# Patient Record
Sex: Male | Born: 1975
Health system: Southern US, Community
[De-identification: ages and names within clinical notes are randomized; demographics above are authoritative.]

## PROBLEM LIST (undated history)

## (undated) DIAGNOSIS — F909 Attention-deficit hyperactivity disorder, unspecified type: Secondary | ICD-10-CM

## (undated) DIAGNOSIS — N529 Male erectile dysfunction, unspecified: Secondary | ICD-10-CM

## (undated) DIAGNOSIS — F341 Dysthymic disorder: Secondary | ICD-10-CM

## (undated) DIAGNOSIS — Z860100 Personal history of colon polyps, unspecified: Secondary | ICD-10-CM

## (undated) DIAGNOSIS — G4730 Sleep apnea, unspecified: Secondary | ICD-10-CM

## (undated) DIAGNOSIS — T7840XA Allergy, unspecified, initial encounter: Secondary | ICD-10-CM

## (undated) HISTORY — DX: Attention-deficit hyperactivity disorder, unspecified type: F90.9

## (undated) HISTORY — DX: Allergy, unspecified, initial encounter: T78.40XA

## (undated) HISTORY — DX: Personal history of colon polyps, unspecified: Z86.0100

## (undated) HISTORY — PX: UMBILICAL HERNIA REPAIR: SHX196

## (undated) HISTORY — DX: Sleep apnea, unspecified: G47.30

## (undated) HISTORY — DX: Male erectile dysfunction, unspecified: N52.9

## (undated) HISTORY — DX: Personal history of colonic polyps: Z86.010

## (undated) HISTORY — PX: HERNIA REPAIR: SHX51

## (undated) HISTORY — DX: Dysthymic disorder: F34.1

---

## 2005-09-05 ENCOUNTER — Encounter: Admission: RE | Admit: 2005-09-05 | Discharge: 2005-09-05 | Payer: Self-pay | Admitting: Emergency Medicine

## 2006-10-08 ENCOUNTER — Emergency Department (HOSPITAL_COMMUNITY): Admission: EM | Admit: 2006-10-08 | Discharge: 2006-10-08 | Payer: Self-pay | Admitting: Emergency Medicine

## 2006-10-15 ENCOUNTER — Ambulatory Visit: Payer: Self-pay | Admitting: Internal Medicine

## 2006-11-29 ENCOUNTER — Encounter (INDEPENDENT_AMBULATORY_CARE_PROVIDER_SITE_OTHER): Payer: Self-pay | Admitting: Specialist

## 2006-11-29 ENCOUNTER — Ambulatory Visit: Payer: Self-pay | Admitting: Internal Medicine

## 2006-11-29 HISTORY — PX: COLONOSCOPY: SHX174

## 2007-01-08 ENCOUNTER — Ambulatory Visit: Payer: Self-pay | Admitting: Internal Medicine

## 2009-10-21 ENCOUNTER — Emergency Department (HOSPITAL_COMMUNITY): Admission: EM | Admit: 2009-10-21 | Discharge: 2009-10-21 | Payer: Self-pay | Admitting: Family Medicine

## 2009-12-27 ENCOUNTER — Ambulatory Visit: Payer: Self-pay | Admitting: Internal Medicine

## 2009-12-27 ENCOUNTER — Telehealth: Payer: Self-pay | Admitting: Internal Medicine

## 2009-12-27 DIAGNOSIS — K625 Hemorrhage of anus and rectum: Secondary | ICD-10-CM | POA: Insufficient documentation

## 2009-12-27 DIAGNOSIS — K648 Other hemorrhoids: Secondary | ICD-10-CM | POA: Insufficient documentation

## 2010-12-08 NOTE — Progress Notes (Signed)
Summary: triage   Phone Note Call from Patient Call back at Work Phone [phone]   Caller: Patient Call For: [name] Summary of Call: Patient has sharp pains in his stomach and blood in his stools for about a week , does not wants to wait unitil next available 3-28 Initial call taken by: Tawni Levy,  December 27, 2009 1:40 PM  Follow-up for Phone Call        PT WITH 1  1/2 WQEEK HX OF ABDOMINAL PAIN AND BLOOD IN STOOL.  pT WILL COME IN TODAY AT 3:00 TO SEE AMY. Follow-up by: Darcey Nora RN, CGRN,  December 27, 2009 1:44 PM

## 2010-12-08 NOTE — Procedures (Signed)
Summary: Colonoscopy   Colonoscopy  Procedure date:  11/29/2006  Findings:      Results: Hemorrhoids.     Location:  [facility].    Patient Name: Carl Beltran, Carl Beltran. MRN:  Procedure Procedures: Colonoscopy CPT: [id number].    with biopsy. CPT: [id number].  Personnel: Endoscopist: Iva Boop, MD, [facility].  Referred By: Reuben Likes, MD.  Exam Location: Exam performed in Outpatient Clinic. Outpatient  Patient Consent: Procedure, Alternatives, Risks and Benefits discussed, consent obtained, from patient. Consent was obtained by the RN.  Indications  Abnormal Exams, Studies: CT scan, abnormal.  History  Current Medications: Patient is not currently taking Coumadin.  Allergies: No known allergies.  Comments: Inflammatory changes of terminal ileum on CT. Long-standing diagnosis of IBS. ? IBD based upon CT Pre-Exam Physical: Performed Nov 29, 2006. Cardio-pulmonary exam, Rectal exam, HEENT exam , Abdominal exam, Mental status exam WNL.  Comments: Pt. history reviewed/updated, physical exam performed prior to initiation of sedation? YES Exam Exam: Extent of exam reached: Terminal Ileum, extent intended: Terminal Ileum.  The cecum was identified by appendiceal orifice and IC valve. Patient position: on left side. Time to Cecum: 00:02:40. Time for Withdrawl: 00:06:20. Colon retroflexion performed. Images taken. ASA Classification: I. Tolerance: excellent.  Monitoring: Pulse and BP monitoring, Oximetry used. Supplemental O2 given.  Colon Prep Used MiraLax for colon prep. Prep results: good.  Sedation Meds: Patient assessed and found to be appropriate for moderate (conscious) sedation. Fentanyl 75 mcg. given IV. Versed 8 mg. given IV.  Findings NORMAL EXAM: Terminal Ileum. Biopsy/Normal Exam taken.  - NORMAL EXAM: Cecum to Sigmoid Colon.  HEMORRHOIDS: Internal. Size: Grade I. ICD9: Hemorrhoids, Internal: 455.0.   Assessment  Diagnoses: 455.0:  Hemorrhoids, Internal.   Comments: 1) NORMAL COLON AND TERMINAL ILEUM 2) SMALL INTERNAL HEMORRHOIDS 3) GOOD PREP   Events  Unplanned Interventions: No intervention was required.  Plans Comments: TRY DICYCLOMINE 20 MG BEFORE MEALS TO PREVENT POST-PRANDIAL DIARRHEA Disposition: After procedure patient sent to recovery. After recovery patient sent home.  Scheduling/Referral: Clinic Visit, to Iva Boop, MD, [facility], 1 MONTH,  Path Letter, to The Patient,    cc:   Roston Grunewald  This report was created from the original endoscopy report, which was reviewed and signed by the above listed endoscopist.

## 2010-12-08 NOTE — Assessment & Plan Note (Signed)
Summary: ABDOMINAL PAIN AND RECTAL BLEEDING/Carl Beltran    History of Present Illness Visit Type: Follow-up Visit Primary GI MD: [name] MD [facility] Primary Provider: n/a Chief Complaint: Abdominal pain x 1 week, rectal bleed significant amount today History of Present Illness:   35  YO MALE KNOWN TO DR. Leone Beltran WITH HX OF IBS. HE UNDERWENT COLONOSCOPY IN 1/08 BECAUSE OF AN ABNORMAL CT SCAN SUGGESTING ILEITIS. COLONOSCOPY WAS NORMAL-BX OF TERMINAL ILEUM NORMAL,HE DID HAVE INTERNAL HEMORRHOIDS. HE COMES IN TODAY STATING HE SOME SOME SHARP ABDOMINAL PAINS LAST WEEK WHICH HAS NOW RESOLVED-THE PAIN WAS SIMILAR TO WHAT HE HAS HAD IN THE PAST. YESTERDAY HE NOTED SOME BRB AND DARK BLOOD ON THE TISSUE WITH A BM. HE HAS SEEN SOME BLOOD WITH EACH BM SINCE. HIS STOOL IS NORMAL,BUT MIXED WITH SOME DARK RED BLOOD. HE DENIES ANY ABDOMINAL PAIN,NO RECTAL PAIN OR DISCOMFORT.HE IS WORRIED BECAUSE  A FRIENDS WIFE HAS DIED FROM COLON CANCER,AND HE THOUGHT HE MIGHT NEED ANOTHER COLONOSCOPY.   GI Review of Systems    Reports abdominal pain.     Location of  Abdominal pain: lower abdomen.    Denies acid reflux, belching, bloating, chest pain, dysphagia with liquids, dysphagia with solids, heartburn, loss of appetite, nausea, vomiting, vomiting blood, weight loss, and  weight gain.      Reports black tarry stools and  rectal bleeding.     Denies anal fissure, change in bowel habit, constipation, diarrhea, diverticulosis, fecal incontinence, heme positive stool, hemorrhoids, irritable bowel syndrome, jaundice, light color stool, liver problems, and  rectal pain. Preventive Screening-Counseling & Management  Alcohol-Tobacco     Smoking Status: quit  Caffeine-Diet-Exercise     Does Patient Exercise: yes      Drug Use:  no.      Current Medications (verified): 1)  None  Allergies (verified): No Known Drug Allergies  Past History:  Past Medical History: IBS INTERNAL HEMORRHOIDS  Past Surgical  History: Hernia Surgery ABDOMINAL WALL AS CHILD  Family History: No FH of Colon Cancer:  Social History: Patient is a former smoker.  Alcohol Use - yes Illicit Drug Use - no Patient gets regular exercise. Smoking Status:  quit Drug Use:  no Does Patient Exercise:  yes  Review of Systems       The patient complains of allergy/sinus, back pain, and headaches-new.  The patient denies anemia, anxiety-new, arthritis/joint pain, blood in urine, breast changes/lumps, change in vision, confusion, cough, coughing up blood, depression-new, fainting, fatigue, fever, hearing problems, heart murmur, heart rhythm changes, itching, menstrual pain, muscle pains/cramps, night sweats, nosebleeds, pregnancy symptoms, shortness of breath, skin rash, sleeping problems, sore throat, swelling of feet/legs, swollen lymph glands, thirst - excessive , urination - excessive , urination changes/pain, urine leakage, vision changes, and voice change.         ROS OTHERWISE AS IN HPI  Vital Signs:  Patient profile:   35 year old male Height:      70 inches Weight:      216.50 pounds BMI:     31.18 Pulse rate:   80 / minute Pulse rhythm:   regular BP sitting:   90 / 60  (left arm) Cuff size:   regular  Vitals Entered By: June McMurray CMA Duncan Dull) (December 27, 2009 2:51 PM)  Physical Exam  General:  Well developed, well nourished, no acute distress. Head:  Normocephalic and atraumatic. Eyes:  PERRLA, no icterus. Lungs:  Clear throughout to auscultation. Heart:  Regular rate and rhythm; no murmurs,  rubs,  or bruits. Abdomen:  SOFT, NONTENDER, NO MASS OR HSM,BS+ Rectal:  PT DECLINED Extremities:  No clubbing, cyanosis, edema or deformities noted. Neurologic:  Alert and  oriented x4;  grossly normal neurologically. Psych:  Alert and cooperative. Normal mood and affect.   Impression & Recommendations:  Problem # 1:  RECTAL BLEEDING (ICD-569.3) Assessment New 35 YO MALE WITH ONE DAY HX OF RECTAL  BLEEDING-MOST CONSISTENT WITH HEMORRHOIDAL BLEEDING. HE HAD A NEGATIVE COLONOSCOPY  3 YEARS AGO.  DISCUSSED WITH PT,HE DID NOT WANT TO UNDERGO RECTAL EXAM TODAY;WILL START ANUSOL HC SUPP at bedtime X ONE WEEK THEN AS NEEDED FOLLOW UP WITH DR. Leone Beltran IN 2-3 WEEKS,IF BLEEDING PERSISTS WILL CONSIDER FURTHER WORKUP.  Problem # 2:  IRRITABLE BOWEL SYNDROME (ICD-564.1) Assessment: Comment Only  Patient Instructions: 1)  We sent a perscription for Anusol HC Suppositories to [facility] 2)  Hemorrhoids brochure given.  3)  We made you a follow up appointment  with Dr. Leone Beltran for 01-13-10 at 2:30 PM.  4)  The medication list was reviewed and reconciled.  All changed / newly prescribed medications were explained.  A complete medication list was provided to the patient / caregiver. Prescriptions: ANUSOL-HC 25 MG SUPP (HYDROCORTISONE ACETATE) Use 1 suppository at bedtime for 1 week then as needed  #10 x 1   Entered by:   Lowry Ram NCMA   Authorized by:   Sammuel Cooper PA-c   Signed by:   Lowry Ram NCMA on 12/27/2009   Method used:   Electronically to        [facility] (retail)       [address]       [place], [place]  [address]       Ph: [phone]       Fax: [phone]   RxID:   [id number]

## 2011-03-24 NOTE — Assessment & Plan Note (Signed)
HEALTHCARE                         GASTROENTEROLOGY OFFICE NOTE   Carl Beltran, Carl Beltran                      MRN:          [id number]  DATE:10/15/2006                            DOB:          11/08/1975    CONSULTATION:   REASON FOR CONSULTATION:  Abdominal pain, abnormal ileum on CT scan.   ASSESSMENT:  This is a 35 year old white man that had developed acute  abdominal pain on December 3.  This is after returning from a trip to  the [place].  It was a very sharp, shooting infraumbilical pain and  bilateral lower quadrant pain.  He did have some diarrhea.  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  He had mild hepatomegaly and a spleen the upper  limits of normal.  There was some sigmoid wall thickening and a small  amount of pelvic free fluid as well.  The sigmoid wall thickening was  thought perhaps due to underdistention.  He also has years of history of  intermittent abdominal pain diagnosed as irritable bowel syndrome at the  [facility], [place], when he was a youngster.  He did have a  double hernia repair as a child.  Initially they thought this was  related to adhesions but eventually was diagnosed as irritable bowel  syndrome.  There is no chronic diarrhea or chronic pain other than this  episodic intermittent pain that has happened over the years.  This is  one of the worst episodes, and it had been some time since he had this.   I think this could be Crohn disease.  It could have been an infectious  process or perhaps some process related to adhesions or perhaps a  partial obstructive-type picture, though we did not have dilated loops  of bowel reported.   PLAN:  1. He has been started on empiric prednisone, and we will taper that      over the next 6 days.  2. Schedule colonoscopy to  investigate.  He cannot really do this      until the first of the year, and we will set this up.  Hopefully,      he will do well in the interim, but he understands there is a risk      of recurrent problems.  3. Further plans pending that, a small bowel series versus capsule      endoscopy could be indicated as well.   HISTORY:  This is a pleasant 35 year old white man with problems as  described above.  He had been doing reasonably well until his trip to  the [place] and then when he returned, he was awakened from sleep with  acute severe abdominal pain that lasted for hours, so he presented to  the emergency room as described above.  He did not have a fever there,  his white count was 12.9, his hemoglobin was 15.  He did have somewhat  of a left shift.  Platelets were normal.  His urinalysis was  negative.  CT findings as above.  He does not have bleeding or chronic diarrhea.  He went to Dr. Lorenz Coaster, who described prednisone, and he is really almost  back to normal at this point.   MEDICATIONS:  1. Prednisone 4 pills a day, not sure of the exact dose.  2. Vicodin p.r.n.  3. Tylenol p.r.n.   DRUG ALLERGIES:  None known.   PAST MEDICAL HISTORY:  1. Double hernia repair as a child.  2. Irritable bowel syndrome as described above.   FAMILY HISTORY:  No inflammatory bowel disease or GI problems.   SOCIAL HISTORY:  He is married and lives with his wife.  He is a  Teacher, early years/pre.  He has one son.  Quit smoking 8 years ago,  social alcohol.   REVIEW OF SYSTEMS:  Otherwise negative.   PHYSICAL EXAMINATION:  GENERAL:  A young white man, obese, in no acute  distress.  VITAL SIGNS:  Height 5 feet 11 inches, 217 pounds.  Blood pressure  112/68, pulse 85 and regular.  HEENT:  Eyes anicteric.  ENT:  Normal mouth and pharynx.  NECK:  Supple without thyromegaly or mass.  CHEST:  Clear.  HEART:  S1, S2, no murmurs or gallops.  ABDOMEN:  Obese, soft, nontender, no  organomegaly or mass.  He does have  some mild pressure sensation when I depress the right lower quadrant,  and that is where most of his pain was the other night.  RECTAL:  Deferred.  LYMPHATIC:  No neck, supraclavicular or groin adenopathy.  EXTREMITIES:  No peripheral edema.  SKIN:  Warm and dry, no rash.  NEUROLOGIC/PSYCHIATRIC:  Alert and oriented x3.   I appreciate the opportunity to care for this patient.     Iva Boop, MD,FACG  Electronically Signed    CEG/MedQ  DD: 10/15/2006  DT: 10/16/2006  Job #: [id number]   cc:   Reuben Likes, M.D.

## 2011-03-24 NOTE — Assessment & Plan Note (Signed)
[facility]                         GASTROENTEROLOGY OFFICE NOTE   DENSEL, KRONICK                      MRN:          [id number]  DATE:01/08/2007                            DOB:          02-Jun-1976    CHIEF COMPLAINT:  Followup of diarrhea, abnormal terminal ileum.   His colonoscopy was normal.  Small bowel biopsies were normal.  He has  had no recurrent diarrhea.  He has not needed dicyclomine.  I am  assuming he had some sort of infectious problem.  He does have a history  of underlying irritable bowel syndrome as well.   Today's weight 221 pounds, pulse 88, blood pressure 108/68.   ASSESSMENT:  Previous abnormal ileum on CT scan, probably related to an  infection that has come and gone with underlying irritable bowel  syndrome, his problems are quiescent at this time.   PLAN:  Follow up as needed.     Iva Boop, MD,FACG  Electronically Signed    CEG/MedQ  DD: 01/08/2007  DT: 01/08/2007  Job #: [id number]   cc:   Reuben Likes, M.D.

## 2012-01-15 ENCOUNTER — Emergency Department (HOSPITAL_COMMUNITY): Payer: BC Managed Care – PPO

## 2012-01-15 ENCOUNTER — Emergency Department (HOSPITAL_COMMUNITY)
Admission: EM | Admit: 2012-01-15 | Discharge: 2012-01-15 | Disposition: A | Discharge status: Home Health | Payer: BC Managed Care – PPO | Attending: Emergency Medicine | Admitting: Emergency Medicine

## 2012-01-15 ENCOUNTER — Encounter (HOSPITAL_COMMUNITY): Payer: Self-pay | Admitting: Emergency Medicine

## 2012-01-15 DIAGNOSIS — E669 Obesity, unspecified: Secondary | ICD-10-CM | POA: Insufficient documentation

## 2012-01-15 DIAGNOSIS — R1110 Vomiting, unspecified: Secondary | ICD-10-CM | POA: Insufficient documentation

## 2012-01-15 DIAGNOSIS — R10811 Right upper quadrant abdominal tenderness: Secondary | ICD-10-CM | POA: Insufficient documentation

## 2012-01-15 DIAGNOSIS — R197 Diarrhea, unspecified: Secondary | ICD-10-CM | POA: Insufficient documentation

## 2012-01-15 DIAGNOSIS — R109 Unspecified abdominal pain: Secondary | ICD-10-CM | POA: Insufficient documentation

## 2012-01-15 LAB — LIPASE, BLOOD: Lipase: 24 U/L (ref 11–59)

## 2012-01-15 LAB — CBC
HCT: 45.8 % (ref 39.0–52.0)
Hemoglobin: 16.5 g/dL (ref 13.0–17.0)
MCHC: 36 g/dL (ref 30.0–36.0)
Platelets: 357 10*3/uL (ref 150–400)
RDW: 12.6 % (ref 11.5–15.5)

## 2012-01-15 LAB — URINALYSIS, ROUTINE W REFLEX MICROSCOPIC
Glucose, UA: NEGATIVE mg/dL
Hgb urine dipstick: NEGATIVE
Leukocytes, UA: NEGATIVE
Nitrite: NEGATIVE
Specific Gravity, Urine: 1.028 (ref 1.005–1.030)
Urobilinogen, UA: 1 mg/dL (ref 0.0–1.0)
pH: 7.5 (ref 5.0–8.0)

## 2012-01-15 LAB — COMPREHENSIVE METABOLIC PANEL
ALT: 38 U/L (ref 0–53)
Albumin: 4.4 g/dL (ref 3.5–5.2)
Alkaline Phosphatase: 86 U/L (ref 39–117)
BUN: 13 mg/dL (ref 6–23)
Chloride: 100 mEq/L (ref 96–112)
GFR calc Af Amer: >90 mL/min (ref >90)
Glucose, Bld: 168 mg/dL — ABNORMAL HIGH (ref 70–99)
Potassium: 4.6 mEq/L (ref 3.5–5.1)
Sodium: 139 mEq/L (ref 135–145)
Total Bilirubin: 0.6 mg/dL (ref 0.3–1.2)
Total Protein: 8.2 g/dL (ref 6.0–8.3)

## 2012-01-15 LAB — DIFFERENTIAL
Basophils Absolute: 0 10*3/uL (ref 0.0–0.1)
Basophils Relative: 0 % (ref 0–1)
Eosinophils Absolute: 0.1 10*3/uL (ref 0.0–0.7)
Lymphocytes Relative: 15 % (ref 12–46)
Lymphs Abs: 2.4 10*3/uL (ref 0.7–4.0)
Monocytes Relative: 5 % (ref 3–12)
Neutro Abs: 12.4 10*3/uL — ABNORMAL HIGH (ref 1.7–7.7)
Neutrophils Relative %: 79 % — ABNORMAL HIGH (ref 43–77)

## 2012-01-15 MED ORDER — SODIUM CHLORIDE 0.9 % IV BOLUS (SEPSIS)
1000.0000 mL | Freq: Once | INTRAVENOUS | Status: AC
  Administered 2012-01-15: 1000 mL via INTRAVENOUS

## 2012-01-15 NOTE — ED Notes (Signed)
Pt remains in u/s at this time

## 2012-01-15 NOTE — Discharge Instructions (Signed)
Abdominal Pain Many things can cause belly (abdominal) pain. Most times, the belly pain is not dangerous. The amount of belly pain does not tell how serious the problem may be. Many cases of belly pain can be watched and treated at home. HOME CARE   Do not take medicines that help you go poop (laxatives) unless told to by your doctor.   Only take medicine as told by your doctor.   Eat or drink as told by your doctor. Your doctor will tell you if you should be on a special diet.  GET HELP RIGHT AWAY IF:   The pain does not go away.   You have a fever.   You keep throwing up (vomiting).   The pain changes and is only in the right or left part of the belly.   You have bloody or tarry looking poop.  MAKE SURE YOU:   Understand these instructions.   Will watch your condition.   Will get help right away if you are not doing well or get worse.  Document Released: 04/10/2008 Document Revised: 10/12/2011 Document Reviewed: 11/08/2009 [facility] Patient Information 2012 [place], [place].  Stay on clear liquids for the next 24 hours. Return if your pain worsens within the next 8-12 hours. Appendicitis is a remote possibility, though doubtful. Your blood sugar is high today at 168. It should be rechecked within the next week after fasting overnight. You may be borderline diabetic. Return if her condition worsens for any reason

## 2012-01-15 NOTE — ED Notes (Signed)
PT. REPORTS RIGHT ABDOMINAL PAIN WITH VOMITTING AND DIARRHEA ONSET 7 PM LAST NIGHT.

## 2012-01-15 NOTE — ED Notes (Signed)
Patient currently resting quietly in bed; no respiratory or acute distress noted.  Patient updated on plan of care; informed patient that we are currently waiting to be transported to ultrasound.  Patient has no other questions or concerns at this time; patient denies need for pain medication at this time.  Will continue to monitor.

## 2012-01-15 NOTE — ED Provider Notes (Signed)
History     CSN: [id number]  Arrival date & time 01/15/12  0451   First MD Initiated Contact with Patient 01/15/12 0536      Chief Complaint  Patient presents with  . Abdominal Pain    (Consider location/radiation/quality/duration/timing/severity/associated sxs/prior treatment) HPI  History reviewed. No pertinent past medical history. Complains of right-sided abdominal pain onset 7 PM yesterday pain is intermittent lasting 2 minutes at a time. Patient has had one episode of vomiting and one episode of diarrhea since onset of the pain. He is presently asymptomatic. Pain is sharp nonradiating. Pain similar pain he had approximately 20 years ago when he was diagnosed with gastroenteritis. No treatment prior to coming here no other complaint. Nothing makes symptoms better or worse. No other associated symptoms. Pain is severe when it occurs. Pain-free now Past Surgical History  Procedure Date  . Hernia repair     No family history on file.  History  Substance Use Topics  . Smoking status: Never Smoker   . Smokeless tobacco: Not on file  . Alcohol Use: Yes      Review of Systems  Constitutional: Negative.   HENT: Negative.   Respiratory: Negative.   Cardiovascular: Negative.   Gastrointestinal: Positive for vomiting, abdominal pain and diarrhea.  Musculoskeletal: Negative.   Skin: Negative.   Neurological: Negative.   Hematological: Negative.   Psychiatric/Behavioral: Negative.   All other systems reviewed and are negative.    Allergies  Review of patient's allergies indicates no known allergies.  Home Medications  No current outpatient prescriptions on file.  BP 120/71  Pulse 113  Temp(Src) 98.4 F (36.9 C) (Axillary)  Resp 20  SpO2 100%  Physical Exam  Nursing note and vitals reviewed. Constitutional: He appears well-developed and well-nourished.  HENT:  Head: Normocephalic and atraumatic.  Eyes: Conjunctivae are normal. Pupils are equal, round, and  reactive to light.  Neck: Neck supple. No tracheal deviation present. No thyromegaly present.  Cardiovascular: Normal rate and regular rhythm.   No murmur heard. Pulmonary/Chest: Effort normal and breath sounds normal.  Abdominal: Soft. Bowel sounds are normal. He exhibits no distension and no mass. There is tenderness. There is no rebound and no guarding.       Mildly obese, right upper quadrant tenderness negative Murphy sign  Genitourinary: Penis normal.  Musculoskeletal: Normal range of motion. He exhibits no edema and no tenderness.  Neurological: He is alert. Coordination normal.  Skin: Skin is warm and dry. No rash noted.  Psychiatric: He has a normal mood and affect.    ED Course  Procedures (including critical care time)  Labs Reviewed - No data to display No results found.   No diagnosis found. 8:50 AM patient asymptomatic pain-free.   MDM  Abdominal pain felt to be nonspecific in etiology Plan clear liquid diet for 24 hours Blood sugar recheck Diagnosis #1 nonspecific abdominal pain #2 nausea and vomiting and diarrhea #3 hyperglycemia        Doug Sou, MD 01/15/12 [phone]

## 2012-01-15 NOTE — ED Notes (Signed)
EDP in room with patient.  Patient reports pain is more in the RUQ with palpation, not RLQ.

## 2012-01-15 NOTE — ED Notes (Signed)
Lab at bedside

## 2012-01-15 NOTE — ED Notes (Signed)
Patient presents with RLQ pain starting approximately 1900 last night.  Patient also complaining of nausea, vomiting and diarrhea since then.  Patient has vomiting x 2 and diarrhea x 1.  Patient denies any nausea during assessment but states if he sits up or moves around, the nausea returns.  Patient rating pain at a 1 right now but states it comes and goes and has gotten to a 10 at certain points.  Patient also has rebound tenderness in the RLQ.

## 2012-05-15 ENCOUNTER — Other Ambulatory Visit: Payer: Self-pay | Admitting: Dermatology

## 2012-06-28 ENCOUNTER — Other Ambulatory Visit: Payer: Self-pay | Admitting: Dermatology

## 2013-07-28 ENCOUNTER — Encounter: Payer: Self-pay | Admitting: Internal Medicine

## 2013-07-28 ENCOUNTER — Ambulatory Visit (INDEPENDENT_AMBULATORY_CARE_PROVIDER_SITE_OTHER): Payer: BC Managed Care – PPO | Admitting: Internal Medicine

## 2013-07-28 VITALS — BP 113/74 | HR 72 | Temp 97.9°F | Ht 69.9 in | Wt 217.4 lb

## 2013-07-28 DIAGNOSIS — N529 Male erectile dysfunction, unspecified: Secondary | ICD-10-CM

## 2013-07-28 DIAGNOSIS — Z13220 Encounter for screening for lipoid disorders: Secondary | ICD-10-CM

## 2013-07-28 LAB — BASIC METABOLIC PANEL
BUN: 11 mg/dL (ref 6–23)
CO2: 30 mEq/L (ref 19–32)
Calcium: 9.5 mg/dL (ref 8.4–10.5)
Chloride: 104 mEq/L (ref 96–112)
Creatinine, Ser: 0.9 mg/dL (ref 0.4–1.5)
GFR: 95.88 mL/min (ref >60.00)
Glucose, Bld: 98 mg/dL (ref 70–99)
Potassium: 4.3 mEq/L (ref 3.5–5.1)
Sodium: 138 mEq/L (ref 135–145)

## 2013-07-28 LAB — LIPID PANEL
Cholesterol: 118 mg/dL (ref 0–200)
LDL Cholesterol: 57 mg/dL (ref 0–99)
Total CHOL/HDL Ratio: 3
Triglycerides: 131 mg/dL (ref 0.0–149.0)
VLDL: 26.2 mg/dL (ref 0.0–40.0)

## 2013-07-28 LAB — TSH: TSH: 0.97 u[IU]/mL (ref 0.35–5.50)

## 2013-07-28 LAB — HEMOGLOBIN A1C: Hgb A1c MFr Bld: 5.4 % (ref 4.6–6.5)

## 2013-07-28 MED ORDER — VARDENAFIL HCL 20 MG PO TABS: 10.0000 mg | ORAL_TABLET | Freq: Every day | ORAL | Status: DC | PRN

## 2013-07-28 NOTE — Progress Notes (Signed)
  Subjective:    Patient ID: Carl Beltran, male    DOB: [birth year], 37 y.o.   MRN: [id number]  HPI New patient, here to get established, also reports several months history of ED. Having decreased quality of erections, they don't last as long. 4 months ago he decided to exercise more and has lost 25 pounds thinking that would help but it didn't. A few years ago, he tried Viagra with no side effects and it did help.  Past Medical History  Diagnosis Date  . Erectile dysfunction    Past Surgical History  Procedure Laterality Date  . Hernia repair      umbilical, at age 89 y/o   History   Social History  . Marital Status: Married    Spouse Name: N/A    Number of Children: 2  . Years of Education: N/A   Occupational History  . IT    Social History Main Topics  . Smoking status: Never Smoker   . Smokeless tobacco: Never Used  . Alcohol Use: Yes     Comment: socially   . Drug Use: No  . Sexual Activity: Not on file   Other Topics Concern  . Not on file   Social History Narrative  . No narrative on file   Family History  Problem Relation Age of Onset  . Colon cancer Neg Hx   . Prostate cancer Neg Hx   . Diabetes Neg Hx   . CAD Other     GF    Review of Systems Libido is normal. Not taking any new medication. Stress is at baseline, he has a very good relationship with his wife. No chest pain, shortness of breath, palpitations or claudication.     Objective:   Physical Exam BP 113/74  Pulse 72  Temp(Src) 97.9 F (36.6 C)  Ht 5' 9.9" (1.775 m)  Wt 217 lb 6.4 oz (98.612 kg)  BMI 31.3 kg/m2  SpO2 99%  General -- alert, well-developed, NAD.  Neck --no thyromegaly , normal carotid pulse  Lungs -- normal respiratory effort, no intercostal retractions, no accessory muscle use, and normal breath sounds.  Heart-- normal rate, regular rhythm, no murmur.  Abdomen-- Not distended, good bowel sounds,soft, non-tender. GU-- Scrotal contents normal, penis normal, no  inguinal hernias. Extremities-- no pretibial edema bilaterally   Psych-- Cognition and judgment appear intact. Cooperative with normal attention span and concentration. No anxious appearing , no depressed appearing.        Assessment & Plan:

## 2013-07-28 NOTE — Assessment & Plan Note (Signed)
ED of unclear etiology, no evidence of cardiovascular disease by history and physical. Will screen for high cholesterol, hypogonadism, diabetes. In the past he tried Viagra, good results. Will try Levitra at this time, samples provided #4 tablets and a prescription. If at the end of the trial he prefers Viagra he will let me know. Recommend to come back in 6 months for a formal physical

## 2013-07-29 LAB — TESTOSTERONE, FREE, TOTAL, SHBG
Sex Hormone Binding: 22 nmol/L (ref 13–71)
Testosterone, Free: 80.4 pg/mL (ref 47.0–244.0)
Testosterone-% Free: 2.5 % (ref 1.6–2.9)
Testosterone: 327 ng/dL (ref 300–890)

## 2013-07-31 ENCOUNTER — Encounter: Payer: Self-pay | Admitting: General Practice

## 2013-08-22 ENCOUNTER — Ambulatory Visit (INDEPENDENT_AMBULATORY_CARE_PROVIDER_SITE_OTHER): Payer: BC Managed Care – PPO | Admitting: Internal Medicine

## 2013-08-22 ENCOUNTER — Encounter: Payer: Self-pay | Admitting: Internal Medicine

## 2013-08-22 VITALS — BP 115/76 | HR 62 | Temp 97.8°F | Wt 212.0 lb

## 2013-08-22 DIAGNOSIS — J069 Acute upper respiratory infection, unspecified: Secondary | ICD-10-CM

## 2013-08-22 MED ORDER — AZITHROMYCIN 250 MG PO TABS: ORAL_TABLET | ORAL | Status: DC

## 2013-08-22 NOTE — Progress Notes (Signed)
  Subjective:    Patient ID: Carl Beltran, male    DOB: 14-Apr-1976, 37 y.o.   MRN: [id number]  HPI  acute visit 2 days h/o R>L  Ear congestion, feeling clogged  Past Medical History  Diagnosis Date  . Erectile dysfunction    Past Surgical History  Procedure Laterality Date  . Hernia repair      umbilical, at age 74 y/o   History   Social History  . Marital Status: Married    Spouse Name: N/A    Number of Children: 2  . Years of Education: N/A   Occupational History  . IT    Social History Main Topics  . Smoking status: Never Smoker   . Smokeless tobacco: Never Used  . Alcohol Use: Yes     Comment: socially   . Drug Use: No  . Sexual Activity: Not on file   Other Topics Concern  . Not on file   Social History Narrative  . No narrative on file     Review of Systems No fever or chills Mild cough with a very small amount of clear sputum. No sick contacts or travel outside [place] Not much nasal discharge    Objective:   Physical Exam  BP 115/76  Pulse 62  Temp(Src) 97.8 F (36.6 C)  Wt 212 lb (96.163 kg)  BMI 30.52 kg/m2  SpO2 98% General -- alert, well-developed, NAD.   HEENT-- Not pale. TMs bulge but not red B, throat symmetric, no redness or discharge. Face symmetric, sinuses not tender to palpation. Nose congested  congested.  Lungs -- normal respiratory effort, no intercostal retractions, no accessory muscle use, and normal breath sounds.  Heart-- normal rate, regular rhythm, no murmur.   Extremities-- no pretibial edema bilaterally  Neurologic--  alert & oriented X3. Speech normal, gait normal, strength normal in all extremities.   Psych-- Cognition and judgment appear intact. Cooperative with normal attention span and concentration. No anxious appearing , no depressed appearing.      Assessment & Plan:  URI, Requests antibiotics. See instructions.

## 2013-08-22 NOTE — Patient Instructions (Signed)
Rest, fluids , tylenol For cough, take Mucinex DM twice a day as needed  For congestion use Qnsal 2 spray on each side of the nose daily until you feel better Take the antibiotic as prescribed  (zpack) if no better in few days Call if no better by next week Call anytime if the symptoms are severe

## 2013-08-23 ENCOUNTER — Encounter: Payer: Self-pay | Admitting: Internal Medicine

## 2013-12-25 ENCOUNTER — Other Ambulatory Visit: Payer: Self-pay | Admitting: Internal Medicine

## 2014-01-27 ENCOUNTER — Telehealth: Payer: Self-pay | Admitting: *Deleted

## 2014-01-27 NOTE — Telephone Encounter (Signed)
Prior authorization paperwork for Levitra 20mg  tablets faxed to [facility]. Awaiting response. JG//CMA

## 2014-01-28 ENCOUNTER — Telehealth: Payer: Self-pay | Admitting: *Deleted

## 2014-01-28 NOTE — Telephone Encounter (Signed)
Medication approved from 01/27/2014 through 11/05/2038. Reference number: [id number]. Approval letter sent to batch to be scanned. JG//CMA

## 2014-01-28 NOTE — Telephone Encounter (Signed)
Error

## 2014-03-13 ENCOUNTER — Encounter: Payer: Self-pay | Admitting: Internal Medicine

## 2014-03-13 ENCOUNTER — Ambulatory Visit (INDEPENDENT_AMBULATORY_CARE_PROVIDER_SITE_OTHER): Payer: BC Managed Care – PPO | Admitting: Internal Medicine

## 2014-03-13 VITALS — BP 98/61 | HR 59 | Temp 98.2°F | Wt 205.6 lb

## 2014-03-13 DIAGNOSIS — N529 Male erectile dysfunction, unspecified: Secondary | ICD-10-CM

## 2014-03-13 MED ORDER — TADALAFIL 2.5 MG PO TABS: 1.0000 | ORAL_TABLET | Freq: Every day | ORAL | Status: DC

## 2014-03-13 MED ORDER — VARDENAFIL HCL 20 MG PO TABS: ORAL_TABLET | ORAL | Status: DC

## 2014-03-13 NOTE — Patient Instructions (Signed)
Take cialis daily or Levitra as needed. Do not mix them.   Next visit is for a physical exam by 07-2014, fasting Please make an appointment

## 2014-03-13 NOTE — Progress Notes (Signed)
Pre visit review using our clinic review tool, if applicable. No additional management support is needed unless otherwise documented below in the visit note. 

## 2014-03-13 NOTE — Progress Notes (Signed)
   Subjective:    Patient ID: Carl Beltran, male    DOB: [birth year], 38 y.o.   MRN: [id number]  DOS:  03/13/2014 Type of  visit: Followup from previous visit He is currently taking Levitra with great results , no apparent side effects. Like to try Cialis daily.   ROS Feeling well  Past Medical History  Diagnosis Date  . Erectile dysfunction     Past Surgical History  Procedure Laterality Date  . Hernia repair      umbilical, at age 89 y/o    History   Social History  . Marital Status: Married    Spouse Name: N/A    Number of Children: 2  . Years of Education: N/A   Occupational History  . IT    Social History Main Topics  . Smoking status: Never Smoker   . Smokeless tobacco: Never Used  . Alcohol Use: Yes     Comment: socially   . Drug Use: No  . Sexual Activity: Not on file   Other Topics Concern  . Not on file   Social History Narrative  . No narrative on file        Medication List       This list is accurate as of: 03/13/14 11:59 PM.  Always use your most recent med list.               loratadine 10 MG tablet  Commonly known as:  CLARITIN  Take 10 mg by mouth daily as needed for allergies.     Tadalafil 2.5 MG Tabs  Commonly known as:  CIALIS  Take 1 tablet (2.5 mg total) by mouth daily.     vardenafil 20 MG tablet  Commonly known as:  LEVITRA  TAKE 1/2 TO 1 TABLET BY MOUTH DAILY AS NEEDED FOR ERECTILE DYSFUNCTION           Objective:   Physical Exam BP 98/61  Pulse 59  Temp(Src) 98.2 F (36.8 C) (Oral)  Wt 205 lb 9.6 oz (93.26 kg)  SpO2 99% General -- alert, well-developed, NAD.   Psych-- Cognition and judgment appear intact. Cooperative with normal attention span and concentration. No anxious or depressed appearing.        Assessment & Plan:

## 2014-03-13 NOTE — Assessment & Plan Note (Signed)
Doing very well w/ levitra prn but would like to try Cialis daily. I don't see any contraindication.  We agreed to give him 2 prescription, one for qd  Cialis ,  if it doesn't like it he could go back on Levitra as needed. He knows not to mix both of them.

## 2014-07-20 ENCOUNTER — Encounter: Payer: Self-pay | Admitting: Internal Medicine

## 2014-07-20 ENCOUNTER — Ambulatory Visit (INDEPENDENT_AMBULATORY_CARE_PROVIDER_SITE_OTHER): Payer: BC Managed Care – PPO | Admitting: Internal Medicine

## 2014-07-20 VITALS — BP 110/74 | HR 72 | Temp 97.8°F | Wt 205.0 lb

## 2014-07-20 DIAGNOSIS — N528 Other male erectile dysfunction: Secondary | ICD-10-CM

## 2014-07-20 DIAGNOSIS — N529 Male erectile dysfunction, unspecified: Secondary | ICD-10-CM

## 2014-07-20 DIAGNOSIS — J40 Bronchitis, not specified as acute or chronic: Secondary | ICD-10-CM

## 2014-07-20 MED ORDER — AZITHROMYCIN 250 MG PO TABS: ORAL_TABLET | ORAL | Status: DC

## 2014-07-20 MED ORDER — TADALAFIL 2.5 MG PO TABS: 1.0000 | ORAL_TABLET | Freq: Every day | ORAL | Status: DC

## 2014-07-20 NOTE — Assessment & Plan Note (Addendum)
Symptoms responded well to Cialis or Levitra, having a hard time filling the rx for  cialis which he prefers. Per chart review, that medication was approved by his insurance. A new prescription is sent, patient is encouraged to try to refill it again

## 2014-07-20 NOTE — Progress Notes (Signed)
Pre visit review using our clinic review tool, if applicable. No additional management support is needed unless otherwise documented below in the visit note. 

## 2014-07-20 NOTE — Progress Notes (Signed)
   Subjective:    Patient ID: Carl Beltran, male    DOB: 12/27/1975, 38 y.o.   MRN: [id number]  DOS:  07/20/2014 Type of visit - description : acute  Interval history: Sx started 2-3 days ago, much worse x 1 day His 2 children are sick, one w/ walking pnm. Pt has persistent cough, Minimal sputum production, color?Marland Kitchen Taking OTCs for cough. Has a headache only when she coughs.   ROS Denies fever, some chills. No sinus pain or congestion. No nausea, vomiting or muscle aches.   Past Medical History  Diagnosis Date  . Erectile dysfunction     Past Surgical History  Procedure Laterality Date  . Hernia repair      umbilical, at age 19 y/o    History   Social History  . Marital Status: Married    Spouse Name: N/A    Number of Children: 2  . Years of Education: N/A   Occupational History  . IT    Social History Main Topics  . Smoking status: Never Smoker   . Smokeless tobacco: Never Used  . Alcohol Use: Yes     Comment: socially   . Drug Use: No  . Sexual Activity: Not on file   Other Topics Concern  . Not on file   Social History Narrative  . No narrative on file        Medication List       This list is accurate as of: 07/20/14 11:59 PM.  Always use your most recent med list.               azithromycin 250 MG tablet  Commonly known as:  ZITHROMAX Z-PAK  2 tabs the first day, then 1 tab a day x 4 days     loratadine 10 MG tablet  Commonly known as:  CLARITIN  Take 10 mg by mouth daily as needed for allergies.     Tadalafil 2.5 MG Tabs  Commonly known as:  CIALIS  Take 1 tablet (2.5 mg total) by mouth daily.     vardenafil 20 MG tablet  Commonly known as:  LEVITRA  TAKE 1/2 TO 1 TABLET BY MOUTH DAILY AS NEEDED FOR ERECTILE DYSFUNCTION           Objective:   Physical Exam BP 110/74  Pulse 72  Temp(Src) 97.8 F (36.6 C) (Oral)  Wt 205 lb (92.987 kg)  SpO2 97% General -- alert, well-developed, NAD.  HEENT-- Not pale. TMs normal,  throat symmetric, no redness or discharge. Face symmetric, sinuses not tender to palpation. Nose slt congested. Lungs -- normal respiratory effort, no intercostal retractions, no accessory muscle use, and few ronchi w/ cough, no wheezing   Heart-- normal rate, regular rhythm, no murmur.   Extremities-- no pretibial edema bilaterally  Neurologic--  alert & oriented X3. Speech normal, gait appropriate for age, strength symmetric and appropriate for age.  Psych-- Cognition and judgment appear intact. Cooperative with normal attention span and concentration. No anxious or depressed appearing.      Assessment & Plan:   Symptoms consistent with bronchitis, see instructions

## 2014-07-20 NOTE — Patient Instructions (Signed)
Rest, fluids , tylenol For cough, take Mucinex DM twice a day as needed  For congestion use OTC Nasocort: 2 nasal sprays on each side of the nose daily until you feel better   Take the antibiotic as prescribed  (zithromax ) Call if not gradually better over the next 3-4 days Call anytime if the symptoms are severe

## 2014-07-22 ENCOUNTER — Telehealth: Payer: Self-pay | Admitting: *Deleted

## 2014-07-22 NOTE — Telephone Encounter (Signed)
Received Prior authorization for Cialis 2.5mg  via fax from [facility].  Form filled out on [email or web address].  Awaiting approval.//AB/CMA

## 2014-07-24 NOTE — Telephone Encounter (Signed)
Received letter that the prior authorization for Cialis was approved via fax from [facility].  Informed [facility] of the approval, and they will notify the pt of the approval.  Approval letter sent for scanning.//AB/CMA

## 2014-07-27 ENCOUNTER — Telehealth: Payer: Self-pay | Admitting: Internal Medicine

## 2014-07-27 DIAGNOSIS — N529 Male erectile dysfunction, unspecified: Secondary | ICD-10-CM

## 2014-07-27 MED ORDER — SILDENAFIL CITRATE 100 MG PO TABS: 50.0000 mg | ORAL_TABLET | Freq: Every day | ORAL | Status: DC | PRN

## 2014-07-27 NOTE — Telephone Encounter (Signed)
Please advise. For further antibiotics and cough medication does Pt need to be seen again?

## 2014-07-27 NOTE — Telephone Encounter (Signed)
Viagra sent to [facility].

## 2014-07-27 NOTE — Telephone Encounter (Signed)
Spoke with Pt and gave instructions, informed Pt that Viagra was sent to [facility].

## 2014-07-27 NOTE — Telephone Encounter (Signed)
At this point, I don't recommend more antibiotics. If he is not improving within the next week please come back to the office. Continue with Mucinex DM And Nasacort when necessary Okay to send a prescription for Viagra 100 mg one tablet daily when necessary #6 and 5 refills

## 2014-07-27 NOTE — Telephone Encounter (Signed)
Pt also states that dr. Larose Kells had informed him to let him know if the cilias was not working, so that he could change it . Pt would like to change the the cilias to regular viagra.

## 2014-07-27 NOTE — Telephone Encounter (Signed)
Caller name: [name], [name] Relation to pt: self  Call back number: [phone] Pharmacy: [facility] [address] [place] [address] [phone]   Reason for call:   Pt requesting refill azithromycin (ZITHROMAX Z-PAK) 250 MG tablet and RX for Viagra and RX for cough syrup.

## 2014-08-26 ENCOUNTER — Encounter: Payer: Self-pay | Admitting: Medical

## 2014-08-26 ENCOUNTER — Ambulatory Visit (HOSPITAL_BASED_OUTPATIENT_CLINIC_OR_DEPARTMENT_OTHER)
Admission: RE | Admit: 2014-08-26 | Discharge: 2014-08-26 | Disposition: A | Discharge status: Home / Self Care | Payer: BC Managed Care – PPO | Source: Ambulatory Visit | Attending: Medical | Admitting: Medical

## 2014-08-26 ENCOUNTER — Ambulatory Visit (INDEPENDENT_AMBULATORY_CARE_PROVIDER_SITE_OTHER): Payer: BC Managed Care – PPO | Admitting: Medical

## 2014-08-26 VITALS — BP 115/73 | HR 72 | Temp 98.4°F | Ht 65.1 in | Wt 209.2 lb

## 2014-08-26 DIAGNOSIS — R05 Cough: Secondary | ICD-10-CM | POA: Insufficient documentation

## 2014-08-26 DIAGNOSIS — R059 Cough, unspecified: Secondary | ICD-10-CM

## 2014-08-26 DIAGNOSIS — R062 Wheezing: Secondary | ICD-10-CM

## 2014-08-26 MED ORDER — HYDROCODONE-HOMATROPINE 5-1.5 MG/5ML PO SYRP: 5.0000 mL | ORAL_SOLUTION | Freq: Three times a day (TID) | ORAL | Status: DC | PRN

## 2014-08-26 MED ORDER — BECLOMETHASONE DIPROPIONATE 40 MCG/ACT IN AERS: 2.0000 | INHALATION_SPRAY | Freq: Two times a day (BID) | RESPIRATORY_TRACT | Status: DC

## 2014-08-26 MED ORDER — PREDNISONE 20 MG PO TABS: 20.0000 mg | ORAL_TABLET | Freq: Three times a day (TID) | ORAL | Status: DC

## 2014-08-26 MED ORDER — ALBUTEROL SULFATE HFA 108 (90 BASE) MCG/ACT IN AERS: 2.0000 | INHALATION_SPRAY | Freq: Four times a day (QID) | RESPIRATORY_TRACT | Status: DC | PRN

## 2014-08-26 NOTE — Patient Instructions (Signed)
For your wheezing, I am going to prescribe 3 days brief oral prednisone, qvar inhaler and albuterol inhaler. Also will rx hydrocodone based cough syrup.  I do want you to get cxr today to rule out infectious process. Currently cough and wheeze may be only from reactive airways.  Follow up in 7-10 days or as needed.

## 2014-08-26 NOTE — Progress Notes (Signed)
Pre visit review using our clinic review tool, if applicable. No additional management support is needed unless otherwise documented below in the visit note. 

## 2014-08-26 NOTE — Assessment & Plan Note (Addendum)
Wheezing and cough that occurred after bronchitis. Prednisone, qvar, albuterol and hydromet rx. Cxr today to see if infection present. Currently no antibiotic given.

## 2014-08-26 NOTE — Progress Notes (Signed)
   Subjective:    Patient ID: Carl Beltran, male    DOB: 05-20-1976, 38 y.o.   MRN: [id number]  HPI  See below     Review of Systems    see below  Objective:   Physical Exam    See below    Assessment & Plan:  See below

## 2014-08-26 NOTE — Progress Notes (Signed)
Subjective:    Patient ID: Carl Beltran, male    DOB: 1976-03-11, 38 y.o.   MRN: [id number]  HPI  2 months pt got zpack for bronchitis. He feels like he felt better overall. However pt states he got some wheezing and coughing and that has never went away. Wheezing mostly at night. Non smoker. No history of asthma. No fever or chills recently.  Past Medical History  Diagnosis Date  . Erectile dysfunction     History   Social History  . Marital Status: Married    Spouse Name: N/A    Number of Children: 2  . Years of Education: N/A   Occupational History  . IT    Social History Main Topics  . Smoking status: Never Smoker   . Smokeless tobacco: Never Used  . Alcohol Use: Yes     Comment: socially   . Drug Use: No  . Sexual Activity: Not on file   Other Topics Concern  . Not on file   Social History Narrative  . No narrative on file    Past Surgical History  Procedure Laterality Date  . Hernia repair      umbilical, at age 66 y/o    Family History  Problem Relation Age of Onset  . Colon cancer Neg Hx   . Prostate cancer Neg Hx   . Diabetes Neg Hx   . CAD Other     GF    No Known Allergies  Current Outpatient Prescriptions on File Prior to Visit  Medication Sig Dispense Refill  . loratadine (CLARITIN) 10 MG tablet Take 10 mg by mouth daily as needed for allergies.      . sildenafil (VIAGRA) 100 MG tablet Take 0.5-1 tablets (50-100 mg total) by mouth daily as needed for erectile dysfunction.  6 tablet  5  . vardenafil (LEVITRA) 20 MG tablet TAKE 1/2 TO 1 TABLET BY MOUTH DAILY AS NEEDED FOR ERECTILE DYSFUNCTION  10 tablet  3  . azithromycin (ZITHROMAX Z-PAK) 250 MG tablet 2 tabs the first day, then 1 tab a day x 4 days  6 tablet  0   No current facility-administered medications on file prior to visit.    BP 115/73  Pulse 72  Temp(Src) 98.4 F (36.9 C) (Oral)  Ht 5' 5.1" (1.654 m)  Wt 209 lb 3.2 oz (94.892 kg)  BMI 34.69 kg/m2  SpO2  96%      Review of Systems  Constitutional: Negative for fever, chills and fatigue.  HENT: Negative for congestion, ear pain, nosebleeds, postnasal drip, rhinorrhea, sinus pressure, sore throat and trouble swallowing.   Respiratory: Positive for cough, shortness of breath and wheezing.   Cardiovascular: Negative for chest pain and palpitations.  Gastrointestinal: Negative.   Musculoskeletal: Negative for back pain.  Neurological: Negative for dizziness, tremors, syncope, facial asymmetry, weakness, light-headedness and headaches.  Hematological: Negative for adenopathy. Does not bruise/bleed easily.  Psychiatric/Behavioral: Negative.        Objective:   Physical Exam  General  Mental Status - Alert. General Appearance - Well groomed. Not in acute distress.  Skin Rashes- No Rashes.  HEENT Head- Normal. Ear Auditory Canal - Left- Normal. Right - Normal.Tympanic Membrane- Left- Normal. Right- Normal. Eye Sclera/Conjunctiva- Left- Normal. Right- Normal. Nose & Sinuses Nasal Mucosa- Left- Not Boggy or Congested. Right- Not Boggy or Congested. Mouth & Throat Lips: Upper Lip- Normal: no dryness, cracking, pallor, cyanosis, or vesicular eruption. Lower Lip-Normal: no dryness, cracking, pallor, cyanosis or vesicular  eruption. Buccal Mucosa- Bilateral- No Aphthous ulcers. Oropharynx- No Discharge or Erythema. Tonsils: Characteristics- Bilateral- No Erythema or Congestion. Size/Enlargement- Bilateral- No enlargement. Discharge- bilateral-None.  Neck Neck- Supple. No Masses.   Chest and Lung Exam Auscultation: Breath Sounds:-clear even and unlabored but mild shallow  Cardiovascular Auscultation:Rythm- Regular, rate and rhythm. Murmurs & Other Heart Sounds:Ausculatation of the heart reveal- No Murmurs.  Lymphatic Head & Neck General Head & Neck Lymphatics: Bilateral: Description- No Localized lymphadenopathy.         Assessment & Plan:

## 2014-08-28 ENCOUNTER — Telehealth: Payer: Self-pay | Admitting: Internal Medicine

## 2014-08-28 NOTE — Telephone Encounter (Signed)
Caller name: Koua Relation to pt: Call back [facility]  Reason for call: pt saw Percell Miller on 10/21.   Pt states he lost his Rx predniSONE (DELTASONE) 20 MG tablet after taking for 1 day.  He wants another called in.

## 2014-08-31 MED ORDER — PREDNISONE 20 MG PO TABS: 20.0000 mg | ORAL_TABLET | Freq: Three times a day (TID) | ORAL | Status: DC

## 2014-08-31 NOTE — Telephone Encounter (Signed)
Pt lost rx. Will provide with same short course prednisone 20 mg #9 tabs 1 tab  tid x 3 days.

## 2014-08-31 NOTE — Telephone Encounter (Signed)
Patient notified medications were re-ordered.

## 2014-10-09 ENCOUNTER — Encounter: Payer: BC Managed Care – PPO | Admitting: Internal Medicine

## 2014-10-28 ENCOUNTER — Ambulatory Visit (INDEPENDENT_AMBULATORY_CARE_PROVIDER_SITE_OTHER): Payer: BLUE CROSS/BLUE SHIELD | Admitting: Internal Medicine

## 2014-10-28 ENCOUNTER — Encounter: Payer: Self-pay | Admitting: Internal Medicine

## 2014-10-28 VITALS — BP 113/72 | HR 77 | Temp 97.9°F | Ht 65.0 in | Wt 206.4 lb

## 2014-10-28 DIAGNOSIS — Z23 Encounter for immunization: Secondary | ICD-10-CM | POA: Diagnosis not present

## 2014-10-28 DIAGNOSIS — Z0000 Encounter for general adult medical examination without abnormal findings: Secondary | ICD-10-CM | POA: Diagnosis not present

## 2014-10-28 LAB — CBC WITH DIFFERENTIAL/PLATELET
Basophils Absolute: 0.1 10*3/uL (ref 0.0–0.1)
Basophils Relative: 0.9 % (ref 0.0–3.0)
EOS PCT: 2.2 % (ref 0.0–5.0)
Eosinophils Absolute: 0.1 10*3/uL (ref 0.0–0.7)
HCT: 45.1 % (ref 39.0–52.0)
Hemoglobin: 15.1 g/dL (ref 13.0–17.0)
Lymphocytes Relative: 38.8 % (ref 12.0–46.0)
Lymphs Abs: 2.6 10*3/uL (ref 0.7–4.0)
MCHC: 33.5 g/dL (ref 30.0–36.0)
MCV: 87.3 fl (ref 78.0–100.0)
MONOS PCT: 7.9 % (ref 3.0–12.0)
Monocytes Absolute: 0.5 10*3/uL (ref 0.1–1.0)
NEUTROS PCT: 50.2 % (ref 43.0–77.0)
Neutro Abs: 3.3 10*3/uL (ref 1.4–7.7)
PLATELETS: 326 10*3/uL (ref 150.0–400.0)
RBC: 5.17 Mil/uL (ref 4.22–5.81)
RDW: 12.8 % (ref 11.5–15.5)
WBC: 6.7 10*3/uL (ref 4.0–10.5)

## 2014-10-28 LAB — HEMOGLOBIN A1C: Hgb A1c MFr Bld: 5.4 % (ref 4.6–6.5)

## 2014-10-28 LAB — TSH: TSH: 1.47 u[IU]/mL (ref 0.35–4.50)

## 2014-10-28 NOTE — Progress Notes (Signed)
   Subjective:    Patient ID: Carl Beltran, male    DOB: Dec 19, 1975, 38 y.o.   MRN: [id number]  DOS:  10/28/2014 Type of visit - description : cpx Interval history: Recovering from sinusitis, on antibiotics. Occasionally has pain at the right foot - the arch-.  ROS Denies chest pain or difficulty breathing No nausea, vomiting, diarrhea or blood in the stools. No anxiety or depression No dysuria, gross hematuria difficulty urinating.   Past Medical History  Diagnosis Date  . Erectile dysfunction     Past Surgical History  Procedure Laterality Date  . Hernia repair      umbilical, at age 52 y/o    History   Social History  . Marital Status: Married    Spouse Name: N/A    Number of Children: 2  . Years of Education: N/A   Occupational History  . IT    Social History Main Topics  . Smoking status: Never Smoker   . Smokeless tobacco: Never Used  . Alcohol Use: Yes     Comment: socially   . Drug Use: No  . Sexual Activity: Not on file   Other Topics Concern  . Not on file   Social History Narrative   Household-- pt , wife , 2 children 9-6 y/0     Family History  Problem Relation Age of Onset  . Colon cancer Neg Hx   . Prostate cancer Neg Hx   . Diabetes Neg Hx   . CAD Other     GF       Medication List       This list is accurate as of: 10/28/14 11:59 PM.  Always use your most recent med list.               sildenafil 100 MG tablet  Commonly known as:  VIAGRA  Take 0.5-1 tablets (50-100 mg total) by mouth daily as needed for erectile dysfunction.     vardenafil 20 MG tablet  Commonly known as:  LEVITRA  TAKE 1/2 TO 1 TABLET BY MOUTH DAILY AS NEEDED FOR ERECTILE DYSFUNCTION           Objective:   Physical Exam BP 113/72 mmHg  Pulse 77  Temp(Src) 97.9 F (36.6 C) (Oral)  Ht 5\' 5"  (1.651 m)  Wt 206 lb 6 oz (93.611 kg)  BMI 34.34 kg/m2  SpO2 100% General -- alert, well-developed, NAD.  Neck --no thyromegaly  Lungs -- normal  respiratory effort, no intercostal retractions, no accessory muscle use, and normal breath sounds.  Heart-- normal rate, regular rhythm, no murmur.  Abdomen-- Not distended, good bowel sounds,soft, non-tender. Extremities-- no pretibial edema bilaterally ; feet normal to inspection and palpation Neurologic--  alert & oriented X3. Speech normal, gait appropriate for age, strength symmetric and appropriate for age.  Psych-- Cognition and judgment appear intact. Cooperative with normal attention span and concentration. No anxious or depressed appearing.       Assessment & Plan:

## 2014-10-28 NOTE — Progress Notes (Signed)
Pre visit review using our clinic review tool, if applicable. No additional management support is needed unless otherwise documented below in the visit note. 

## 2014-10-28 NOTE — Assessment & Plan Note (Addendum)
Td today Declined flu shot  Had a Colonoscopy in 2008 for rectal bleeding, now asymptomatic Diet and exercise discussed Labs Return to the office one year  Also--Complain of arch pain, right foot. Recommend stretching, call if not better

## 2014-10-28 NOTE — Patient Instructions (Signed)
Get your blood work before you leave    Please come back to the office in 1 year  for a physical exam. Come back fasting

## 2014-10-29 LAB — COMPREHENSIVE METABOLIC PANEL
ALT: 24 U/L (ref 0–53)
AST: 25 U/L (ref 0–37)
Albumin: 4.6 g/dL (ref 3.5–5.2)
Alkaline Phosphatase: 69 U/L (ref 39–117)
BUN: 14 mg/dL (ref 6–23)
CO2: 25 mEq/L (ref 19–32)
Calcium: 9.4 mg/dL (ref 8.4–10.5)
Chloride: 104 mEq/L (ref 96–112)
Creatinine, Ser: 0.9 mg/dL (ref 0.4–1.5)
GFR: 104.14 mL/min (ref >60.00)
GLUCOSE: 88 mg/dL (ref 70–99)
Potassium: 4.5 mEq/L (ref 3.5–5.1)
SODIUM: 140 meq/L (ref 135–145)
TOTAL PROTEIN: 7.9 g/dL (ref 6.0–8.3)
Total Bilirubin: 0.7 mg/dL (ref 0.2–1.2)

## 2014-10-29 LAB — LIPID PANEL
Cholesterol: 164 mg/dL (ref 0–200)
HDL: 28.3 mg/dL — AB (ref >39.00)
NonHDL: 135.7
Total CHOL/HDL Ratio: 6
Triglycerides: 208 mg/dL — ABNORMAL HIGH (ref 0.0–149.0)
VLDL: 41.6 mg/dL — AB (ref 0.0–40.0)

## 2014-10-29 LAB — LDL CHOLESTEROL, DIRECT: LDL DIRECT: 77.5 mg/dL

## 2015-04-29 ENCOUNTER — Other Ambulatory Visit: Payer: Self-pay | Admitting: Internal Medicine

## 2015-04-29 NOTE — Telephone Encounter (Signed)
Ok 10 and 3 RF

## 2015-04-29 NOTE — Telephone Encounter (Signed)
Pt is requesting refill on Levitra.  Last OV: 10/28/2014  Last Fill: 03/13/2014 #10 3RF  Okay to refill?

## 2015-06-16 ENCOUNTER — Encounter: Payer: Self-pay | Admitting: Internal Medicine

## 2015-06-24 ENCOUNTER — Telehealth: Payer: Self-pay | Admitting: Internal Medicine

## 2015-06-24 NOTE — Telephone Encounter (Signed)
Received call from [facility] on behalf of [place] was faxed 06/23/15 to but sent to wrong #.  Gave corrected fax #.

## 2015-08-23 ENCOUNTER — Encounter: Payer: Self-pay | Admitting: Internal Medicine

## 2015-08-23 ENCOUNTER — Ambulatory Visit (INDEPENDENT_AMBULATORY_CARE_PROVIDER_SITE_OTHER): Payer: BLUE CROSS/BLUE SHIELD | Admitting: Internal Medicine

## 2015-08-23 VITALS — BP 102/56 | HR 83 | Temp 98.0°F | Ht 65.0 in | Wt 213.5 lb

## 2015-08-23 DIAGNOSIS — L739 Follicular disorder, unspecified: Secondary | ICD-10-CM | POA: Diagnosis not present

## 2015-08-23 DIAGNOSIS — R21 Rash and other nonspecific skin eruption: Secondary | ICD-10-CM

## 2015-08-23 MED ORDER — CEPHALEXIN 500 MG PO CAPS: 500.0000 mg | ORAL_CAPSULE | Freq: Four times a day (QID) | ORAL | Status: DC

## 2015-08-23 NOTE — Progress Notes (Signed)
   Subjective:    Patient ID: TARVARES LANT, male    DOB: [birth year], 39 y.o.   MRN: [id number]  DOS:  08/23/2015 Type of visit - description : Acute Interval history: Developed a rash, symmetric, 3 days ago. No itching or hurting. Has put on OTC steroid cream without much help   Review of Systems  No fever chills Has not been in a  a hot tub lately. No systemic symptoms. Feels well.  Past Medical History  Diagnosis Date  . Erectile dysfunction     Past Surgical History  Procedure Laterality Date  . Hernia repair      umbilical, at age 58 y/o    Social History   Social History  . Marital Status: Married    Spouse Name: N/A  . Number of Children: 2  . Years of Education: N/A   Occupational History  . IT    Social History Main Topics  . Smoking status: Never Smoker   . Smokeless tobacco: Never Used  . Alcohol Use: Yes     Comment: socially   . Drug Use: No  . Sexual Activity: Not on file   Other Topics Concern  . Not on file   Social History Narrative   Household-- pt , wife , 2 children 9-6 y/0        Medication List       This list is accurate as of: 08/23/15  5:35 PM.  Always use your most recent med list.               cephALEXin 500 MG capsule  Commonly known as:  KEFLEX  Take 1 capsule (500 mg total) by mouth 4 (four) times daily.     sildenafil 100 MG tablet  Commonly known as:  VIAGRA  Take 0.5-1 tablets (50-100 mg total) by mouth daily as needed for erectile dysfunction.     vardenafil 20 MG tablet  Commonly known as:  LEVITRA  Take 0.5-1 tablets (10-20 mg total) by mouth daily as needed for erectile dysfunction.           Objective:   Physical Exam  Constitutional: He is oriented to person, place, and time. He appears well-developed and well-nourished. No distress.  Neurological: He is alert and oriented to person, place, and time.  Skin: Skin is warm and dry. He is not diaphoretic.     Psychiatric: He has a normal mood  and affect. His behavior is normal. Judgment and thought content normal.   BP 102/56 mmHg  Pulse 83  Temp(Src) 98 F (36.7 C) (Oral)  Ht 5\' 5"  (1.651 m)  Wt 213 lb 8 oz (96.843 kg)  BMI 35.53 kg/m2  SpO2 94%     Assessment & Plan:   Rash: Suspect folliculitis, prescribed Keflex, if not better he will let me know. May need doxycycline and/or  topical antibiotics

## 2015-08-23 NOTE — Progress Notes (Signed)
Pre visit review using our clinic review tool, if applicable. No additional management support is needed unless otherwise documented below in the visit note. 

## 2015-08-23 NOTE — Patient Instructions (Signed)
Take antibiotics as prescribed, call   if not gradually improving.   Schedule you physical at your convenience    Folliculitis Folliculitis is redness, soreness, and swelling (inflammation) of the hair follicles. This condition can occur anywhere on the body. People with weakened immune systems, diabetes, or obesity have a greater risk of getting folliculitis. CAUSES  Bacterial infection. This is the most common cause.  Fungal infection.  Viral infection.  Contact with certain chemicals, especially oils and tars. Long-term folliculitis can result from bacteria that live in the nostrils. The bacteria may trigger multiple outbreaks of folliculitis over time. SYMPTOMS Folliculitis most commonly occurs on the scalp, thighs, legs, back, buttocks, and areas where hair is shaved frequently. An early sign of folliculitis is a small, white or yellow, pus-filled, itchy lesion (pustule). These lesions appear on a red, inflamed follicle. They are usually less than 0.2 inches (5 mm) wide. When there is an infection of the follicle that goes deeper, it becomes a boil or furuncle. A group of closely packed boils creates a larger lesion (carbuncle). Carbuncles tend to occur in hairy, sweaty areas of the body. DIAGNOSIS  Your caregiver can usually tell what is wrong by doing a physical exam. A sample may be taken from one of the lesions and tested in a lab. This can help determine what is causing your folliculitis. TREATMENT  Treatment may include:  Applying warm compresses to the affected areas.  Taking antibiotic medicines orally or applying them to the skin.  Draining the lesions if they contain a large amount of pus or fluid.  Laser hair removal for cases of long-lasting folliculitis. This helps to prevent regrowth of the hair. HOME CARE INSTRUCTIONS  Apply warm compresses to the affected areas as directed by your caregiver.  If antibiotics are prescribed, take them as directed. Finish them  even if you start to feel better.  You may take over-the-counter medicines to relieve itching.  Do not shave irritated skin.  Follow up with your caregiver as directed. SEEK IMMEDIATE MEDICAL CARE IF:   You have increasing redness, swelling, or pain in the affected area.  You have a fever. MAKE SURE YOU:  Understand these instructions.  Will watch your condition.  Will get help right away if you are not doing well or get worse.   This information is not intended to replace advice given to you by your health care provider. Make sure you discuss any questions you have with your health care provider.   Document Released: 01/01/2002 Document Revised: 11/13/2014 Document Reviewed: 01/23/2012 Elsevier Interactive Patient Education [employer].

## 2015-09-06 ENCOUNTER — Telehealth: Payer: Self-pay | Admitting: Internal Medicine

## 2015-09-06 MED ORDER — DOXYCYCLINE HYCLATE 100 MG PO TBEC: 100.0000 mg | DELAYED_RELEASE_TABLET | Freq: Two times a day (BID) | ORAL | Status: DC

## 2015-09-06 MED ORDER — CLINDAMYCIN PHOSPHATE 1 % EX GEL: Freq: Two times a day (BID) | CUTANEOUS | Status: DC

## 2015-09-06 NOTE — Telephone Encounter (Signed)
advise patient, will switch antibiotics: Doxycycline Also, I sent an antibiotic lotion.  See prescriptions.

## 2015-09-06 NOTE — Telephone Encounter (Signed)
Spoke with Pt, informed him to take Doxycycline 1 tablet by mouth twice daily, and use antibiotic cream twice daily. Informed Pt to call if not improving. Pt verbalized understanding.

## 2015-09-06 NOTE — Telephone Encounter (Signed)
Please advise 

## 2015-09-06 NOTE — Telephone Encounter (Signed)
Relation to TR:RNHA Call back [place]: [id number] - [place], [place] [phone] (Phone) [phone] (Fax)        Reason for call:  Patient states rash never went away requesting a refill antibiotics cephALEXin (KEFLEX) 500 MG capsule

## 2015-10-26 ENCOUNTER — Ambulatory Visit: Payer: BLUE CROSS/BLUE SHIELD | Admitting: Internal Medicine

## 2015-10-26 ENCOUNTER — Telehealth: Payer: Self-pay | Admitting: Internal Medicine

## 2015-10-26 NOTE — Telephone Encounter (Signed)
Pt no show today 9:30am for acute appt scheduled 12/19. Pt left VM today 12/20 8:44am stating he had a work conflict, called pt to reschedule and he states he is going into a meeting and will call back to reschedule, charge or no charge?

## 2015-10-26 NOTE — Telephone Encounter (Signed)
First no show. No charge.

## 2015-10-27 ENCOUNTER — Ambulatory Visit (INDEPENDENT_AMBULATORY_CARE_PROVIDER_SITE_OTHER): Payer: BLUE CROSS/BLUE SHIELD | Admitting: Internal Medicine

## 2015-10-27 ENCOUNTER — Encounter: Payer: Self-pay | Admitting: Internal Medicine

## 2015-10-27 VITALS — BP 108/66 | HR 63 | Temp 97.5°F | Ht 65.0 in | Wt 221.1 lb

## 2015-10-27 DIAGNOSIS — S6992XA Unspecified injury of left wrist, hand and finger(s), initial encounter: Secondary | ICD-10-CM

## 2015-10-27 DIAGNOSIS — Z09 Encounter for follow-up examination after completed treatment for conditions other than malignant neoplasm: Secondary | ICD-10-CM | POA: Insufficient documentation

## 2015-10-27 NOTE — Patient Instructions (Signed)
Use a brace  (carpal tunnel splinter), use it x 10 days  IBUPROFEN (Advil or Motrin) 200 mg 2 tablets every 6 hours as needed for pain.  Always take it with food because may cause gastritis and ulcers.  If you notice nausea, stomach pain, change in the color of stools --->  Stop the medicine and let us know   ICE for 2-3 days  Call if no better

## 2015-10-27 NOTE — Progress Notes (Signed)
   Subjective:    Patient ID: Carl Beltran, male    DOB: November 21, 1975, 39 y.o.   MRN: [id number]  DOS:  10/27/2015 Type of visit - description :  acuteInterval history:  Acute onset of pain, L wrist, 3 days ago when weight lifting, located at the cubital side, radiates slt proximal and distally. No impact injury, no swelling  Review of Systems No other injuries, no neck or back pain  Past Medical History  Diagnosis Date  . Erectile dysfunction     Past Surgical History  Procedure Laterality Date  . Hernia repair      umbilical, at age 17 y/o    Social History   Social History  . Marital Status: Married    Spouse Name: N/A  . Number of Children: 2  . Years of Education: N/A   Occupational History  . IT    Social History Main Topics  . Smoking status: Never Smoker   . Smokeless tobacco: Never Used  . Alcohol Use: Yes     Comment: socially   . Drug Use: No  . Sexual Activity: Not on file   Other Topics Concern  . Not on file   Social History Narrative   Household-- pt , wife , 2 children 9-6 y/0        Medication List       This list is accurate as of: 10/27/15  5:07 PM.  Always use your most recent med list.               clindamycin 1 % gel  Commonly known as:  CLINDAGEL  Apply topically 2 (two) times daily.     doxycycline 100 MG EC tablet  Commonly known as:  DORYX  Take 1 tablet (100 mg total) by mouth 2 (two) times daily.     sildenafil 100 MG tablet  Commonly known as:  VIAGRA  Take 0.5-1 tablets (50-100 mg total) by mouth daily as needed for erectile dysfunction.     vardenafil 20 MG tablet  Commonly known as:  LEVITRA  Take 0.5-1 tablets (10-20 mg total) by mouth daily as needed for erectile dysfunction.           Objective:   Physical Exam  Musculoskeletal:       Arms:  BP 108/66 mmHg  Pulse 63  Temp(Src) 97.5 F (36.4 C) (Oral)  Ht 5\' 5"  (1.651 m)  Wt 221 lb 2 oz (100.302 kg)  BMI 36.80 kg/m2  SpO2 97% General:     Well developed, well nourished . NAD.  MSK: Wrsit: normal to inspection, palpation, normal vascular and motor exam. C/o some pain w/ passive wrist motion  Skin: Not pale. Not jaundice Neurologic:  alert & oriented X3.  Speech normal, gait appropriate for age and unassisted Psych--  Cognition and judgment appear intact.  Cooperative with normal attention span and concentration.  Behavior appropriate. No anxious or depressed appearing.      Assessment & Plan:   Assessment H/o ED  PLAN: Left wrist injury, ligament sprain?. Recommend conservative treatment, C instructions. Call if not better.

## 2015-10-27 NOTE — Assessment & Plan Note (Signed)
Left wrist injury, ligament sprain?. Recommend conservative treatment, C instructions. Call if not better.

## 2015-10-27 NOTE — Progress Notes (Signed)
Pre visit review using our clinic review tool, if applicable. No additional management support is needed unless otherwise documented below in the visit note. 

## 2015-11-07 DIAGNOSIS — F909 Attention-deficit hyperactivity disorder, unspecified type: Secondary | ICD-10-CM

## 2015-11-07 DIAGNOSIS — F341 Dysthymic disorder: Secondary | ICD-10-CM

## 2015-11-07 HISTORY — DX: Attention-deficit hyperactivity disorder, unspecified type: F90.9

## 2015-11-07 HISTORY — DX: Dysthymic disorder: F34.1

## 2015-11-08 ENCOUNTER — Other Ambulatory Visit: Payer: Self-pay | Admitting: Internal Medicine

## 2015-12-02 ENCOUNTER — Telehealth: Payer: Self-pay | Admitting: Internal Medicine

## 2015-12-02 DIAGNOSIS — M25532 Pain in left wrist: Secondary | ICD-10-CM

## 2015-12-02 NOTE — Telephone Encounter (Signed)
Relation to [id number] Call back Gorman   Reason for call:  Patient was last seen 10/27/2015 and states right wrist pain didn't improve and would like to be referred out. Please advise

## 2015-12-02 NOTE — Telephone Encounter (Signed)
Please advise 

## 2015-12-02 NOTE — Telephone Encounter (Signed)
Referral placed.

## 2015-12-02 NOTE — Telephone Encounter (Signed)
Please refer the patient to orthopedic- hand

## 2015-12-10 ENCOUNTER — Encounter: Payer: BLUE CROSS/BLUE SHIELD | Admitting: Internal Medicine

## 2015-12-14 ENCOUNTER — Other Ambulatory Visit: Payer: Self-pay | Admitting: Orthopedic Surgery

## 2015-12-14 DIAGNOSIS — S63592A Other specified sprain of left wrist, initial encounter: Secondary | ICD-10-CM

## 2015-12-21 ENCOUNTER — Ambulatory Visit
Admission: RE | Admit: 2015-12-21 | Discharge: 2015-12-21 | Disposition: A | Discharge status: Home / Self Care | Payer: BLUE CROSS/BLUE SHIELD | Source: Ambulatory Visit | Attending: Orthopedic Surgery | Admitting: Orthopedic Surgery

## 2015-12-21 DIAGNOSIS — S63592A Other specified sprain of left wrist, initial encounter: Secondary | ICD-10-CM

## 2015-12-21 MED ORDER — IOHEXOL 180 MG/ML  SOLN
1.5000 mL | Freq: Once | INTRAMUSCULAR | Status: AC | PRN
  Administered 2015-12-21: 1.5 mL via INTRA_ARTICULAR

## 2016-02-07 ENCOUNTER — Encounter: Payer: Self-pay | Admitting: Internal Medicine

## 2016-02-07 ENCOUNTER — Ambulatory Visit (INDEPENDENT_AMBULATORY_CARE_PROVIDER_SITE_OTHER): Payer: BLUE CROSS/BLUE SHIELD | Admitting: Internal Medicine

## 2016-02-07 VITALS — BP 102/68 | HR 67 | Temp 97.6°F | Ht 65.0 in | Wt 226.5 lb

## 2016-02-07 DIAGNOSIS — F909 Attention-deficit hyperactivity disorder, unspecified type: Secondary | ICD-10-CM | POA: Diagnosis not present

## 2016-02-07 DIAGNOSIS — F988 Other specified behavioral and emotional disorders with onset usually occurring in childhood and adolescence: Secondary | ICD-10-CM

## 2016-02-07 DIAGNOSIS — Z09 Encounter for follow-up examination after completed treatment for conditions other than malignant neoplasm: Secondary | ICD-10-CM

## 2016-02-07 NOTE — Patient Instructions (Signed)
Please make an appointment to one of our counselors for confirmation of ADD  Call me once the evaluation is done   You are due for a physical at your convenience

## 2016-02-07 NOTE — Progress Notes (Signed)
   Subjective:    Patient ID: Carl Beltran, male    DOB: 01-Dec-1975, 40 y.o.   MRN: [id number]  DOS:  02/07/2016 Type of visit - description : Acute visit Interval history:  Patient was diagnosed with ADD when he was in college, at the time was not able to afford medication and consequently was never treated. His son was recently diagnosed with ADD as well. He has noted marked improvement on him w/ meds and pt is now looking for treatment. His symptoms are easily distractibility and impulsiveness.  Review of Systems  denies anxiety per se but he has a very stressful job and it is very difficult for him to relax and that make him somewhat depressed. No major headaches or other problems.  Past Medical History  Diagnosis Date  . Erectile dysfunction     Past Surgical History  Procedure Laterality Date  . Hernia repair      umbilical, at age 57 y/o    Social History   Social History  . Marital Status: Married    Spouse Name: N/A  . Number of Children: 2  . Years of Education: N/A   Occupational History  . IT    Social History Main Topics  . Smoking status: Never Smoker   . Smokeless tobacco: Never Used  . Alcohol Use: Yes     Comment: socially   . Drug Use: No  . Sexual Activity: Not on file   Other Topics Concern  . Not on file   Social History Narrative   Household-- pt , wife , 2 children 10-7 y/0        Medication List       This list is accurate as of: 02/07/16 11:59 PM.  Always use your most recent med list.               sildenafil 100 MG tablet  Commonly known as:  VIAGRA  Take 0.5-1 tablets (50-100 mg total) by mouth daily as needed for erectile dysfunction.           Objective:   Physical Exam BP 102/68 mmHg  Pulse 67  Temp(Src) 97.6 F (36.4 C) (Oral)  Ht 5\' 5"  (1.651 m)  Wt 226 lb 8 oz (102.74 kg)  BMI 37.69 kg/m2  SpO2 95% General:   Well developed, well nourished . NAD.  HEENT:  Normocephalic . Face symmetric,  atraumatic Skin: Not pale. Not jaundice Neurologic:  alert & oriented X3.  Speech normal, gait appropriate for age and unassisted Psych--  Cognition and judgment appear intact.  Cooperative with normal attention span and concentration.  Behavior appropriate. No anxious or depressed appearing.      Assessment & Plan:   Assessment H/o ED  Plan: ADD Patient presents for ADD treatment, he has a long history of such, never treated before. On further questioning he also has some depression but denies anxiety. PHQ 9 = 10 which is moderate depression. We talk about possibly treat depression first and then ADD but he thinks that if the ADD is treated first he will feel much better emotionally. Plan: Refer to counseling for confirmation of the diagnosis, treatment ADD if appropriate.  Has a cpx schedule for 05-2016

## 2016-02-07 NOTE — Progress Notes (Signed)
Pre visit review using our clinic review tool, if applicable. No additional management support is needed unless otherwise documented below in the visit note. 

## 2016-02-08 NOTE — Assessment & Plan Note (Signed)
ADD Patient presents for ADD treatment, he has a long history of such, never treated before. On further questioning he also has some depression but denies anxiety. PHQ 9 = 10 which is moderate depression. We talk about possibly treat depression first and then ADD but he thinks that if the ADD is treated first he will feel much better emotionally. Plan: Refer to counseling for confirmation of the diagnosis, treatment ADD if appropriate.  Has a cpx schedule for 05-2016

## 2016-03-06 ENCOUNTER — Ambulatory Visit (INDEPENDENT_AMBULATORY_CARE_PROVIDER_SITE_OTHER): Payer: BLUE CROSS/BLUE SHIELD | Admitting: Psychology

## 2016-03-06 DIAGNOSIS — F341 Dysthymic disorder: Secondary | ICD-10-CM

## 2016-03-06 DIAGNOSIS — F340 Cyclothymic disorder: Secondary | ICD-10-CM | POA: Diagnosis not present

## 2016-03-06 DIAGNOSIS — F908 Attention-deficit hyperactivity disorder, other type: Secondary | ICD-10-CM | POA: Diagnosis not present

## 2016-03-31 ENCOUNTER — Encounter: Payer: Self-pay | Admitting: Internal Medicine

## 2016-03-31 ENCOUNTER — Ambulatory Visit (INDEPENDENT_AMBULATORY_CARE_PROVIDER_SITE_OTHER): Payer: BLUE CROSS/BLUE SHIELD | Admitting: Psychology

## 2016-03-31 DIAGNOSIS — F341 Dysthymic disorder: Secondary | ICD-10-CM

## 2016-03-31 DIAGNOSIS — F900 Attention-deficit hyperactivity disorder, predominantly inattentive type: Secondary | ICD-10-CM | POA: Diagnosis not present

## 2016-04-21 ENCOUNTER — Telehealth: Payer: Self-pay | Admitting: Internal Medicine

## 2016-04-21 NOTE — Telephone Encounter (Signed)
Called patient to inform him of below. Patient scheduled to come in to see Dr. Larose Kells on Monday.

## 2016-04-21 NOTE — Telephone Encounter (Signed)
°  Relationship to patient: Self Can be reached: [phone] Pharmacy:  [id number] - [place], [address] [place] [phone] (Phone) [phone] (Fax)         Reason for call: Request Rx for ADD medication. States he has seen the counselor and is ready to start the medication

## 2016-04-21 NOTE — Telephone Encounter (Signed)
Pt was informed via [place] on 03/31/2016 that we had received notes from psychiatry and would need to schedule an appt w/ Dr. Larose Kells to discuss Dx and to start medication.

## 2016-04-24 ENCOUNTER — Encounter: Payer: Self-pay | Admitting: Internal Medicine

## 2016-04-24 ENCOUNTER — Ambulatory Visit (INDEPENDENT_AMBULATORY_CARE_PROVIDER_SITE_OTHER): Payer: BLUE CROSS/BLUE SHIELD | Admitting: Internal Medicine

## 2016-04-24 VITALS — BP 108/68 | HR 92 | Temp 97.9°F | Ht 65.0 in | Wt 229.0 lb

## 2016-04-24 DIAGNOSIS — F900 Attention-deficit hyperactivity disorder, predominantly inattentive type: Secondary | ICD-10-CM | POA: Diagnosis not present

## 2016-04-24 DIAGNOSIS — F909 Attention-deficit hyperactivity disorder, unspecified type: Secondary | ICD-10-CM

## 2016-04-24 HISTORY — DX: Attention-deficit hyperactivity disorder, unspecified type: F90.9

## 2016-04-24 MED ORDER — AMPHETAMINE-DEXTROAMPHETAMINE 10 MG PO TABS: 10.0000 mg | ORAL_TABLET | Freq: Two times a day (BID) | ORAL | Status: DC

## 2016-04-24 MED ORDER — ALPRAZOLAM 0.5 MG PO TABS: 0.5000 mg | ORAL_TABLET | Freq: Every day | ORAL | Status: DC | PRN

## 2016-04-24 MED ORDER — VARDENAFIL HCL 20 MG PO TABS: 10.0000 mg | ORAL_TABLET | Freq: Every day | ORAL | Status: DC | PRN

## 2016-04-24 NOTE — Progress Notes (Signed)
Pre visit review using our clinic review tool, if applicable. No additional management support is needed unless otherwise documented below in the visit note. 

## 2016-04-24 NOTE — Patient Instructions (Signed)
Take adderal:   1 tab in the morning Or  1 tab in the morning and one at 1 pm Or 2 tab in the morning  Only as needed, most people don't use it during the weekend   -------------------- Xanax 1/2, 1, 1.5 tablets for flights. Do a trial dose before you fly  -------------------- Next visit in 2 months

## 2016-04-24 NOTE — Progress Notes (Signed)
Subjective:    Patient ID: Carl Beltran, male    DOB: 1976/01/19, 39 y.o.   MRN: [id number]  DOS:  04/24/2016 Type of visit - description : Routine visit Interval history: Since the last visit was evaluated for ADHD by psychology: "He met criteria for ADHD, Inattentive Presentation, as well as Persistent Depressive Disorder. He indicated that he will follow up with you regarding medication options. He also plans to meet with me later this summer for psychotherapy"   Here for the management of ADHD. Also request a refill on Levitra  Review of Systems   Past Medical History  Diagnosis Date  . Erectile dysfunction   . Adult ADHD 2017    [place], predominantly inattentive presentation  . Depressive personality disorder 2017    [place]    Past Surgical History  Procedure Laterality Date  . Hernia repair      umbilical, at age 80 y/o    Social History   Social History  . Marital Status: Married    Spouse Name: N/A  . Number of Children: 2  . Years of Education: N/A   Occupational History  . IT    Social History Main Topics  . Smoking status: Never Smoker   . Smokeless tobacco: Never Used  . Alcohol Use: Yes     Comment: socially   . Drug Use: No  . Sexual Activity: Not on file   Other Topics Concern  . Not on file   Social History Narrative   Household-- pt , wife , 2 children 10-7 y/0        Medication List       This list is accurate as of: 04/24/16  3:48 PM.  Always use your most recent med list.               ALPRAZolam 0.5 MG tablet  Commonly known as:  XANAX  Take 1-2 tablets (0.5-1 mg total) by mouth daily as needed (for flights).     amphetamine-dextroamphetamine 10 MG tablet  Commonly known as:  ADDERALL  Take 1 tablet (10 mg total) by mouth 2 (two) times daily with a meal.     vardenafil 20 MG tablet  Commonly known as:  LEVITRA  Take 0.5-1 tablets (10-20 mg total) by mouth daily as needed for erectile  dysfunction.           Objective:   Physical Exam BP 108/68 mmHg  Pulse 92  Temp(Src) 97.9 F (36.6 C) (Oral)  Ht '5\' 5"'$  (1.651 m)  Wt 229 lb (103.874 kg)  BMI 38.11 kg/m2  SpO2 99% General:   Well developed, well nourished . NAD.  HEENT:  Normocephalic . Face symmetric, atraumatic Skin: Not pale. Not jaundice Neurologic:  alert & oriented X3.  Speech normal, gait appropriate for age and unassisted Psych--  Cognition and judgment appear intact.  Cooperative with normal attention span and concentration.  Behavior appropriate. No anxious or depressed appearing.      Assessment & Plan:   Assessment ADHD (confirmed by psychology 03-2016) H/o ED  Plan: ADHD: DX confirmed by psychology, options discussed, we elected adderall , will start with a low dose of 10 mg: One tablet daily and titrate up as needed up to 3 tablets a day. We'll reassess on follow-up. s/e discussed ED: Currently on Viagra, prefers Levitra, prescription sent Also, going to [place], needs something to help him sleep in the airplane. Options discussed, I prefer Xanax due to short  half-lfe. Recommend a low dose, see instructions, recommend also to do trial dose before he actually flies to be sure no s/e RTC in few weeks for CPX

## 2016-04-24 NOTE — Assessment & Plan Note (Signed)
ADHD: DX confirmed by psychology, options discussed, we elected adderall , will start with a low dose of 10 mg: One tablet daily and titrate up as needed up to 3 tablets a day. We'll reassess on follow-up. s/e discussed ED: Currently on Viagra, prefers Levitra, prescription sent Also, going to [place], needs something to help him sleep in the airplane. Options discussed, I prefer Xanax due to short half-lfe. Recommend a low dose, see instructions, recommend also to do trial dose before he actually flies to be sure no s/e RTC in few weeks for CPX

## 2016-05-12 ENCOUNTER — Encounter: Payer: Self-pay | Admitting: Internal Medicine

## 2016-05-12 MED ORDER — TADALAFIL 20 MG PO TABS: 10.0000 mg | ORAL_TABLET | Freq: Every day | ORAL | Status: DC | PRN

## 2016-05-12 NOTE — Telephone Encounter (Signed)
PCP NOTES >>>>>>> - Colon Branch, MD at 04/24/2016 3:50 PM     Status: Written Related Problem: PCP NOTES >>>>>>>   Expand All Collapse All   ADHD: DX confirmed by psychology, options discussed, we elected adderall , will start with a low dose of 10 mg: One tablet daily and titrate up as needed up to 3 tablets a day. We'll reassess on follow-up. s/e discussed ED: Currently on Viagra, prefers Levitra, prescription sent Also, going to [place], needs something to help him sleep in the airplane. Options discussed, I prefer Xanax due to short half-lfe. Recommend a low dose, see instructions, recommend also to do trial dose before he actually flies to be sure no s/e RTC in few weeks for CPX          Pt started on Adderall 10 mg as directed above at [place] on 04/24/2016. Pt requesting Adderall dosage be increased (See MyChart message). Informed Pt via MyChart that PCP is out of the office until next week but that I would send to covering provider.

## 2016-05-12 NOTE — Telephone Encounter (Signed)
He will have to wait for Dr. Larose Kells to return regarding changing his Adderall dosage. In regards to the Cialis -- I am ok making the change. Ok to switch to Cialis 20 mg. Take 10-20 mg PO for erectile dysfunction. No more than 1 dose in 36 hours. Quantity 12 with 1 refill.

## 2016-05-12 NOTE — Telephone Encounter (Signed)
Please advise in PCP absence.  

## 2016-05-12 NOTE — Telephone Encounter (Signed)
Per Einar Pheasant: In regards to the Cialis -- I am ok making the change. Ok to switch to Cialis 20 mg. Take 10-20 mg PO for erectile dysfunction. No more than 1 dose in 36 hours. Quantity 12 with 1 refill. Levitra d/c, Cialis sent to [place]. Pt informed on medication instructions via MyChart. Will inform him to call if questions.

## 2016-05-12 NOTE — Telephone Encounter (Signed)
Will inform Pt that I will hold onto MyChart message regarding Adderall dosing until Dr. Larose Kells returns. In regards to Cialis, see other MyChart message dated 05/12/2016.

## 2016-05-12 NOTE — Telephone Encounter (Signed)
See note from other Mychart message sent today.

## 2016-05-16 ENCOUNTER — Encounter: Payer: Self-pay | Admitting: Internal Medicine

## 2016-05-16 ENCOUNTER — Other Ambulatory Visit: Payer: Self-pay | Admitting: Internal Medicine

## 2016-05-16 DIAGNOSIS — Z79899 Other long term (current) drug therapy: Secondary | ICD-10-CM | POA: Diagnosis not present

## 2016-05-16 MED ORDER — AMPHETAMINE-DEXTROAMPHETAMINE 15 MG PO TABS: ORAL_TABLET | ORAL | Status: DC

## 2016-05-16 NOTE — Telephone Encounter (Signed)
Duplicate MyChart message received. Original message sent to Dr. Larose Kells for review and advice.

## 2016-05-16 NOTE — Telephone Encounter (Signed)
print RX, see new dose   Received: Today    Colon Branch, MD  Damita Dunnings, CMA            Adderall 15 mg 2 in the morning, one in the afternoon  #90, no refills     Adderall 10 mg d/c, Adderall 15 mg 2 tabs in AM and 1 in PM, #90 and 0RF printed, awaiting MD signature. Pt will need controlled substance contract and UDS at time of Rx pick up.

## 2016-05-16 NOTE — Telephone Encounter (Signed)
Rx placed at front desk for pick up at Pt's convenience. Pt informed via MyChart.

## 2016-05-17 ENCOUNTER — Encounter: Payer: Self-pay | Admitting: Internal Medicine

## 2016-05-18 ENCOUNTER — Telehealth: Payer: Self-pay

## 2016-05-18 ENCOUNTER — Encounter: Payer: Self-pay | Admitting: Internal Medicine

## 2016-05-18 NOTE — Telephone Encounter (Signed)
PA initiated via Covermymeds. Awaiting determination.

## 2016-05-19 NOTE — Telephone Encounter (Signed)
Caller name:Nicole Kindred Relation to pt: [employer] PA  Call back number:[phone]   Reason for call:  [employer] faxed over a few questions regarding PA to [phone] Attention Lamount Cohen.

## 2016-05-22 ENCOUNTER — Encounter: Payer: Self-pay | Admitting: Internal Medicine

## 2016-05-22 MED ORDER — AMPHETAMINE-DEXTROAMPHETAMINE 15 MG PO TABS: ORAL_TABLET | ORAL | Status: DC

## 2016-05-22 NOTE — Telephone Encounter (Signed)
Rx placed at front desk for pick up at Pt's convenience.  

## 2016-05-22 NOTE — Telephone Encounter (Signed)
PA denied. Medication is approved when brand Adderall XR has been tried and failed  Try send script as brand name only.

## 2016-05-22 NOTE — Telephone Encounter (Signed)
Returned call to [place] at number listed below.  The determination that we received from [facility] [place] via fax is for adderall ER (generic) and pt takes plain adderall (generic). Needing clarification

## 2016-05-22 NOTE — Telephone Encounter (Signed)
ok 1 week supply, see below   Received: Today    Colon Branch, MD  Damita Dunnings, CMA   Rx printed, awaiting MD signature.

## 2016-05-22 NOTE — Telephone Encounter (Signed)
Fax not received

## 2016-05-23 ENCOUNTER — Encounter: Payer: Self-pay | Admitting: Internal Medicine

## 2016-05-23 ENCOUNTER — Ambulatory Visit (INDEPENDENT_AMBULATORY_CARE_PROVIDER_SITE_OTHER): Payer: BLUE CROSS/BLUE SHIELD | Admitting: Internal Medicine

## 2016-05-23 VITALS — BP 124/76 | HR 109 | Temp 98.2°F | Ht 65.0 in | Wt 218.1 lb

## 2016-05-23 DIAGNOSIS — H9203 Otalgia, bilateral: Secondary | ICD-10-CM

## 2016-05-23 DIAGNOSIS — Z0000 Encounter for general adult medical examination without abnormal findings: Secondary | ICD-10-CM

## 2016-05-23 DIAGNOSIS — H538 Other visual disturbances: Secondary | ICD-10-CM

## 2016-05-23 DIAGNOSIS — F988 Other specified behavioral and emotional disorders with onset usually occurring in childhood and adolescence: Secondary | ICD-10-CM

## 2016-05-23 DIAGNOSIS — N529 Male erectile dysfunction, unspecified: Secondary | ICD-10-CM

## 2016-05-23 DIAGNOSIS — F909 Attention-deficit hyperactivity disorder, unspecified type: Secondary | ICD-10-CM

## 2016-05-23 LAB — CBC WITH DIFFERENTIAL/PLATELET
Basophils Absolute: 0 10*3/uL (ref 0.0–0.1)
Basophils Relative: 0.6 % (ref 0.0–3.0)
EOS PCT: 2.4 % (ref 0.0–5.0)
Eosinophils Absolute: 0.2 10*3/uL (ref 0.0–0.7)
HEMATOCRIT: 44.7 % (ref 39.0–52.0)
Hemoglobin: 15.2 g/dL (ref 13.0–17.0)
LYMPHS ABS: 2 10*3/uL (ref 0.7–4.0)
LYMPHS PCT: 27 % (ref 12.0–46.0)
MCHC: 34.1 g/dL (ref 30.0–36.0)
MCV: 85.6 fl (ref 78.0–100.0)
MONOS PCT: 5.8 % (ref 3.0–12.0)
Monocytes Absolute: 0.4 10*3/uL (ref 0.1–1.0)
NEUTROS ABS: 4.8 10*3/uL (ref 1.4–7.7)
NEUTROS PCT: 64.2 % (ref 43.0–77.0)
PLATELETS: 318 10*3/uL (ref 150.0–400.0)
RBC: 5.23 Mil/uL (ref 4.22–5.81)
RDW: 12.9 % (ref 11.5–15.5)
WBC: 7.5 10*3/uL (ref 4.0–10.5)

## 2016-05-23 LAB — COMPREHENSIVE METABOLIC PANEL
ALBUMIN: 4.6 g/dL (ref 3.5–5.2)
ALK PHOS: 75 U/L (ref 39–117)
ALT: 25 U/L (ref 0–53)
AST: 21 U/L (ref 0–37)
BILIRUBIN TOTAL: 0.5 mg/dL (ref 0.2–1.2)
BUN: 9 mg/dL (ref 6–23)
CALCIUM: 9.6 mg/dL (ref 8.4–10.5)
CO2: 30 mEq/L (ref 19–32)
CREATININE: 0.95 mg/dL (ref 0.40–1.50)
Chloride: 105 mEq/L (ref 96–112)
GFR: 93.32 mL/min (ref >60.00)
Glucose, Bld: 104 mg/dL — ABNORMAL HIGH (ref 70–99)
Potassium: 3.7 mEq/L (ref 3.5–5.1)
Sodium: 143 mEq/L (ref 135–145)
TOTAL PROTEIN: 7.7 g/dL (ref 6.0–8.3)

## 2016-05-23 LAB — LIPID PANEL
CHOLESTEROL: 220 mg/dL — AB (ref 0–200)
HDL: 36 mg/dL — ABNORMAL LOW (ref >39.00)
NonHDL: 184.04
TRIGLYCERIDES: 286 mg/dL — AB (ref 0.0–149.0)
Total CHOL/HDL Ratio: 6
VLDL: 57.2 mg/dL — ABNORMAL HIGH (ref 0.0–40.0)

## 2016-05-23 LAB — LDL CHOLESTEROL, DIRECT: Direct LDL: 61 mg/dL

## 2016-05-23 LAB — TSH: TSH: 1.57 u[IU]/mL (ref 0.35–4.50)

## 2016-05-23 MED ORDER — AMPHETAMINE-DEXTROAMPHETAMINE 10 MG PO TABS: ORAL_TABLET | ORAL | Status: DC

## 2016-05-23 MED ORDER — VARDENAFIL HCL 20 MG PO TABS: 20.0000 mg | ORAL_TABLET | Freq: Every day | ORAL | Status: DC | PRN

## 2016-05-23 NOTE — Assessment & Plan Note (Signed)
ADHD: We had a long conversation about dosing, he tried Adderall 15 mg 2 tablets in the morning today and feel that is too much. We eventually agree on try Adderall 10 mg 2 tablets in the morning and one the afternoon as needed. New prescription issue. Ear discomfort: Probably serous otitis, declined to take Flonase, recommend to call if not completely well in few weeks for a ENT referral Reports blurred vision at the end of the day, usually at night when he tries to read. Wonders if it is side effects from Adderall, I doubt it. Recommend to see the eye doctor first before we do anything else. Referral to be done. ED: Cialis takes too long to work, likes to change to [employer]. Wonders about alternatives, we could refer him to urology for a trial with muse. RTC 3 months

## 2016-05-23 NOTE — Telephone Encounter (Signed)
Dosage changed back to Adderall 10 mg 2 tabs in AM and 1 in PM. PA no longer needed.

## 2016-05-23 NOTE — Assessment & Plan Note (Addendum)
Td 2015  Had a Colonoscopy in 2008 for rectal bleeding, now w/ no sx  Diet and exercise discussed Likes to check a testosterone and PSA, advise against such testing, after long conversation I agree to check a testosterone level. Labs: See orders.

## 2016-05-23 NOTE — Progress Notes (Signed)
Pre visit review using our clinic review tool, if applicable. No additional management support is needed unless otherwise documented below in the visit note. 

## 2016-05-23 NOTE — Patient Instructions (Signed)
GO TO THE LAB : Get the blood work     GO TO THE FRONT DESK Schedule your next appointment for a  CHECK UP IN 3 MONTHS

## 2016-05-23 NOTE — Progress Notes (Signed)
Subjective:    Patient ID: Carl Beltran, male    DOB: [birth year], 40 y.o.   MRN: [id number]  DOS:  05/23/2016 Type of visit - description : Complete physical exam Interval history:  In addition to CPX we discussed the following: ADHD: Today tried adderall  30 mg in the morning  for first time, does not like it. Difficulty sleeping in the airplane: was rx Xanax, did not like it. Reports ear discomfort B for several weeks, already getting better but likes that checked. Otherwise HEENT ROS negative Erectile dysfunction: Likes to change Cialis   Review of Systems  Constitutional: No fever. No chills. No unexplained wt changes. No unusual sweats  HEENT: No dental problems, no ear discharge, no facial swelling, no voice changes. No eye discharge, no eye  redness , no  intolerance to light   Respiratory: No wheezing , no  difficulty breathing. No cough , no mucus production  Cardiovascular: No CP, no leg swelling , no  Palpitations  GI: no nausea, no vomiting, no diarrhea , no  abdominal pain.  No blood in the stools. No dysphagia, no odynophagia    Endocrine: No polyphagia, no polyuria , no polydipsia  GU: No dysuria, gross hematuria, difficulty urinating. No urinary urgency, no frequency.  Musculoskeletal: No joint swellings or unusual aches or pains  Skin: No change in the color of the skin, palor , no  Rash  Allergic, immunologic: No environmental allergies , no  food allergies  Neurological: No dizziness no  syncope. No headaches. No diplopia, no slurred, no slurred speech, no motor deficits, no facial  Numbness  Hematological: No enlarged lymph nodes, no easy bruising , no unusual bleedings  Psychiatry: No suicidal ideas, no hallucinations, no beavior problems, no confusion.  Some depression but reports Carl Beltran is better with ADD treatment   Past Medical History  Diagnosis Date  . Erectile dysfunction   . Adult ADHD 2017    [place], predominantly  inattentive presentation  . Depressive personality disorder 2017    [place]  . Attention deficit hyperactivity disorder (ADHD) 04/24/2016    Past Surgical History  Procedure Laterality Date  . Hernia repair      umbilical, at age 12 y/o    Social History   Social History  . Marital Status: Married    Spouse Name: N/A  . Number of Children: 2  . Years of Education: N/A   Occupational History  . IT    Social History Main Topics  . Smoking status: Never Smoker   . Smokeless tobacco: Never Used  . Alcohol Use: Yes     Comment: socially   . Drug Use: No  . Sexual Activity: Not on file   Other Topics Concern  . Not on file   Social History Narrative   Household-- pt , wife , 2 boys      Family History  Problem Relation Age of Onset  . Colon cancer Neg Hx   . Prostate cancer Neg Hx   . Diabetes Neg Hx   . CAD Other     GF       Medication List       This list is accurate as of: 05/23/16  6:56 PM.  Always use your most recent med list.               amphetamine-dextroamphetamine 10 MG tablet  Commonly known as:  ADDERALL  2 tabs in AM, 1 in the afternoon  prn     vardenafil 20 MG tablet  Commonly known as:  LEVITRA  Take 1 tablet (20 mg total) by mouth daily as needed for erectile dysfunction.           Objective:   Physical Exam BP 124/76 mmHg  Pulse 109  Temp(Src) 98.2 F (36.8 C) (Oral)  Ht 5\' 5"  (1.651 m)  Wt 218 lb 2 oz (98.941 kg)  BMI 36.30 kg/m2  SpO2 99%  General:   Well developed, well nourished . NAD.  Neck: No  thyromegaly  HEENT:  Normocephalic . Face symmetric, atraumatic. TMs: Slightly bulge, not read. Throat symmetric, not read. Lungs:  CTA B Normal respiratory effort, no intercostal retractions, no accessory muscle use. Heart: RRR,  no murmur.  No pretibial edema bilaterally  Abdomen:  Not distended, soft, non-tender. No rebound or rigidity.   Skin: Exposed areas without rash. Not pale. Not  jaundice Neurologic:  alert & oriented X3.  Speech normal, gait appropriate for age and unassisted Strength symmetric and appropriate for age.  Psych: Cognition and judgment appear intact.  Cooperative with normal attention span and concentration.  Behavior appropriate. No anxious or depressed appearing.    Assessment & Plan:   Assessment ADHD (confirmed by psychology 03-2016) Depression (dx by psych 03-2016) H/o ED  PLAN: ADHD: We had a long conversation about dosing, Carl Beltran tried Adderall 15 mg 2 tablets in the morning today and feel that is too much. We eventually agree on try Adderall 10 mg 2 tablets in the morning and one the afternoon as needed. New prescription issue. Ear discomfort: Probably serous otitis, declined to take Flonase, recommend to call if not completely well in few weeks for a ENT referral Reports blurred vision at the end of the day, usually at night when Carl Beltran tries to read. Wonders if it is side effects from Adderall, I doubt it. Recommend to see the eye doctor first before we do anything else. Referral to be done. ED: Cialis takes too long to work, likes to change to [employer]. Wonders about alternatives, we could refer him to urology for a trial with muse. RTC 3 months   Today, I spent more than 15   min with the patient (in addition to CPX)   : >50% of the time counseling regards ADHD, ear pain, ED . Multiple questions answered to the best of my ability,  listening to all of his concerns.

## 2016-05-24 ENCOUNTER — Other Ambulatory Visit: Payer: Self-pay | Admitting: *Deleted

## 2016-05-24 ENCOUNTER — Telehealth: Payer: Self-pay | Admitting: *Deleted

## 2016-05-24 MED ORDER — VARDENAFIL HCL 20 MG PO TABS: 20.0000 mg | ORAL_TABLET | Freq: Every day | ORAL | Status: DC | PRN

## 2016-05-24 NOTE — Telephone Encounter (Signed)
PA approved for 4 per 30 days. New script e-scribed to pharmacy.

## 2016-05-24 NOTE — Addendum Note (Signed)
Addended by: Murtis Sink A on: 05/24/2016 02:00 PM   Modules accepted: Orders

## 2016-05-24 NOTE — Telephone Encounter (Signed)
PA approved for Adderall 15mg  TID Approval letter send scanning

## 2016-05-24 NOTE — Telephone Encounter (Signed)
PA initiated on [email or web address] Key: [id number]  Awaiting determination

## 2016-05-28 LAB — TESTOS,TOTAL,FREE AND SHBG (FEMALE)
SEX HORMONE BINDING GLOB.: 26 nmol/L (ref 10–50)
Testosterone, Free: 77.6 pg/mL (ref 35.0–155.0)
Testosterone,Total,LC/MS/MS: 459 ng/dL (ref 250–1100)

## 2016-05-29 ENCOUNTER — Ambulatory Visit: Payer: BLUE CROSS/BLUE SHIELD | Admitting: Psychology

## 2016-06-05 ENCOUNTER — Telehealth: Payer: Self-pay

## 2016-06-05 NOTE — Telephone Encounter (Signed)
UDS: 05/19/2016  Negative for Alprazolam: PRN Positive for Amphetamines: Pt on Adderall   Low risk per Dr. Larose Kells 06/05/2016

## 2016-06-13 ENCOUNTER — Ambulatory Visit: Payer: BLUE CROSS/BLUE SHIELD | Admitting: Psychology

## 2016-06-22 ENCOUNTER — Telehealth: Payer: Self-pay | Admitting: Internal Medicine

## 2016-06-23 MED ORDER — AMPHETAMINE-DEXTROAMPHETAMINE 10 MG PO TABS: ORAL_TABLET | ORAL | 0 refills | Status: DC

## 2016-06-23 NOTE — Telephone Encounter (Signed)
Okay #90, 2 prescriptions 

## 2016-06-23 NOTE — Telephone Encounter (Signed)
Rx's for August and September 2017 printed, awaiting MD signature.  

## 2016-06-23 NOTE — Telephone Encounter (Signed)
Pt is requesting refill on Adderall.  Last OV: 05/23/2016 Last Fill: 05/23/2016 #90 and 0RF (Pt sig: 2 tabs in AM and 1 in PM) UDS: 05/19/2016 Low risk  Please advise.

## 2016-06-23 NOTE — Telephone Encounter (Signed)
Pt informed via MyChart that Rx's have been placed at front desk for pick up.  

## 2016-09-19 ENCOUNTER — Encounter: Payer: Self-pay | Admitting: Internal Medicine

## 2016-09-19 ENCOUNTER — Other Ambulatory Visit: Payer: Self-pay | Admitting: Internal Medicine

## 2016-09-19 MED ORDER — AMPHETAMINE-DEXTROAMPHETAMINE 10 MG PO TABS: ORAL_TABLET | ORAL | 0 refills | Status: DC

## 2016-09-19 NOTE — Telephone Encounter (Signed)
Per PCP, okay to print Rx for #180 and 0RF (which is a 2 month supply). Pt informed via MyChart that Rx has been printed and ready for pick up. He is also informed that his insurance may not cover a 2 month supply.

## 2016-09-19 NOTE — Telephone Encounter (Signed)
Pt is requesting refill on Adderall. Pt is requesting 2 prescriptions (Pt travels frequently for work)  Last OV: 05/23/2016 Last Fill: 06/23/2016 #90 and 0RF For August and September 2017 UDS: 05/19/2016 Low risk  Please advise.

## 2016-12-28 ENCOUNTER — Other Ambulatory Visit: Payer: Self-pay | Admitting: Internal Medicine

## 2017-01-15 ENCOUNTER — Other Ambulatory Visit: Payer: Self-pay | Admitting: Internal Medicine

## 2017-01-16 MED ORDER — AMPHETAMINE-DEXTROAMPHETAMINE 10 MG PO TABS
ORAL_TABLET | ORAL | 0 refills | Status: DC
Start: 2017-01-16 — End: 2017-04-25

## 2017-01-16 NOTE — Telephone Encounter (Signed)
Pt informed via MyChart that Rx has been placed at front desk for pick up.  

## 2017-01-16 NOTE — Telephone Encounter (Signed)
Pt is requesting refill on Adderall. Pt is going to [place] for 2 weeks in April and is requesting 2 month prescription.  Last OV: 05/23/2016 Last Fill: 09/19/2016 #180 and 0RF UDS: 05/19/2016 Low risk  Please advise.

## 2017-01-16 NOTE — Telephone Encounter (Signed)
Rx printed, awaiting MD signature.  

## 2017-01-16 NOTE — Telephone Encounter (Signed)
Okay #180, no refills 

## 2017-02-01 ENCOUNTER — Encounter: Payer: Self-pay | Admitting: Internal Medicine

## 2017-02-01 MED ORDER — SILDENAFIL CITRATE 100 MG PO TABS: 50.0000 mg | ORAL_TABLET | Freq: Every day | ORAL | 5 refills | Status: DC | PRN

## 2017-02-01 NOTE — Telephone Encounter (Signed)
see message  Received: Today  Message Contents  Colon Branch, MD  Damita Dunnings, CMA        DC Levitra  Send Viagra 100 mg half or 1 tablet by mouth daily prn #10, 5 refills

## 2017-02-01 NOTE — Telephone Encounter (Signed)
Levitra d/c. Viagra Rx sent.

## 2017-02-05 ENCOUNTER — Telehealth: Payer: Self-pay

## 2017-02-05 NOTE — Telephone Encounter (Signed)
PA initiated via Covermymeds; KEY: [id number]. Awaiting determination.

## 2017-02-08 NOTE — Telephone Encounter (Signed)
Received electronic PA denial, awaiting PA notification letter.

## 2017-02-09 MED ORDER — VIAGRA 100 MG PO TABS: 50.0000 mg | ORAL_TABLET | Freq: Every day | ORAL | 5 refills | Status: DC | PRN

## 2017-02-09 NOTE — Telephone Encounter (Signed)
Pt's insurance covers brand name Viagra only. Rx for brand name Viagra sent to [facility]. PA notification letter sent for scanning.

## 2017-02-09 NOTE — Addendum Note (Signed)
Addended byDamita Dunnings D on: 02/09/2017 08:09 AM   Modules accepted: Orders

## 2017-04-25 ENCOUNTER — Other Ambulatory Visit: Payer: Self-pay | Admitting: Internal Medicine

## 2017-04-26 MED ORDER — AMPHETAMINE-DEXTROAMPHETAMINE 10 MG PO TABS: ORAL_TABLET | ORAL | 0 refills | Status: DC

## 2017-04-26 NOTE — Telephone Encounter (Signed)
Spoke w/ Pt, informed PCP out of town until 04/30/2017, informed that Dr. Charlett Blake willing to give 30 day in PCP absence. Pt verbalized understanding. Rx printed, signed and placed at front desk for pick up.

## 2017-04-26 NOTE — Telephone Encounter (Signed)
Pt is requesting refill on Adderall 10mg  tabs. Paz Pt.  Last OV: 05/23/2016, no future appt scheduled, will inform Pt to schedule before next refills Last Fill: 01/16/2017 #180 and 0RF UDS: 05/16/2016 Low risk   Please advise.

## 2017-04-26 NOTE — Telephone Encounter (Signed)
I am willing to cover a 30 day supply of his Adderall because after that it will be a year since he has been seen

## 2017-05-17 ENCOUNTER — Ambulatory Visit (INDEPENDENT_AMBULATORY_CARE_PROVIDER_SITE_OTHER): Payer: BLUE CROSS/BLUE SHIELD | Admitting: Internal Medicine

## 2017-05-17 ENCOUNTER — Encounter: Payer: Self-pay | Admitting: Internal Medicine

## 2017-05-17 VITALS — BP 112/62 | HR 89 | Temp 98.1°F | Resp 12 | Ht 65.0 in | Wt 209.4 lb

## 2017-05-17 DIAGNOSIS — Z114 Encounter for screening for human immunodeficiency virus [HIV]: Secondary | ICD-10-CM | POA: Diagnosis not present

## 2017-05-17 DIAGNOSIS — Z0000 Encounter for general adult medical examination without abnormal findings: Secondary | ICD-10-CM | POA: Diagnosis not present

## 2017-05-17 DIAGNOSIS — Z79899 Other long term (current) drug therapy: Secondary | ICD-10-CM | POA: Diagnosis not present

## 2017-05-17 DIAGNOSIS — F900 Attention-deficit hyperactivity disorder, predominantly inattentive type: Secondary | ICD-10-CM | POA: Diagnosis not present

## 2017-05-17 LAB — CBC WITH DIFFERENTIAL/PLATELET
BASOS ABS: 0.1 10*3/uL (ref 0.0–0.1)
Basophils Relative: 0.9 % (ref 0.0–3.0)
EOS ABS: 0.2 10*3/uL (ref 0.0–0.7)
Eosinophils Relative: 2.4 % (ref 0.0–5.0)
HEMATOCRIT: 44.8 % (ref 39.0–52.0)
Hemoglobin: 15.4 g/dL (ref 13.0–17.0)
LYMPHS ABS: 2 10*3/uL (ref 0.7–4.0)
LYMPHS PCT: 29.1 % (ref 12.0–46.0)
MCHC: 34.3 g/dL (ref 30.0–36.0)
MCV: 86.9 fl (ref 78.0–100.0)
MONOS PCT: 7.9 % (ref 3.0–12.0)
Monocytes Absolute: 0.5 10*3/uL (ref 0.1–1.0)
NEUTROS ABS: 4.1 10*3/uL (ref 1.4–7.7)
NEUTROS PCT: 59.7 % (ref 43.0–77.0)
PLATELETS: 315 10*3/uL (ref 150.0–400.0)
RBC: 5.15 Mil/uL (ref 4.22–5.81)
RDW: 13.1 % (ref 11.5–15.5)
WBC: 6.9 10*3/uL (ref 4.0–10.5)

## 2017-05-17 LAB — LIPID PANEL
CHOL/HDL RATIO: 4
CHOLESTEROL: 182 mg/dL (ref 0–200)
HDL: 42.7 mg/dL (ref >39.00)
LDL CALC: 105 mg/dL — AB (ref 0–99)
NonHDL: 139.11
TRIGLYCERIDES: 173 mg/dL — AB (ref 0.0–149.0)
VLDL: 34.6 mg/dL (ref 0.0–40.0)

## 2017-05-17 LAB — BASIC METABOLIC PANEL
BUN: 11 mg/dL (ref 6–23)
CHLORIDE: 105 meq/L (ref 96–112)
CO2: 29 meq/L (ref 19–32)
CREATININE: 1.06 mg/dL (ref 0.40–1.50)
Calcium: 9.6 mg/dL (ref 8.4–10.5)
GFR: 81.84 mL/min (ref >60.00)
Glucose, Bld: 112 mg/dL — ABNORMAL HIGH (ref 70–99)
Potassium: 4.3 mEq/L (ref 3.5–5.1)
SODIUM: 142 meq/L (ref 135–145)

## 2017-05-17 LAB — HEMOGLOBIN A1C: Hgb A1c MFr Bld: 5.2 % (ref 4.6–6.5)

## 2017-05-17 MED ORDER — SILDENAFIL CITRATE 20 MG PO TABS: 60.0000 mg | ORAL_TABLET | Freq: Every evening | ORAL | 3 refills | Status: DC | PRN

## 2017-05-17 MED ORDER — AMPHETAMINE-DEXTROAMPHETAMINE 10 MG PO TABS: ORAL_TABLET | ORAL | 0 refills | Status: DC

## 2017-05-17 NOTE — Patient Instructions (Signed)
GO TO THE LAB : Get the blood work     GO TO THE FRONT DESK Schedule your next appointment for a  physical exam in one year  

## 2017-05-17 NOTE — Progress Notes (Signed)
Subjective:    Patient ID: Carl Beltran, male    DOB: [birth year], 41 y.o.   MRN: [id number]  DOS:  05/17/2017 Type of visit - description : CPX Interval history: Good compliance with ADHD medication without apparent side effects.   Review of Systems His father was dx with colon cancer last week, the patient's wife wonders if the patient needs medication for depression. He is obviously affected by the news, somewhat depressed about it, not suicidal. Some insomnia. Has chronic wrist pain, has seen orthopedic before, symptoms stable over time.   Other than above, a 14 point review of systems is negative    Past Medical History:  Diagnosis Date  . Adult ADHD 2017   [place], predominantly inattentive presentation  . Attention deficit hyperactivity disorder (ADHD) 04/24/2016  . Depressive personality disorder 2017   [place]  . Erectile dysfunction     Past Surgical History:  Procedure Laterality Date  . HERNIA REPAIR     umbilical, at age 20 y/o    Social History   Social History  . Marital status: Married    Spouse name: N/A  . Number of children: 2  . 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 Smoking status: Never Smoker  . Smokeless tobacco: Never Used  . Alcohol use Yes     Comment: socially   . Drug use: No  . Sexual activity: Not on file   Other Topics Concern  . Not on file   Social History Narrative   Household-- pt , wife ,2009, 2006     Family History  Problem Relation Age of Onset  . CAD Other        GF  . Tongue cancer Father   . Colon cancer Neg Hx   . Prostate cancer Neg Hx   . Diabetes Neg Hx      Allergies as of 05/17/2017   No Known Allergies     Medication List       Accurate as of 05/17/17 11:59 PM. Always use your most recent med list.          amphetamine-dextroamphetamine 10 MG tablet Commonly known as:  ADDERALL Take 2 tablets  by mouth in AM, and 1 tablet by mouth in PM as needed.   sildenafil 20 MG tablet Commonly known as:  REVATIO Take 3-4 tablets (60-80 mg total) by mouth at bedtime as needed.   VIAGRA 100 MG tablet Generic drug:  sildenafil Take 0.5-1 tablets (50-100 mg total) by mouth daily as needed for erectile dysfunction.          Objective:   Physical Exam BP 112/62 (BP Location: Left Arm, Patient Position: Sitting, Cuff Size: Normal)   Pulse 89   Temp 98.1 F (36.7 C) (Oral)   Resp 12   Ht 5\' 5"  (1.651 m)   Wt 209 lb 6 oz (95 kg)   SpO2 98%   BMI 34.84 kg/m   General:   Well developed, well nourished . NAD.  Neck: No  thyromegaly . No mass or pathological lymph nodes. Normal submandibular salivary glands. HEENT:  Normocephalic . Face symmetric, atraumatic Lungs:  CTA B Normal respiratory effort, no intercostal retractions, no accessory muscle use. Heart: RRR,  no murmur.  No pretibial edema bilaterally  Abdomen:  Not distended, soft, non-tender. No rebound or rigidity.   Skin: Exposed areas without rash. Not pale. Not  jaundice Neurologic:  alert & oriented X3.  Speech normal, gait appropriate for age and unassisted Strength symmetric and appropriate for age.  Psych: Cognition and judgment appear intact.  Cooperative with normal attention span and concentration.  Behavior appropriate. No anxious or depressed appearing.    Assessment & Plan:   Assessment ADHD (confirmed by psychology 03-2016) Depression (dx by psych 03-2016) ED  PLAN: ADHD: Well control. UDS and contract today.RF PRN Depression: Mild, aggravated by his father's health. No suicidal. PHQ-9: 4; minimal depression, counseled, call if sx increase, ? Medication. ED: Patient request a generic viagra, Rx for  sildenafil 20 mg sent RTC one year. Sooner if needed

## 2017-05-17 NOTE — Progress Notes (Signed)
Pre visit review using our clinic review tool, if applicable. No additional management support is needed unless otherwise documented below in the visit note. 

## 2017-05-17 NOTE — Assessment & Plan Note (Signed)
-  Td 2015 -Had a Colonoscopy in 2008 for rectal bleeding, now w/ no sx  -Diet and exercise discussed -LABS : BMP, FLP, CBC, A1c,HIV

## 2017-05-18 LAB — HIV ANTIBODY (ROUTINE TESTING W REFLEX): HIV: NONREACTIVE

## 2017-05-18 NOTE — Assessment & Plan Note (Signed)
ADHD: Well control. UDS and contract today.RF PRN Depression: Mild, aggravated by his father's health. No suicidal. PHQ-9: 4; minimal depression, counseled, call if sx increase, ? Medication. ED: Patient request a generic viagra, Rx for  sildenafil 20 mg sent RTC one year. Sooner if needed

## 2017-05-21 ENCOUNTER — Encounter: Payer: Self-pay | Admitting: Internal Medicine

## 2017-05-28 ENCOUNTER — Encounter: Payer: Self-pay | Admitting: Internal Medicine

## 2017-05-29 ENCOUNTER — Telehealth: Payer: Self-pay

## 2017-05-29 NOTE — Telephone Encounter (Signed)
UDS: 05/17/2017  Adderall- detected  Low risk per PCP 05/29/2017

## 2017-06-04 MED ORDER — SILDENAFIL CITRATE 20 MG PO TABS: 60.0000 mg | ORAL_TABLET | Freq: Every evening | ORAL | 3 refills | Status: DC | PRN

## 2017-08-09 DIAGNOSIS — D2271 Melanocytic nevi of right lower limb, including hip: Secondary | ICD-10-CM | POA: Diagnosis not present

## 2017-08-09 DIAGNOSIS — D225 Melanocytic nevi of trunk: Secondary | ICD-10-CM | POA: Diagnosis not present

## 2017-08-09 DIAGNOSIS — D224 Melanocytic nevi of scalp and neck: Secondary | ICD-10-CM | POA: Diagnosis not present

## 2017-08-09 DIAGNOSIS — D2262 Melanocytic nevi of left upper limb, including shoulder: Secondary | ICD-10-CM | POA: Diagnosis not present

## 2017-08-13 ENCOUNTER — Other Ambulatory Visit: Payer: Self-pay | Admitting: Internal Medicine

## 2017-08-13 ENCOUNTER — Encounter: Payer: Self-pay | Admitting: Internal Medicine

## 2017-08-13 MED ORDER — AMPHETAMINE-DEXTROAMPHETAMINE 10 MG PO TABS: ORAL_TABLET | ORAL | 0 refills | Status: DC

## 2017-08-13 NOTE — Telephone Encounter (Signed)
Okay #180, no refills 

## 2017-08-13 NOTE — Telephone Encounter (Signed)
Rx printed, awaiting MD signature.  

## 2017-08-13 NOTE — Telephone Encounter (Signed)
Pt is requesting refill on Adderall 10mg .   Last OV: 05/17/2017 Last Fill: 05/17/2017 #180 and 0RF UDS: 05/17/2017 Low risk  Please advise.

## 2017-09-10 ENCOUNTER — Encounter: Payer: Self-pay | Admitting: Internal Medicine

## 2017-09-11 MED ORDER — ESCITALOPRAM OXALATE 10 MG PO TABS: 10.0000 mg | ORAL_TABLET | Freq: Every day | ORAL | 1 refills | Status: DC

## 2017-09-11 NOTE — Telephone Encounter (Signed)
see message  Received: Today  Message Contents  Colon Branch, MD  Damita Dunnings, CMA        Please send a prescription for Lexapro 10 mg 1 p.o. daily #30 and one refill.  please ask One of the schedulers or call yourself: Needs a follow-up in 3 weeks.

## 2017-09-11 NOTE — Telephone Encounter (Signed)
Rx sent to Carl Beltran. 3 week f/u scheduled for 10/02/2017.

## 2017-09-29 ENCOUNTER — Other Ambulatory Visit: Payer: Self-pay | Admitting: Internal Medicine

## 2017-10-02 ENCOUNTER — Encounter: Payer: Self-pay | Admitting: Internal Medicine

## 2017-10-02 ENCOUNTER — Ambulatory Visit: Payer: BLUE CROSS/BLUE SHIELD | Admitting: Internal Medicine

## 2017-10-02 VITALS — BP 118/78 | HR 90 | Temp 98.0°F | Resp 14 | Ht 65.0 in | Wt 206.5 lb

## 2017-10-02 DIAGNOSIS — F32A Depression, unspecified: Secondary | ICD-10-CM | POA: Insufficient documentation

## 2017-10-02 DIAGNOSIS — F329 Major depressive disorder, single episode, unspecified: Secondary | ICD-10-CM

## 2017-10-02 MED ORDER — SILDENAFIL CITRATE 20 MG PO TABS: ORAL_TABLET | ORAL | 5 refills | Status: DC

## 2017-10-02 MED ORDER — ESCITALOPRAM OXALATE 10 MG PO TABS: 10.0000 mg | ORAL_TABLET | Freq: Every day | ORAL | 5 refills | Status: DC

## 2017-10-02 MED ORDER — AMPHETAMINE-DEXTROAMPHETAMINE 10 MG PO TABS: ORAL_TABLET | ORAL | 0 refills | Status: DC

## 2017-10-02 NOTE — Progress Notes (Signed)
Pre visit review using our clinic review tool, if applicable. No additional management support is needed unless otherwise documented below in the visit note. 

## 2017-10-02 NOTE — Progress Notes (Signed)
Sildenafil (Revatio) Rx faxed to [facility].

## 2017-10-02 NOTE — Patient Instructions (Signed)
   GO TO THE FRONT DESK Schedule your next appointment for a  Check up in 6 months  

## 2017-10-02 NOTE — Assessment & Plan Note (Addendum)
Depression: Related to his father's health, see HPI.  Started Lexapro earlier this month, good results, no apparent side effects.  Recommend to stay on Lexapro for a few months but also okay if he decides that he does not need SSRIs any longer:  if that is the case will decrease dose to 5 mg qd for 2-3 weeks and then stop. ED: Refill Viagra ADD: Refill meds. RTC 6 months

## 2017-10-02 NOTE — Progress Notes (Signed)
Subjective:    Patient ID: Carl Beltran, male    DOB: 12-14-1975, 41 y.o.   MRN: [id number]  DOS:  10/02/2017 Type of visit - description : f/u Interval history: The patient's father was diagnosed with tongue cancer, treated in [place]. Then he came back home to [place] has had an active role on taking care of him. It created a lot of stress and responsibilities, see patient's message 09-10-17, was rx Lexapro few weeks ago. States that he has help, his wife noticed an improvement, he is less short and easier to "be around"   Review of Systems Denies suicidal ideas Sleep well No nausea, vomiting, diarrhea  Past Medical History:  Diagnosis Date  . Adult ADHD 2017   [place], predominantly inattentive presentation  . Attention deficit hyperactivity disorder (ADHD) 04/24/2016  . Depressive personality disorder 2017   [place]  . Erectile dysfunction     Past Surgical History:  Procedure Laterality Date  . HERNIA REPAIR     umbilical, at age 67 y/o    Social History   Socioeconomic History  . Marital status: Married    Spouse name: Not on file  . Number of children: 2  . Years of education: Not on file  . Highest education level: Not on file  Social Needs  . Financial resource strain: Not on file  . Food insecurity - worry: Not on file  . Food insecurity - inability: Not on file  . Transportation needs - medical: Not on file  . Transportation needs - non-medical: Not on file  Occupational History  . Occupation: IT    Employer: [employer]  Tobacco Use  . Smoking status: Never Smoker  . Smokeless tobacco: Never Used  Substance and Sexual Activity  . Alcohol use: Yes    Comment: socially   . Drug use: No  . Sexual activity: Not on file  Other Topics Concern  . Not on file  Social History Narrative   Household-- pt , wife ,2009, 2006      Allergies as of 10/02/2017   No Known Allergies       Medication List        Accurate as of 10/02/17  5:11 PM. Always use your most recent med list.          amphetamine-dextroamphetamine 10 MG tablet Commonly known as:  ADDERALL Take 2 tablets by mouth in AM, and 1 tablet by mouth in PM as needed.   escitalopram 10 MG tablet Commonly known as:  LEXAPRO Take 1 tablet (10 mg total) by mouth daily.   sildenafil 20 MG tablet Commonly known as:  REVATIO TAKE 3 TO 4 TABLETS (60-80 MG TOTAL) BY MOUTH AT BEDTIME AS NEEDED.          Objective:   Physical Exam BP 118/78 (BP Location: Left Arm, Patient Position: Sitting, Cuff Size: Small)   Pulse 90   Temp 98 F (36.7 C) (Oral)   Resp 14   Ht 5\' 5"  (1.651 m)   Wt 206 lb 8 oz (93.7 kg)   SpO2 98%   BMI 34.36 kg/m  General:   Well developed, well nourished . NAD.  HEENT:  Normocephalic . Face symmetric, atraumatic  Neurologic:  alert & oriented X3.  Speech normal, gait appropriate for age and unassisted Psych--  Cognition and judgment appear intact.  Cooperative with normal attention span and concentration.  Behavior appropriate. No anxious or depressed appearing.  Assessment & Plan:   Assessment ADHD (confirmed by psychology 03-2016) Depression (dx by psych 03-2016) ED  PLAN: Depression: Related to his father's health, see HPI.  Started Lexapro earlier this month, good results, no apparent side effects.  Recommend to stay on Lexapro for a few months but also okay if he decides that he does not need SSRIs any longer:  if that is the case will decrease dose to 5 mg qd for 2-3 weeks and then stop. ED: Refill Viagra ADD: Refill meds. RTC 6 months

## 2017-10-25 DIAGNOSIS — R509 Fever, unspecified: Secondary | ICD-10-CM | POA: Diagnosis not present

## 2017-10-25 DIAGNOSIS — R05 Cough: Secondary | ICD-10-CM | POA: Diagnosis not present

## 2017-10-25 DIAGNOSIS — J209 Acute bronchitis, unspecified: Secondary | ICD-10-CM | POA: Diagnosis not present

## 2017-10-25 DIAGNOSIS — J069 Acute upper respiratory infection, unspecified: Secondary | ICD-10-CM | POA: Diagnosis not present

## 2017-10-26 ENCOUNTER — Ambulatory Visit: Payer: Self-pay | Admitting: Internal Medicine

## 2017-10-28 DIAGNOSIS — J209 Acute bronchitis, unspecified: Secondary | ICD-10-CM | POA: Diagnosis not present

## 2017-10-28 DIAGNOSIS — R05 Cough: Secondary | ICD-10-CM | POA: Diagnosis not present

## 2018-01-10 ENCOUNTER — Encounter: Payer: Self-pay | Admitting: Internal Medicine

## 2018-01-10 MED ORDER — ESCITALOPRAM OXALATE 20 MG PO TABS
20.0000 mg | ORAL_TABLET | Freq: Every day | ORAL | 2 refills | Status: DC
Start: 2018-01-10 — End: 2018-02-27

## 2018-01-10 NOTE — Telephone Encounter (Signed)
Rx sent to [facility]

## 2018-01-10 NOTE — Telephone Encounter (Signed)
see message, send a RX lexapro 20 mg 1 po qd #30, 2 RF  Received: Today  Message Contents  Colon Branch, MD  Damita Dunnings, [place]

## 2018-02-12 ENCOUNTER — Encounter: Payer: Self-pay | Admitting: Internal Medicine

## 2018-02-12 ENCOUNTER — Telehealth: Payer: Self-pay | Admitting: Internal Medicine

## 2018-02-12 NOTE — Telephone Encounter (Signed)
Pt is requesting refill on Adderall 10mg  tablet.   Last OV: 10/02/2017 Last Fill: 10/02/2017 #180 and 0RF UDS: 05/17/2017 Low risk  NCCR printed- no discrepancies noted- sent for scanning  Please advise.

## 2018-02-13 MED ORDER — AMPHETAMINE-DEXTROAMPHETAMINE 10 MG PO TABS: ORAL_TABLET | ORAL | 0 refills | Status: DC

## 2018-02-13 NOTE — Telephone Encounter (Signed)
Sent!

## 2018-02-27 ENCOUNTER — Encounter: Payer: Self-pay | Admitting: Internal Medicine

## 2018-02-27 ENCOUNTER — Ambulatory Visit: Payer: BLUE CROSS/BLUE SHIELD | Admitting: Internal Medicine

## 2018-02-27 VITALS — BP 126/68 | HR 94 | Temp 98.0°F | Resp 16 | Ht 65.0 in | Wt 211.5 lb

## 2018-02-27 DIAGNOSIS — F900 Attention-deficit hyperactivity disorder, predominantly inattentive type: Secondary | ICD-10-CM

## 2018-02-27 DIAGNOSIS — F329 Major depressive disorder, single episode, unspecified: Secondary | ICD-10-CM | POA: Diagnosis not present

## 2018-02-27 DIAGNOSIS — F32A Depression, unspecified: Secondary | ICD-10-CM

## 2018-02-27 NOTE — Progress Notes (Signed)
Subjective:    Patient ID: Carl Beltran, male    DOB: 22-Sep-1976, 42 y.o.   MRN: [id number]  DOS:  02/27/2018 Type of visit - description : Follow-up Interval history: Since the last office visit, he felt the need to increase Lexapro, he went up to 20 mg daily and was feeling "okay" although on looking back he felt better with a 10 mg tablet. Several days ago he took a trip with the family, forgot to take his medication, and he felt that he was doing better without them.  Wife told him that he was more cheerful. Also he has noted that has come to accept his father illness.  Also, otalgia and allergies for few days (sinus congestion). No fever, chills, cough or sore throat.  Review of Systems No suicidal ideas. Denies nausea vomiting after stopping 20 mg of Lexapro abruptly. Had a few headaches but nothing unusual  Past Medical History:  Diagnosis Date  . Adult ADHD 2017   [place], predominantly inattentive presentation  . Attention deficit hyperactivity disorder (ADHD) 04/24/2016  . Depressive personality disorder 2017   [place]  . Erectile dysfunction     Past Surgical History:  Procedure Laterality Date  . HERNIA REPAIR     umbilical, at age 48 y/o    Social History   Socioeconomic History  . Marital status: Married    Spouse name: Not on file  . Number of children: 2  . Years of education: Not on file  . Highest education level: Not on file  Occupational History  . Occupation: IT    Employer: [employer]  Social Needs  . Financial resource strain: Not on file  . Food insecurity:    Worry: Not on file    Inability: Not on file  . Transportation needs:    Medical: Not on file    Non-medical: Not on file  Tobacco Use  . Smoking status: Never Smoker  . Smokeless tobacco: Never Used  Substance and Sexual Activity  . Alcohol use: Yes    Comment: socially   . Drug use: No  . Sexual activity: Not on file  Lifestyle  .  Physical activity:    Days per week: Not on file    Minutes per session: Not on file  . Stress: Not on file  Relationships  . Social connections:    Talks on phone: Not on file    Gets together: Not on file    Attends religious service: Not on file    Active member of club or organization: Not on file    Attends meetings of clubs or organizations: Not on file    Relationship status: Not on file  . Intimate partner violence:    Fear of current or ex partner: Not on file    Emotionally abused: Not on file    Physically abused: Not on file    Forced sexual activity: Not on file  Other Topics Concern  . Not on file  Social History Narrative   Household-- pt , wife ,2009, 2006      Allergies as of 02/27/2018   No Known Allergies     Medication List        Accurate as of 02/27/18  8:31 AM. Always use your most recent med list.          amphetamine-dextroamphetamine 10 MG tablet Commonly known as:  ADDERALL Take 2 tablets by mouth in AM, and 1 tablet by mouth  in PM as needed.   escitalopram 20 MG tablet Commonly known as:  LEXAPRO Take 1 tablet (20 mg total) by mouth daily.   sildenafil 20 MG tablet Commonly known as:  REVATIO TAKE 3 TO 4 TABLETS (60-80 MG TOTAL) BY MOUTH AT BEDTIME AS NEEDED.          Objective:   Physical Exam BP 126/68 (BP Location: Left Arm, Patient Position: Sitting, Cuff Size: Normal)   Pulse 94   Temp 98 F (36.7 C) (Oral)   Resp 16   Ht 5\' 5"  (1.651 m)   Wt 211 lb 8 oz (95.9 kg)   SpO2 97%   BMI 35.20 kg/m  General:   Well developed, well nourished . NAD.  HEENT:  Normocephalic . Face symmetric, atraumatic TMs normal.  Nose not congested, throat symmetric, no red Neurologic:  alert & oriented X3.  Speech normal, gait appropriate for age and unassisted Psych--  Cognition and judgment appear intact.  Cooperative with normal attention span and concentration.  Behavior appropriate. No anxious or depressed appearing.        Assessment & Plan:   Assessment ADHD (confirmed by psychology 03-2016) Depression (dx by psych 03-2016) ED  PLAN: Depression: Since the last visit, he increase Lexapro to 20 mg, then few days ago he forgot to take it and eventually stopped it.  Feeling well at this point, on looking back he thinks he has learn how to deal with his father illnesses.  Also in a way he felt better when he was taking Lexapro 10 mg rather than 20. At this point we agreed he will stay away from Lexapro but restarted if he feels it is necessary at a 10 mg dose. Will call for a refill. ADD: Contract signed, good compliance, good results and no apparent side effects. Otalgia, normal exam, will treat allergies with OTC Xyzal. RTC around 05-2018 CPX

## 2018-02-27 NOTE — Progress Notes (Signed)
Pre visit review using our clinic review tool, if applicable. No additional management support is needed unless otherwise documented below in the visit note. 

## 2018-02-27 NOTE — Patient Instructions (Signed)
  GO TO THE FRONT DESK Schedule your next appointment for a  Physical by 05-2018  Go back on lexapro 10 mg daily if needed, please let me know if you decide to do that

## 2018-02-27 NOTE — Assessment & Plan Note (Signed)
Depression: Since the last visit, he increase Lexapro to 20 mg, then few days ago he forgot to take it and eventually stopped it.  Feeling well at this point, on looking back he thinks he has learn how to deal with his father illnesses.  Also in a way he felt better when he was taking Lexapro 10 mg rather than 20. At this point we agreed he will stay away from Lexapro but restarted if he feels it is necessary at a 10 mg dose. Will call for a refill. ADD: Contract signed, good compliance, good results and no apparent side effects. Otalgia, normal exam, will treat allergies with OTC Xyzal. RTC around 05-2018 CPX

## 2018-02-28 ENCOUNTER — Other Ambulatory Visit: Payer: Self-pay | Admitting: Internal Medicine

## 2018-04-17 ENCOUNTER — Encounter: Payer: Self-pay | Admitting: Internal Medicine

## 2018-04-17 ENCOUNTER — Other Ambulatory Visit: Payer: Self-pay | Admitting: Internal Medicine

## 2018-04-18 MED ORDER — AMPHETAMINE-DEXTROAMPHETAMINE 10 MG PO TABS: ORAL_TABLET | ORAL | 0 refills | Status: DC

## 2018-04-18 NOTE — Telephone Encounter (Signed)
Sent!

## 2018-04-18 NOTE — Telephone Encounter (Signed)
Pt is requesting refill on Adderall.   Last OV: 02/27/2018 Last Fill: 02/13/2018 #180 and 0RF (Pt sig: 2 tabs in AM and 1 in PM prn) UDS: 05/17/2017 Low risk  Needs UDS at next OV  NCCR in media from 02/15/2018- no discrepancies noted  Please advise.

## 2018-05-24 ENCOUNTER — Encounter: Payer: BLUE CROSS/BLUE SHIELD | Admitting: Internal Medicine

## 2018-06-04 ENCOUNTER — Ambulatory Visit (INDEPENDENT_AMBULATORY_CARE_PROVIDER_SITE_OTHER): Payer: BLUE CROSS/BLUE SHIELD | Admitting: Internal Medicine

## 2018-06-04 ENCOUNTER — Encounter: Payer: Self-pay | Admitting: Internal Medicine

## 2018-06-04 VITALS — BP 122/78 | HR 85 | Temp 97.8°F | Resp 14 | Ht 65.0 in | Wt 220.2 lb

## 2018-06-04 DIAGNOSIS — F900 Attention-deficit hyperactivity disorder, predominantly inattentive type: Secondary | ICD-10-CM

## 2018-06-04 DIAGNOSIS — Z79899 Other long term (current) drug therapy: Secondary | ICD-10-CM

## 2018-06-04 DIAGNOSIS — R739 Hyperglycemia, unspecified: Secondary | ICD-10-CM | POA: Diagnosis not present

## 2018-06-04 DIAGNOSIS — Z0000 Encounter for general adult medical examination without abnormal findings: Secondary | ICD-10-CM

## 2018-06-04 NOTE — Progress Notes (Signed)
Subjective:    Patient ID: Carl Beltran, male    DOB: 06/05/1976, 42 y.o.   MRN: [id number]  DOS:  06/04/2018 Type of visit - description :cpx  Interval history: No major concerns  Wt Readings from Last 3 Encounters:  06/04/18 220 lb 4 oz (99.9 kg)  02/27/18 211 lb 8 oz (95.9 kg)  10/02/17 206 lb 8 oz (93.7 kg)    Review of Systems  A 14 point review of systems is negative    Past Medical History:  Diagnosis Date  . Adult ADHD 2017   [place], predominantly inattentive presentation  . Attention deficit hyperactivity disorder (ADHD) 04/24/2016  . Depressive personality disorder 2017   [place]  . Erectile dysfunction     Past Surgical History:  Procedure Laterality Date  . HERNIA REPAIR     umbilical, at age 66 y/o    Social History   Socioeconomic History  . Marital status: Married    Spouse name: Not on file  . Number of children: 2  . Years of education: Not on file  . Highest education level: Not on file  Occupational History  . Occupation: IT    Employer: [employer]  Social Needs  . Financial resource strain: Not on file  . Food insecurity:    Worry: Not on file    Inability: Not on file  . Transportation needs:    Medical: Not on file    Non-medical: Not on file  Tobacco Use  . Smoking status: Never Smoker  . Smokeless tobacco: Never Used  Substance and Sexual Activity  . Alcohol use: Yes    Comment: socially   . Drug use: No  . Sexual activity: Not on file  Lifestyle  . Physical activity:    Days per week: Not on file    Minutes per session: Not on file  . Stress: Not on file  Relationships  . Social connections:    Talks on phone: Not on file    Gets together: Not on file    Attends religious service: Not on file    Active member of club or organization: Not on file    Attends meetings of clubs or organizations: Not on file    Relationship status: Not on file  . Intimate partner violence:    Fear of  current or ex partner: Not on file    Emotionally abused: Not on file    Physically abused: Not on file    Forced sexual activity: Not on file  Other Topics Concern  . Not on file  Social History Narrative   Household-- pt , wife ,2009, 2006     Family History  Problem Relation Age of Onset  . CAD Other        GF  . Tongue cancer Father   . Colon cancer Neg Hx   . Prostate cancer Neg Hx   . Diabetes Neg Hx      Allergies as of 06/04/2018   No Known Allergies     Medication List        Accurate as of 06/04/18 11:59 PM. Always use your most recent med list.          amphetamine-dextroamphetamine 10 MG tablet Commonly known as:  ADDERALL Take 2 tablets by mouth in AM, and 1 tablet by mouth in PM as needed.   sildenafil 20 MG tablet Commonly known as:  REVATIO TAKE 3 TO 4 TABLETS (60-80 MG TOTAL)  BY MOUTH AT BEDTIME AS NEEDED.          Objective:   Physical Exam BP 122/78 (BP Location: Left Arm, Patient Position: Sitting, Cuff Size: Small)   Pulse 85   Temp 97.8 F (36.6 C) (Oral)   Resp 14   Ht 5\' 5"  (1.651 m)   Wt 220 lb 4 oz (99.9 kg)   SpO2 97%   BMI 36.65 kg/m   General: Well developed, NAD, see BMI.  Neck: No  thyromegaly  HEENT:  Normocephalic . Face symmetric, atraumatic Lungs:  CTA B Normal respiratory effort, no intercostal retractions, no accessory muscle use. Heart: RRR,  no murmur.  No pretibial edema bilaterally  Abdomen:  Not distended, soft, non-tender. No rebound or rigidity.   Skin: Exposed areas without rash. Not pale. Not jaundice Neurologic:  alert & oriented X3.  Speech normal, gait appropriate for age and unassisted Strength symmetric and appropriate for age.  Psych: Cognition and judgment appear intact.  Cooperative with normal attention span and concentration.  Behavior appropriate. No anxious or depressed appearing.     Assessment & Plan:    Assessment ADHD (confirmed by psychology 03-2016) Depression (dx by  psych 03-2016) ED  PLAN: ADD: On Adderall, UDS today, refill as needed, next visit in 1 year.  Aware that we might ask him to do a UDS in few months Weight gain: Counseled, denies any fatigue (OSA?) at this point. RTC 1 year

## 2018-06-04 NOTE — Patient Instructions (Signed)
GO TO THE LAB : Get the blood work     GO TO THE FRONT DESK Schedule your next appointment   for a physical exam in 1 year 

## 2018-06-04 NOTE — Progress Notes (Signed)
Pre visit review using our clinic review tool, if applicable. No additional management support is needed unless otherwise documented below in the visit note. 

## 2018-06-04 NOTE — Assessment & Plan Note (Signed)
-  Td 2015 -Had a Colonoscopy in 2008 for rectal bleeding, now w/ no sx  -Diet and exercise discussed.  Calorie counting? -LABS : CMP, FLP, A1c, TSH, UDS

## 2018-06-05 LAB — LIPID PANEL
CHOLESTEROL: 195 mg/dL (ref 0–200)
HDL: 37.3 mg/dL — AB (ref >39.00)
NonHDL: 157.83
Total CHOL/HDL Ratio: 5
Triglycerides: 284 mg/dL — ABNORMAL HIGH (ref 0.0–149.0)
VLDL: 56.8 mg/dL — ABNORMAL HIGH (ref 0.0–40.0)

## 2018-06-05 LAB — COMPREHENSIVE METABOLIC PANEL
ALBUMIN: 4.4 g/dL (ref 3.5–5.2)
ALK PHOS: 67 U/L (ref 39–117)
ALT: 32 U/L (ref 0–53)
AST: 19 U/L (ref 0–37)
BILIRUBIN TOTAL: 0.7 mg/dL (ref 0.2–1.2)
BUN: 8 mg/dL (ref 6–23)
CALCIUM: 9.5 mg/dL (ref 8.4–10.5)
CO2: 29 mEq/L (ref 19–32)
Chloride: 103 mEq/L (ref 96–112)
Creatinine, Ser: 0.96 mg/dL (ref 0.40–1.50)
GFR: 91.28 mL/min (ref >60.00)
Glucose, Bld: 93 mg/dL (ref 70–99)
POTASSIUM: 4.2 meq/L (ref 3.5–5.1)
Sodium: 139 mEq/L (ref 135–145)
TOTAL PROTEIN: 7.1 g/dL (ref 6.0–8.3)

## 2018-06-05 LAB — LDL CHOLESTEROL, DIRECT: Direct LDL: 58 mg/dL

## 2018-06-05 LAB — HEMOGLOBIN A1C: HEMOGLOBIN A1C: 5.4 % (ref 4.6–6.5)

## 2018-06-05 LAB — TSH: TSH: 1.4 u[IU]/mL (ref 0.35–4.50)

## 2018-06-05 NOTE — Assessment & Plan Note (Signed)
ADD: On Adderall, UDS today, refill as needed, next visit in 1 year.  Aware that we might ask him to do a UDS in few months Weight gain: Counseled, denies any fatigue (OSA?) at this point. RTC 1 year

## 2018-06-06 LAB — PAIN MGMT, PROFILE 8 W/CONF, U
6 ACETYLMORPHINE: NEGATIVE ng/mL (ref <10)
AMPHETAMINE: 9325 ng/mL — AB (ref <250)
Alcohol Metabolites: NEGATIVE ng/mL (ref <500)
Amphetamines: POSITIVE ng/mL — AB (ref <500)
BENZODIAZEPINES: NEGATIVE ng/mL (ref <100)
Buprenorphine, Urine: NEGATIVE ng/mL (ref <5)
Cocaine Metabolite: NEGATIVE ng/mL (ref <150)
Creatinine: 127 mg/dL
MDMA: NEGATIVE ng/mL (ref <500)
Marijuana Metabolite: NEGATIVE ng/mL (ref <20)
Methamphetamine: NEGATIVE ng/mL (ref <250)
OPIATES: NEGATIVE ng/mL (ref <100)
Oxidant: NEGATIVE ug/mL (ref <200)
Oxycodone: NEGATIVE ng/mL (ref <100)
pH: 6.44 (ref 4.5–9.0)

## 2018-06-09 ENCOUNTER — Encounter: Payer: Self-pay | Admitting: Internal Medicine

## 2018-06-09 ENCOUNTER — Other Ambulatory Visit: Payer: Self-pay | Admitting: Internal Medicine

## 2018-06-10 MED ORDER — AMPHETAMINE-DEXTROAMPHETAMINE 10 MG PO TABS: ORAL_TABLET | ORAL | 0 refills | Status: DC

## 2018-06-10 NOTE — Telephone Encounter (Signed)
Sent!

## 2018-06-10 NOTE — Telephone Encounter (Signed)
Pt is requesting refill on Adderall 10mg .   Last OV: 06/04/2018 Last Fill: 04/18/2018 #180 and 0RF (Pt sig: 2 tabs in AM and 1 in PM prn) UDS: 06/04/2018 Low risk  NCCR in media from 02/15/2018- no discrepancies   Please advise.

## 2018-06-23 DIAGNOSIS — S83242A Other tear of medial meniscus, current injury, left knee, initial encounter: Secondary | ICD-10-CM | POA: Diagnosis not present

## 2018-07-16 DIAGNOSIS — S83242A Other tear of medial meniscus, current injury, left knee, initial encounter: Secondary | ICD-10-CM | POA: Diagnosis not present

## 2018-07-16 DIAGNOSIS — S83241A Other tear of medial meniscus, current injury, right knee, initial encounter: Secondary | ICD-10-CM | POA: Diagnosis not present

## 2018-07-22 DIAGNOSIS — M25561 Pain in right knee: Secondary | ICD-10-CM | POA: Diagnosis not present

## 2018-07-22 DIAGNOSIS — M25562 Pain in left knee: Secondary | ICD-10-CM | POA: Diagnosis not present

## 2018-07-25 ENCOUNTER — Encounter: Payer: Self-pay | Admitting: Internal Medicine

## 2018-07-25 ENCOUNTER — Telehealth: Payer: BLUE CROSS/BLUE SHIELD | Admitting: Physician Assistant

## 2018-07-25 DIAGNOSIS — J0110 Acute frontal sinusitis, unspecified: Secondary | ICD-10-CM

## 2018-07-25 MED ORDER — AMOXICILLIN-POT CLAVULANATE 875-125 MG PO TABS: 1.0000 | ORAL_TABLET | Freq: Two times a day (BID) | ORAL | 0 refills | Status: DC

## 2018-07-25 NOTE — Progress Notes (Signed)

## 2018-07-26 ENCOUNTER — Other Ambulatory Visit: Payer: Self-pay | Admitting: Internal Medicine

## 2018-07-26 MED ORDER — AZELASTINE HCL 0.1 % NA SOLN: 2.0000 | Freq: Every evening | NASAL | 3 refills | Status: DC | PRN

## 2018-07-29 ENCOUNTER — Ambulatory Visit: Payer: BLUE CROSS/BLUE SHIELD | Admitting: Internal Medicine

## 2018-08-21 ENCOUNTER — Encounter: Payer: Self-pay | Admitting: Internal Medicine

## 2018-08-21 ENCOUNTER — Telehealth: Payer: Self-pay | Admitting: Internal Medicine

## 2018-08-21 MED ORDER — AMPHETAMINE-DEXTROAMPHETAMINE 10 MG PO TABS: ORAL_TABLET | ORAL | 0 refills | Status: DC

## 2018-08-21 NOTE — Telephone Encounter (Signed)
Pt is requesting refill on Adderall.   Last OV: 06/04/2018 Last Fill: 06/10/2018 #180 and 0RF UDS: 06/04/2018 Low risk

## 2018-08-21 NOTE — Telephone Encounter (Signed)
Sent!

## 2018-08-23 ENCOUNTER — Encounter: Payer: Self-pay | Admitting: Internal Medicine

## 2018-08-23 ENCOUNTER — Other Ambulatory Visit: Payer: Self-pay | Admitting: Internal Medicine

## 2018-09-20 DIAGNOSIS — M25562 Pain in left knee: Secondary | ICD-10-CM | POA: Diagnosis not present

## 2018-09-20 DIAGNOSIS — M25561 Pain in right knee: Secondary | ICD-10-CM | POA: Diagnosis not present

## 2018-09-20 DIAGNOSIS — S83241A Other tear of medial meniscus, current injury, right knee, initial encounter: Secondary | ICD-10-CM | POA: Diagnosis not present

## 2018-09-20 DIAGNOSIS — X58XXXA Exposure to other specified factors, initial encounter: Secondary | ICD-10-CM | POA: Diagnosis not present

## 2018-09-20 DIAGNOSIS — S83282A Other tear of lateral meniscus, current injury, left knee, initial encounter: Secondary | ICD-10-CM | POA: Diagnosis not present

## 2018-09-20 DIAGNOSIS — G8918 Other acute postprocedural pain: Secondary | ICD-10-CM | POA: Diagnosis not present

## 2018-09-20 DIAGNOSIS — Y999 Unspecified external cause status: Secondary | ICD-10-CM | POA: Diagnosis not present

## 2018-09-20 HISTORY — PX: KNEE CARTILAGE SURGERY: SHX688

## 2018-09-26 DIAGNOSIS — S83241D Other tear of medial meniscus, current injury, right knee, subsequent encounter: Secondary | ICD-10-CM | POA: Diagnosis not present

## 2018-09-27 DIAGNOSIS — R531 Weakness: Secondary | ICD-10-CM | POA: Diagnosis not present

## 2018-09-27 DIAGNOSIS — M25661 Stiffness of right knee, not elsewhere classified: Secondary | ICD-10-CM | POA: Diagnosis not present

## 2018-09-27 DIAGNOSIS — M25662 Stiffness of left knee, not elsewhere classified: Secondary | ICD-10-CM | POA: Diagnosis not present

## 2018-09-27 DIAGNOSIS — R262 Difficulty in walking, not elsewhere classified: Secondary | ICD-10-CM | POA: Diagnosis not present

## 2018-09-30 DIAGNOSIS — M25662 Stiffness of left knee, not elsewhere classified: Secondary | ICD-10-CM | POA: Diagnosis not present

## 2018-09-30 DIAGNOSIS — R531 Weakness: Secondary | ICD-10-CM | POA: Diagnosis not present

## 2018-09-30 DIAGNOSIS — M25661 Stiffness of right knee, not elsewhere classified: Secondary | ICD-10-CM | POA: Diagnosis not present

## 2018-09-30 DIAGNOSIS — R262 Difficulty in walking, not elsewhere classified: Secondary | ICD-10-CM | POA: Diagnosis not present

## 2018-10-07 DIAGNOSIS — R262 Difficulty in walking, not elsewhere classified: Secondary | ICD-10-CM | POA: Diagnosis not present

## 2018-10-07 DIAGNOSIS — R531 Weakness: Secondary | ICD-10-CM | POA: Diagnosis not present

## 2018-10-07 DIAGNOSIS — M25662 Stiffness of left knee, not elsewhere classified: Secondary | ICD-10-CM | POA: Diagnosis not present

## 2018-10-07 DIAGNOSIS — M25661 Stiffness of right knee, not elsewhere classified: Secondary | ICD-10-CM | POA: Diagnosis not present

## 2018-10-09 DIAGNOSIS — R262 Difficulty in walking, not elsewhere classified: Secondary | ICD-10-CM | POA: Diagnosis not present

## 2018-10-09 DIAGNOSIS — M25662 Stiffness of left knee, not elsewhere classified: Secondary | ICD-10-CM | POA: Diagnosis not present

## 2018-10-09 DIAGNOSIS — M25661 Stiffness of right knee, not elsewhere classified: Secondary | ICD-10-CM | POA: Diagnosis not present

## 2018-10-09 DIAGNOSIS — S83241D Other tear of medial meniscus, current injury, right knee, subsequent encounter: Secondary | ICD-10-CM | POA: Diagnosis not present

## 2018-10-14 DIAGNOSIS — R262 Difficulty in walking, not elsewhere classified: Secondary | ICD-10-CM | POA: Diagnosis not present

## 2018-10-14 DIAGNOSIS — M25661 Stiffness of right knee, not elsewhere classified: Secondary | ICD-10-CM | POA: Diagnosis not present

## 2018-10-14 DIAGNOSIS — R531 Weakness: Secondary | ICD-10-CM | POA: Diagnosis not present

## 2018-10-14 DIAGNOSIS — M25662 Stiffness of left knee, not elsewhere classified: Secondary | ICD-10-CM | POA: Diagnosis not present

## 2018-10-16 DIAGNOSIS — S83241D Other tear of medial meniscus, current injury, right knee, subsequent encounter: Secondary | ICD-10-CM | POA: Diagnosis not present

## 2018-10-16 DIAGNOSIS — M25662 Stiffness of left knee, not elsewhere classified: Secondary | ICD-10-CM | POA: Diagnosis not present

## 2018-10-16 DIAGNOSIS — M25661 Stiffness of right knee, not elsewhere classified: Secondary | ICD-10-CM | POA: Diagnosis not present

## 2018-10-16 DIAGNOSIS — R262 Difficulty in walking, not elsewhere classified: Secondary | ICD-10-CM | POA: Diagnosis not present

## 2018-10-21 DIAGNOSIS — M25662 Stiffness of left knee, not elsewhere classified: Secondary | ICD-10-CM | POA: Diagnosis not present

## 2018-10-21 DIAGNOSIS — R262 Difficulty in walking, not elsewhere classified: Secondary | ICD-10-CM | POA: Diagnosis not present

## 2018-10-21 DIAGNOSIS — M25661 Stiffness of right knee, not elsewhere classified: Secondary | ICD-10-CM | POA: Diagnosis not present

## 2018-10-21 DIAGNOSIS — R531 Weakness: Secondary | ICD-10-CM | POA: Diagnosis not present

## 2018-10-28 ENCOUNTER — Ambulatory Visit: Payer: Self-pay | Admitting: Physician Assistant

## 2018-10-28 ENCOUNTER — Encounter: Payer: Self-pay | Admitting: Internal Medicine

## 2018-10-28 ENCOUNTER — Encounter: Payer: Self-pay | Admitting: Physician Assistant

## 2018-10-28 ENCOUNTER — Other Ambulatory Visit: Payer: Self-pay | Admitting: Internal Medicine

## 2018-10-28 VITALS — BP 110/75 | HR 94 | Temp 98.4°F | Resp 18 | Wt 214.0 lb

## 2018-10-28 DIAGNOSIS — J101 Influenza due to other identified influenza virus with other respiratory manifestations: Secondary | ICD-10-CM

## 2018-10-28 DIAGNOSIS — R6889 Other general symptoms and signs: Secondary | ICD-10-CM

## 2018-10-28 LAB — POCT INFLUENZA A/B
Influenza A, POC: NEGATIVE
Influenza B, POC: POSITIVE — AB

## 2018-10-28 MED ORDER — AMPHETAMINE-DEXTROAMPHETAMINE 10 MG PO TABS: ORAL_TABLET | ORAL | 0 refills | Status: DC

## 2018-10-28 MED ORDER — PROMETHAZINE-DM 6.25-15 MG/5ML PO SYRP: 5.0000 mL | ORAL_SOLUTION | Freq: Four times a day (QID) | ORAL | 0 refills | Status: DC | PRN

## 2018-10-28 MED ORDER — IPRATROPIUM BROMIDE 0.03 % NA SOLN: 2.0000 | Freq: Two times a day (BID) | NASAL | 0 refills | Status: DC

## 2018-10-28 NOTE — Telephone Encounter (Signed)
NCCR printed- Pt received Norco 5-325mg  #20 tablets from [employer], PA-C on 09/20/2018, no other discrepancies noted

## 2018-10-28 NOTE — Telephone Encounter (Signed)
Pt is requesting refill on Adderall.   Last OV: 06/04/2018 Last Fill: 08/21/2018 #180 and 0RF UDS: 06/04/2018 Low risk

## 2018-10-28 NOTE — Patient Instructions (Addendum)
You have tested positive for the flu, you are contagious until you are fever free for 24 hours without using tylenol or ibuprofen. Use cough syrup at night time for cough. You can use over the counter dayquil for sinus and nasal congestion. Atrovent nasal spray for runny nose. Please stay out of work until you are no longer contagious. Two major complications after the flu are pneumonia and sinus infections. Please be aware of this and if you are not any better in 5-7 days or you develop worsening cough or sinus pressure, seek care at our clinic or the ED. Continue to wash your hands and wear a mask daily especially around other people.   Influenza, Adult Influenza is also called "the flu." It is an infection in the lungs, nose, and throat (respiratory tract). It is caused by a virus. The flu causes symptoms that are similar to symptoms of a cold. It also causes a high fever and body aches. The flu spreads easily from person to person (is contagious). Getting a flu shot (influenza vaccination) every year is the best way to prevent the flu. What are the causes? This condition is caused by the influenza virus. You can get the virus by:  Breathing in droplets that are in the air from the cough or sneeze of a person who has the virus.  Touching something that has the virus on it (is contaminated) and then touching your mouth, nose, or eyes. What increases the risk? Certain things may make you more likely to get the flu. These include:  Not washing your hands often.  Having close contact with many people during cold and flu season.  Touching your mouth, eyes, or nose without first washing your hands.  Not getting a flu shot every year. You may have a higher risk for the flu, along with serious problems such as a lung infection (pneumonia), if you:  Are older than 65.  Are pregnant.  Have a weakened disease-fighting system (immune system) because of a disease or taking certain medicines.  Have  a long-term (chronic) illness, such as: ? Heart, kidney, or lung disease. ? Diabetes. ? Asthma.  Have a liver disorder.  Are very overweight (morbidly obese).  Have anemia. This is a condition that affects your red blood cells. What are the signs or symptoms? Symptoms usually begin suddenly and last 4-14 days. They may include:  Fever and chills.  Headaches, body aches, or muscle aches.  Sore throat.  Cough.  Runny or stuffy (congested) nose.  Chest discomfort.  Not wanting to eat as much as normal (poor appetite).  Weakness or feeling tired (fatigue).  Dizziness.  Feeling sick to your stomach (nauseous) or throwing up (vomiting). How is this treated? If the flu is found early, you can be treated with medicine that can help reduce how bad the illness is and how long it lasts (antiviral medicine). This may be given by mouth (orally) or through an IV tube. Taking care of yourself at home can help your symptoms get better. Your doctor may suggest:  Taking over-the-counter medicines.  Drinking plenty of fluids. The flu often goes away on its own. If you have very bad symptoms or other problems, you may be treated in a hospital. Follow these instructions at home:     Activity  Rest as needed. Get plenty of sleep.  Stay home from work or school as told by your doctor. ? Do not leave home until you do not have a fever for  24 hours without taking medicine. ? Leave home only to visit your doctor. Eating and drinking  Take an ORS (oral rehydration solution). This is a drink that is sold at pharmacies and stores.  Drink enough fluid to keep your pee (urine) pale yellow.  Drink clear fluids in small amounts as you are able. Clear fluids include: ? Water. ? Ice chips. ? Fruit juice that has water added (diluted fruit juice). ? Low-calorie sports drinks.  Eat bland, easy-to-digest foods in small amounts as you are able. These foods  include: ? Bananas. ? Applesauce. ? Rice. ? Lean meats. ? Toast. ? Crackers.  Do not eat or drink: ? Fluids that have a lot of sugar or caffeine. ? Alcohol. ? Spicy or fatty foods. General instructions  Take over-the-counter and prescription medicines only as told by your doctor.  Use a cool mist humidifier to add moisture to the air in your home. This can make it easier for you to breathe.  Cover your mouth and nose when you cough or sneeze.  Wash your hands with soap and water often, especially after you cough or sneeze. If you cannot use soap and water, use alcohol-based hand sanitizer.  Keep all follow-up visits as told by your doctor. This is important. How is this prevented?   Get a flu shot every year. You may get the flu shot in late summer, fall, or winter. Ask your doctor when you should get your flu shot.  Avoid contact with people who are sick during fall and winter (cold and flu season). Contact a doctor if:  You get new symptoms.  You have: ? Chest pain. ? Watery poop (diarrhea). ? A fever.  Your cough gets worse.  You start to have more mucus.  You feel sick to your stomach.  You throw up. Get help right away if you:  Have shortness of breath.  Have trouble breathing.  Have skin or nails that turn a bluish color.  Have very bad pain or stiffness in your neck.  Get a sudden headache.  Get sudden pain in your face or ear.  Cannot eat or drink without throwing up. Summary  Influenza ("the flu") is an infection in the lungs, nose, and throat. It is caused by a virus.  Take over-the-counter and prescription medicines only as told by your doctor.  Getting a flu shot every year is the best way to avoid getting the flu. This information is not intended to replace advice given to you by your health care provider. Make sure you discuss any questions you have with your health care provider. Document Released: 08/01/2008 Document Revised:  04/10/2018 Document Reviewed: 04/10/2018 Elsevier Interactive Patient Education  2019 [employer].

## 2018-10-28 NOTE — Telephone Encounter (Signed)
Sent!

## 2018-10-28 NOTE — Progress Notes (Signed)
MRN: [id number] DOB: [birth year]  Subjective:   Carl Beltran is a 42 y.o. male presenting for chief complaint of Cough (x 1 day); Fever (x 1 day, 102.0 F); Generalized Body Aches (X 2 DAYS); Chills (X 2 DAYS); Nasal Congestion (X 2 DAYS); and Ear Fullness (X 2 DAYS) .  Reports 4 day history of flu like illness. Started with sudden onset body aches, chills, fever, runny nose, ear fullness, and intermittent dry cough. Fever resolved 2 days ago. Denies productive cough, wheezing, SOB, chest pain, sinus pain, N/V/D.  Has not tried anything for relief. Sick exposure to his dad. No history of seasonal allergies, asthma, COPD, DM, HTN, thyroid disorder, or other immunocompromising conditions.  Patient has not had flu shot this season. Denies smoking. Last tx for sinus infection in 07/2018.  Denies any other aggravating or relieving factors, no other questions or concerns.  Review of Systems  Constitutional: Negative for diaphoresis and weight loss.  Respiratory: Negative for hemoptysis and sputum production.   Cardiovascular: Negative for palpitations.  Gastrointestinal: Negative for abdominal pain.  Musculoskeletal: Negative for neck pain.  Neurological: Negative for dizziness and headaches.    [name] has a current medication list which includes the following prescription(s): amphetamine-dextroamphetamine, pseudoephedrine-guaifenesin, amoxicillin-clavulanate, azelastine, and sildenafil. Also has No Known Allergies.  Carl Beltran  has a past medical history of Adult ADHD (2017), Attention deficit hyperactivity disorder (ADHD) (04/24/2016), Depressive personality disorder (2017), and Erectile dysfunction. Also  has a past surgical history that includes Hernia repair.   Objective:   Vitals: BP 110/75 (BP Location: Right Arm, Patient Position: Sitting, Cuff Size: Normal)   Pulse 94   Temp 98.4 F (36.9 C) (Oral)   Resp 18   Wt 214 lb (97.1 kg)   SpO2 96%   BMI 35.61 kg/m   Physical Exam Vitals signs  reviewed.  Constitutional:      General: He is not in acute distress.    Appearance: Normal appearance. He is well-developed. He is not toxic-appearing.  HENT:     Head: Normocephalic and atraumatic.     Right Ear: Ear canal and external ear normal. Tympanic membrane is injected. Tympanic membrane is not erythematous or bulging.     Left Ear: Ear canal and external ear normal. Tympanic membrane is injected. Tympanic membrane is not erythematous or bulging.     Nose: Congestion and rhinorrhea present.     Right Sinus: No maxillary sinus tenderness or frontal sinus tenderness.     Left Sinus: No maxillary sinus tenderness or frontal sinus tenderness.     Mouth/Throat:     Lips: Pink.     Mouth: Mucous membranes are moist.     Pharynx: Oropharynx is clear. Uvula midline. No posterior oropharyngeal erythema.     Tonsils: No tonsillar exudate or tonsillar abscesses.  Eyes:     Extraocular Movements: Extraocular movements intact.     Conjunctiva/sclera: Conjunctivae normal.     Pupils: Pupils are equal, round, and reactive to light.  Neck:     Musculoskeletal: Normal range of motion.  Cardiovascular:     Rate and Rhythm: Normal rate and regular rhythm.     Heart sounds: Normal heart sounds.  Pulmonary:     Effort: Pulmonary effort is normal.     Breath sounds: Normal breath sounds. No decreased breath sounds, wheezing, rhonchi or rales.  Lymphadenopathy:     Head:     Right side of head: No submental, submandibular, tonsillar, preauricular, posterior auricular or occipital adenopathy.  Left side of head: No submental, submandibular, tonsillar, preauricular, posterior auricular or occipital adenopathy.     Cervical: No cervical adenopathy.     Upper Body:     Right upper body: No supraclavicular adenopathy.     Left upper body: No supraclavicular adenopathy.  Skin:    General: Skin is warm and dry.  Neurological:     Mental Status: He is alert and oriented to person, place, and  time.     Results for orders placed or performed in visit on 10/28/18 (from the past 24 hour(s))  POCT Influenza A/B     Status: Abnormal   Collection Time: 10/28/18  9:14 AM  Result Value Ref Range   Influenza A, POC Negative Negative   Influenza B, POC Positive (A) Negative    Assessment and Plan :  1. Influenza B POC test positive for influenza B. He is overall healthy low risk individual. Sx are mild and have been present >48 hours. Do not rec antiviral tx at this time.Would recommend symptomatic tx. Educated on supportive measures and potential complications of the flu.  F/u with [place], family doctor, or local urgent care/ED if no improvement, sx worsen, or he develops new concerning sx.  - pseudoephedrine-guaifenesin (MUCINEX D) 60-600 MG 12 hr tablet; Take 1 tablet by mouth every 12 (twelve) hours. - promethazine-dextromethorphan (PROMETHAZINE-DM) 6.25-15 MG/5ML syrup; Take 5 mLs by mouth 4 (four) times daily as needed for cough.  Dispense: 118 mL; Refill: 0 - ipratropium (ATROVENT) 0.03 % nasal spray; Place 2 sprays into both nostrils 2 (two) times daily.  Dispense: 30 mL; Refill: 0  2. Flu-like symptoms - POCT Influenza A/B    Tenna Delaine, PA-C  [facility] 10/28/2018 9:24 AM

## 2018-10-31 DIAGNOSIS — M25662 Stiffness of left knee, not elsewhere classified: Secondary | ICD-10-CM | POA: Diagnosis not present

## 2018-10-31 DIAGNOSIS — S83241D Other tear of medial meniscus, current injury, right knee, subsequent encounter: Secondary | ICD-10-CM | POA: Diagnosis not present

## 2018-10-31 DIAGNOSIS — R262 Difficulty in walking, not elsewhere classified: Secondary | ICD-10-CM | POA: Diagnosis not present

## 2018-10-31 DIAGNOSIS — M25661 Stiffness of right knee, not elsewhere classified: Secondary | ICD-10-CM | POA: Diagnosis not present

## 2018-11-02 ENCOUNTER — Telehealth: Payer: BLUE CROSS/BLUE SHIELD | Admitting: Nurse Practitioner

## 2018-11-02 DIAGNOSIS — R059 Cough, unspecified: Secondary | ICD-10-CM

## 2018-11-02 DIAGNOSIS — R05 Cough: Secondary | ICD-10-CM

## 2018-11-02 MED ORDER — PREDNISONE 10 MG (21) PO TBPK: ORAL_TABLET | ORAL | 0 refills | Status: DC

## 2018-11-02 MED ORDER — BENZONATATE 100 MG PO CAPS: 100.0000 mg | ORAL_CAPSULE | Freq: Three times a day (TID) | ORAL | 0 refills | Status: DC | PRN

## 2018-11-02 NOTE — Progress Notes (Signed)
We are sorry that you are not feeling well.  Here is how we plan to help!  Based on your presentation I believe you most likely have A cough due to a virus.  This is called viral bronchitis and is best treated by rest, plenty of fluids and control of the cough.  You may use Ibuprofen or Tylenol as directed to help your symptoms.     In addition you may use A prescription cough medication called Tessalon Perles 100mg. You may take 1-2 capsules every 8 hours as needed for your cough.  Prednisone 10 mg daily for 6 days (see taper instructions below)  Directions for 6 day taper: Day 1: 2 tablets before breakfast, 1 after both lunch & dinner and 2 at bedtime Day 2: 1 tab before breakfast, 1 after both lunch & dinner and 2 at bedtime Day 3: 1 tab at each meal & 1 at bedtime Day 4: 1 tab at breakfast, 1 at lunch, 1 at bedtime Day 5: 1 tab at breakfast & 1 tab at bedtime Day 6: 1 tab at breakfast   From your responses in the eVisit questionnaire you describe inflammation in the upper respiratory tract which is causing a significant cough.  This is commonly called Bronchitis and has four common causes:    Allergies  Viral Infections  Acid Reflux  Bacterial Infection Allergies, viruses and acid reflux are treated by controlling symptoms or eliminating the cause. An example might be a cough caused by taking certain blood pressure medications. You stop the cough by changing the medication. Another example might be a cough caused by acid reflux. Controlling the reflux helps control the cough.  USE OF BRONCHODILATOR ("RESCUE") INHALERS: There is a risk from using your bronchodilator too frequently.  The risk is that over-reliance on a medication which only relaxes the muscles surrounding the breathing tubes can reduce the effectiveness of medications prescribed to reduce swelling and congestion of the tubes themselves.  Although you feel brief relief from the bronchodilator inhaler, your asthma may  actually be worsening with the tubes becoming more swollen and filled with mucus.  This can delay other crucial treatments, such as oral steroid medications. If you need to use a bronchodilator inhaler daily, several times per day, you should discuss this with your provider.  There are probably better treatments that could be used to keep your asthma under control.     HOME CARE . Only take medications as instructed by your medical team. . Complete the entire course of an antibiotic. . Drink plenty of fluids and get plenty of rest. . Avoid close contacts especially the very young and the elderly . Cover your mouth if you cough or cough into your sleeve. . Always remember to wash your hands . A steam or ultrasonic humidifier can help congestion.   GET HELP RIGHT AWAY IF: . You develop worsening fever. . You become short of breath . You cough up blood. . Your symptoms persist after you have completed your treatment plan MAKE SURE YOU   Understand these instructions.  Will watch your condition.  Will get help right away if you are not doing well or get worse.  Your e-visit answers were reviewed by a board certified advanced clinical practitioner to complete your personal care plan.  Depending on the condition, your plan could have included both over the counter or prescription medications. If there is a problem please reply  once you have received a response from your provider. Your   safety is important to [place].  If you have drug allergies check your prescription carefully.    You can use MyChart to ask questions about today's visit, request a non-urgent call back, or ask for a work or school excuse for 24 hours related to this e-Visit. If it has been greater than 24 hours you will need to follow up with your provider, or enter a new e-Visit to address those concerns. You will get an e-mail in the next two days asking about your experience.  I hope that your e-visit has been valuable and will  speed your recovery. Thank you for using e-visits.

## 2018-12-10 DIAGNOSIS — M25661 Stiffness of right knee, not elsewhere classified: Secondary | ICD-10-CM | POA: Diagnosis not present

## 2019-01-07 DIAGNOSIS — M25661 Stiffness of right knee, not elsewhere classified: Secondary | ICD-10-CM | POA: Diagnosis not present

## 2019-01-07 DIAGNOSIS — M25662 Stiffness of left knee, not elsewhere classified: Secondary | ICD-10-CM | POA: Diagnosis not present

## 2019-01-13 ENCOUNTER — Other Ambulatory Visit: Payer: Self-pay | Admitting: Internal Medicine

## 2019-01-13 ENCOUNTER — Encounter: Payer: Self-pay | Admitting: Physician Assistant

## 2019-01-13 ENCOUNTER — Encounter: Payer: Self-pay | Admitting: Internal Medicine

## 2019-01-13 MED ORDER — AMPHETAMINE-DEXTROAMPHETAMINE 10 MG PO TABS: ORAL_TABLET | ORAL | 0 refills | Status: DC

## 2019-01-13 NOTE — Telephone Encounter (Signed)
Advise patient, 90-day supply sent to his pharmacy.

## 2019-01-13 NOTE — Telephone Encounter (Signed)
Pt is requesting refill on Adderall.   Last OV: 06/04/2018 Last Fill: 10/28/2018 #180 and 0RF Pt sig: 2 tabs in AM, 1 in PM UDS: 06/04/2018 Low risk

## 2019-02-24 ENCOUNTER — Other Ambulatory Visit: Payer: Self-pay | Admitting: Internal Medicine

## 2019-03-20 ENCOUNTER — Encounter: Payer: Self-pay | Admitting: Internal Medicine

## 2019-03-20 ENCOUNTER — Encounter: Payer: Self-pay | Admitting: Physician Assistant

## 2019-03-20 ENCOUNTER — Other Ambulatory Visit: Payer: Self-pay | Admitting: Internal Medicine

## 2019-03-21 ENCOUNTER — Telehealth: Payer: Self-pay

## 2019-03-21 MED ORDER — AMPHETAMINE-DEXTROAMPHETAMINE 10 MG PO TABS: ORAL_TABLET | ORAL | 0 refills | Status: DC

## 2019-03-21 NOTE — Telephone Encounter (Signed)
Adderall refill.   Last OV: 06/04/2018 Last Fill: 01/13/2019 #180 and 0RF Pt sig: 2 tabs in AM and 1 in PM  UDS: 06/04/2018 Low risk

## 2019-03-21 NOTE — Telephone Encounter (Signed)
Sent!

## 2019-05-16 ENCOUNTER — Other Ambulatory Visit: Payer: Self-pay | Admitting: Internal Medicine

## 2019-05-16 ENCOUNTER — Encounter: Payer: Self-pay | Admitting: Internal Medicine

## 2019-05-20 ENCOUNTER — Ambulatory Visit (INDEPENDENT_AMBULATORY_CARE_PROVIDER_SITE_OTHER): Payer: BC Managed Care – PPO | Admitting: Internal Medicine

## 2019-05-20 ENCOUNTER — Other Ambulatory Visit: Payer: Self-pay

## 2019-05-20 DIAGNOSIS — F900 Attention-deficit hyperactivity disorder, predominantly inattentive type: Secondary | ICD-10-CM | POA: Diagnosis not present

## 2019-05-20 MED ORDER — AMPHETAMINE-DEXTROAMPHETAMINE 10 MG PO TABS: ORAL_TABLET | ORAL | 0 refills | Status: DC

## 2019-05-20 NOTE — Progress Notes (Signed)
Subjective:    Patient ID: Carl Beltran, male    DOB: [birth year], 43 y.o.   MRN: [id number]  DOS:  05/20/2019 Type of visit - description:  Attempted  to make this a video visit, due to technical difficulties from the patient side it was not possible  thus we proceeded with a Virtual Visit via Telephone    I connected with@ on 05/20/19 at  1:20 PM EDT by telephone and verified that I am speaking with the correct person using two identifiers.  THIS ENCOUNTER IS A VIRTUAL VISIT DUE TO COVID-19 - PATIENT WAS NOT SEEN IN THE OFFICE. PATIENT HAS CONSENTED TO VIRTUAL VISIT / TELEMEDICINE VISIT   Location of patient: home  Location of provider: office  I discussed the limitations, risks, security and privacy concerns of performing an evaluation and management service by telephone and the availability of in person appointments. I also discussed with the patient that there may be a patient responsible charge related to this service. The patient expressed understanding and agreed to proceed.   History of Present Illness: Acute Patient is doing well, needs a refill on Adderall.  It is working well for him.     Review of Systems Denies anxiety, depression or insomnia Had knee surgery November, currently on ibuprofen as needed only.  Past Medical History:  Diagnosis Date  . Adult ADHD 2017   [place], predominantly inattentive presentation  . Attention deficit hyperactivity disorder (ADHD) 04/24/2016  . Depressive personality disorder 2017   [place]  . Erectile dysfunction     Past Surgical History:  Procedure Laterality Date  . HERNIA REPAIR     umbilical, at age 66 y/o    Social History   Socioeconomic History  . Marital status: Married    Spouse name: Not on file  . Number of children: 2  . Years of education: Not on file  . Highest education level: Not on file  Occupational History  . Occupation: IT    Employer: [employer]  Social  Needs  . Financial resource strain: Not on file  . Food insecurity    Worry: Not on file    Inability: Not on file  . Transportation needs    Medical: Not on file    Non-medical: Not on file  Tobacco Use  . Smoking status: Never Smoker  . Smokeless tobacco: Never Used  Substance and Sexual Activity  . Alcohol use: Yes    Comment: socially   . Drug use: No  . Sexual activity: Not on file  Lifestyle  . Physical activity    Days per week: Not on file    Minutes per session: Not on file  . Stress: Not on file  Relationships  . Social Herbalist on phone: Not on file    Gets together: Not on file    Attends religious service: Not on file    Active member of club or organization: Not on file    Attends meetings of clubs or organizations: Not on file    Relationship status: Not on file  . Intimate partner violence    Fear of current or ex partner: Not on file    Emotionally abused: Not on file    Physically abused: Not on file    Forced sexual activity: Not on file  Other Topics Concern  . Not on file  Social History Narrative   Household-- pt , wife ,2009, 2006  Allergies as of 05/20/2019   No Known Allergies     Medication List       Accurate as of May 20, 2019  1:25 PM. If you have any questions, ask your nurse or doctor.        amoxicillin-clavulanate 875-125 MG tablet Commonly known as: AUGMENTIN Take 1 tablet by mouth 2 (two) times daily.   amphetamine-dextroamphetamine 10 MG tablet Commonly known as: Adderall Take 2 tablets by mouth in AM, and 1 tablet by mouth in PM as needed.   azelastine 0.1 % nasal spray Commonly known as: ASTELIN Place 2 sprays into both nostrils at bedtime as needed for rhinitis. Use in each nostril as directed   benzonatate 100 MG capsule Commonly known as: Tessalon Perles Take 1 capsule (100 mg total) by mouth 3 (three) times daily as needed for cough.   ipratropium 0.03 % nasal spray Commonly known as:  ATROVENT Place 2 sprays into both nostrils 2 (two) times daily.   predniSONE 10 MG (21) Tbpk tablet Commonly known as: STERAPRED UNI-PAK 21 TAB As directed x 6 days   promethazine-dextromethorphan 6.25-15 MG/5ML syrup Commonly known as: PROMETHAZINE-DM Take 5 mLs by mouth 4 (four) times daily as needed for cough.   pseudoephedrine-guaifenesin 60-600 MG 12 hr tablet Commonly known as: MUCINEX D Take 1 tablet by mouth every 12 (twelve) hours.   sildenafil 20 MG tablet Commonly known as: REVATIO TAKE 3 -4 TABLETS BY MOUTH AT BEDTIME AS NEEDED.           Objective:   Physical Exam There were no vitals taken for this visit. This is a virtual visit, over the phone, alert oriented x3, no apparent distress    Assessment     Assessment ADHD (confirmed by psychology 03-2016) Depression (dx by psych 03-2016) ED   PLAN: ADD: Well-controlled, refill medications Depression: Not an issue at this point Had knee surgery November, doing well, on ibuprofen as needed only RTC within 3 months, CPX, will call and schedule.   I discussed the assessment and treatment plan with the patient. The patient was provided an opportunity to ask questions and all were answered. The patient agreed with the plan and demonstrated an understanding of the instructions.   The patient was advised to call back or seek an in-person evaluation if the symptoms worsen or if the condition fails to improve as anticipated.  I provided 11 minutes of non-face-to-face time during this encounter.  Kathlene November, MD

## 2019-05-22 NOTE — Assessment & Plan Note (Signed)
ADD: Well-controlled, refill medications Depression: Not an issue at this point Had knee surgery November, doing well, on ibuprofen as needed only RTC within 3 months, CPX, will call and schedule.

## 2019-06-18 ENCOUNTER — Other Ambulatory Visit: Payer: Self-pay | Admitting: Internal Medicine

## 2019-06-23 ENCOUNTER — Encounter: Payer: Self-pay | Admitting: Physician Assistant

## 2019-06-23 ENCOUNTER — Encounter: Payer: Self-pay | Admitting: Internal Medicine

## 2019-06-23 ENCOUNTER — Other Ambulatory Visit: Payer: Self-pay | Admitting: Internal Medicine

## 2019-06-23 MED ORDER — ESCITALOPRAM OXALATE 10 MG PO TABS: 10.0000 mg | ORAL_TABLET | Freq: Every day | ORAL | 1 refills | Status: DC

## 2019-07-18 ENCOUNTER — Telehealth: Payer: Self-pay | Admitting: Internal Medicine

## 2019-07-21 MED ORDER — AMPHETAMINE-DEXTROAMPHETAMINE 10 MG PO TABS: ORAL_TABLET | ORAL | 0 refills | Status: DC

## 2019-07-21 NOTE — Telephone Encounter (Signed)
Sent!

## 2019-07-21 NOTE — Telephone Encounter (Signed)
Adderall refill.   Last OV: 05/20/2019 Last Fill: 05/20/2019 #180 and 0RF UDS: 06/04/2018 Low risk

## 2019-08-05 ENCOUNTER — Other Ambulatory Visit: Payer: Self-pay

## 2019-08-06 ENCOUNTER — Encounter: Payer: Self-pay | Admitting: Internal Medicine

## 2019-08-06 ENCOUNTER — Ambulatory Visit: Payer: BC Managed Care – PPO | Admitting: Internal Medicine

## 2019-08-06 ENCOUNTER — Other Ambulatory Visit: Payer: Self-pay

## 2019-08-06 VITALS — BP 122/75 | HR 98 | Temp 97.5°F | Resp 16 | Ht 70.0 in | Wt 211.5 lb

## 2019-08-06 DIAGNOSIS — Z0000 Encounter for general adult medical examination without abnormal findings: Secondary | ICD-10-CM

## 2019-08-06 DIAGNOSIS — F900 Attention-deficit hyperactivity disorder, predominantly inattentive type: Secondary | ICD-10-CM | POA: Diagnosis not present

## 2019-08-06 DIAGNOSIS — Z79899 Other long term (current) drug therapy: Secondary | ICD-10-CM

## 2019-08-06 DIAGNOSIS — Z09 Encounter for follow-up examination after completed treatment for conditions other than malignant neoplasm: Secondary | ICD-10-CM

## 2019-08-06 DIAGNOSIS — Z23 Encounter for immunization: Secondary | ICD-10-CM | POA: Diagnosis not present

## 2019-08-06 LAB — CBC WITH DIFFERENTIAL/PLATELET
Basophils Absolute: 0.1 10*3/uL (ref 0.0–0.1)
Basophils Relative: 0.7 % (ref 0.0–3.0)
Eosinophils Absolute: 0.1 10*3/uL (ref 0.0–0.7)
Eosinophils Relative: 1.1 % (ref 0.0–5.0)
HCT: 42.6 % (ref 39.0–52.0)
Hemoglobin: 14.3 g/dL (ref 13.0–17.0)
Lymphocytes Relative: 23.7 % (ref 12.0–46.0)
Lymphs Abs: 2 10*3/uL (ref 0.7–4.0)
MCHC: 33.5 g/dL (ref 30.0–36.0)
MCV: 88.2 fl (ref 78.0–100.0)
Monocytes Absolute: 0.5 10*3/uL (ref 0.1–1.0)
Monocytes Relative: 5.8 % (ref 3.0–12.0)
Neutro Abs: 5.7 10*3/uL (ref 1.4–7.7)
Neutrophils Relative %: 68.7 % (ref 43.0–77.0)
Platelets: 328 10*3/uL (ref 150.0–400.0)
RBC: 4.82 Mil/uL (ref 4.22–5.81)
RDW: 13.4 % (ref 11.5–15.5)
WBC: 8.2 10*3/uL (ref 4.0–10.5)

## 2019-08-06 LAB — COMPREHENSIVE METABOLIC PANEL
ALT: 22 U/L (ref 0–53)
AST: 18 U/L (ref 0–37)
Albumin: 4.7 g/dL (ref 3.5–5.2)
Alkaline Phosphatase: 66 U/L (ref 39–117)
BUN: 13 mg/dL (ref 6–23)
CO2: 27 mEq/L (ref 19–32)
Calcium: 9.7 mg/dL (ref 8.4–10.5)
Chloride: 100 mEq/L (ref 96–112)
Creatinine, Ser: 0.98 mg/dL (ref 0.40–1.50)
GFR: 83.4 mL/min (ref >60.00)
Glucose, Bld: 84 mg/dL (ref 70–99)
Potassium: 4.2 mEq/L (ref 3.5–5.1)
Sodium: 138 mEq/L (ref 135–145)
Total Bilirubin: 0.6 mg/dL (ref 0.2–1.2)
Total Protein: 7.3 g/dL (ref 6.0–8.3)

## 2019-08-06 LAB — LIPID PANEL
Cholesterol: 203 mg/dL — ABNORMAL HIGH (ref 0–200)
HDL: 45.4 mg/dL (ref >39.00)
LDL Cholesterol: 126 mg/dL — ABNORMAL HIGH (ref 0–99)
NonHDL: 157.6
Total CHOL/HDL Ratio: 4
Triglycerides: 160 mg/dL — ABNORMAL HIGH (ref 0.0–149.0)
VLDL: 32 mg/dL (ref 0.0–40.0)

## 2019-08-06 MED ORDER — FLUOXETINE HCL 20 MG PO TABS: 30.0000 mg | ORAL_TABLET | Freq: Every day | ORAL | 0 refills | Status: DC

## 2019-08-06 NOTE — Patient Instructions (Signed)
GO TO THE LAB : Get the blood work     GO TO THE FRONT DESK Schedule your next appointment   for a checkup in 6 weeks  Stop Lexapro  Start fluoxetine 20 mg: The first 10-day take only 1 tablet daily After that take 1.5 tablets every day. You can take it early in the morning or at bedtime  Call anytime if your headache is severe or different.

## 2019-08-06 NOTE — Progress Notes (Signed)
Pre visit review using our clinic review tool, if applicable. No additional management support is needed unless otherwise documented below in the visit note. 

## 2019-08-06 NOTE — Progress Notes (Signed)
Subjective:    Patient ID: Carl Beltran, male    DOB: [birth year], 43 y.o.   MRN: [id number]  DOS:  08/06/2019 Type of visit - description: CPX ADD well controlled Anxiety, some depression: Restarted Lexapro a few months ago, takes it at night, would like to change because it is not controlling his symptoms completely and as soon as he takes Lexapro he gets really tired and sleepy. No suicidal ideas. Stress related to multiple issues including work, family, quarantine, etc.  Also complaining of headaches for few weeks, mild, persistent, located at the top of the head.  No associated nausea, vomiting, dizziness, slurred speech, diplopia, neck pain or other neurological symptoms.  Admits to some snoring but only when he is extremely tired. Denies feeling sleepy per se, he feels fatigue taking Lexapro.  Wt Readings from Last 3 Encounters:  08/06/19 211 lb 8 oz (95.9 kg)  10/28/18 214 lb (97.1 kg)  06/04/18 220 lb 4 oz (99.9 kg)     Review of Systems  Other than above, a 14 point review of systems is negative    Past Medical History:  Diagnosis Date  . Adult ADHD 2017   [place], predominantly inattentive presentation  . Attention deficit hyperactivity disorder (ADHD) 04/24/2016  . Depressive personality disorder 2017   [place]  . Erectile dysfunction     Past Surgical History:  Procedure Laterality Date  . HERNIA REPAIR     umbilical, at age 23 y/o    Social History   Socioeconomic History  . Marital status: Married    Spouse name: Not on file  . Number of children: 2  . Years of education: Not on file  . Highest education level: Not on file  Occupational History  . Occupation: IT    Employer: [employer]  Social Needs  . Financial resource strain: Not on file  . Food insecurity    Worry: Not on file    Inability: Not on file  . Transportation needs    Medical: Not on file    Non-medical: Not on file  Tobacco Use  .  Smoking status: Never Smoker  . Smokeless tobacco: Never Used  Substance and Sexual Activity  . Alcohol use: Yes    Comment: socially   . Drug use: No  . Sexual activity: Not on file  Lifestyle  . Physical activity    Days per week: Not on file    Minutes per session: Not on file  . Stress: Not on file  Relationships  . Social Herbalist on phone: Not on file    Gets together: Not on file    Attends religious service: Not on file    Active member of club or organization: Not on file    Attends meetings of clubs or organizations: Not on file    Relationship status: Not on file  . Intimate partner violence    Fear of current or ex partner: Not on file    Emotionally abused: Not on file    Physically abused: Not on file    Forced sexual activity: Not on file  Other Topics Concern  . Not on file  Social History Narrative   Household-- pt , wife ,2009, 2006     Family History  Problem Relation Age of Onset  . CAD Other        GF  . Tongue cancer Father   . Colon cancer Neg Hx   .  Prostate cancer Neg Hx   . Diabetes Neg Hx      Allergies as of 08/06/2019   No Known Allergies     Medication List       Accurate as of August 06, 2019  1:13 PM. If you have any questions, ask your nurse or doctor.        amphetamine-dextroamphetamine 10 MG tablet Commonly known as: Adderall Take 2 tablets by mouth in AM, and 1 tablet by mouth in PM as needed.   escitalopram 10 MG tablet Commonly known as: Lexapro Take 1 tablet (10 mg total) by mouth daily.   sildenafil 20 MG tablet Commonly known as: REVATIO TAKE 3 -4 TABLETS BY MOUTH AT BEDTIME AS NEEDED.           Objective:   Physical Exam BP 122/75 (BP Location: Left Arm, Patient Position: Sitting, Cuff Size: Normal)   Pulse 98   Temp (!) 97.5 F (36.4 C) (Temporal)   Resp 16   Ht 5\' 5"  (1.651 m)   Wt 211 lb 8 oz (95.9 kg)   SpO2 100%   BMI 35.20 kg/m   General: Well developed, NAD, BMI  noted Neck: No  thyromegaly  HEENT:  Normocephalic . Face symmetric, atraumatic Lungs:  CTA B Normal respiratory effort, no intercostal retractions, no accessory muscle use. Heart: RRR,  no murmur.  No pretibial edema bilaterally  Abdomen:  Not distended, soft, non-tender. No rebound or rigidity.   Skin: Exposed areas without rash. Not pale. Not jaundice Neurologic:  alert & oriented X3.  Speech normal, gait appropriate for age and unassisted Strength symmetric and appropriate for age. EOMI, pupils equal and reactive, DTR symmetric. Psych: Cognition and judgment appear intact.  Cooperative with normal attention span and concentration.  Behavior appropriate. No anxious or depressed appearing.     Assessment     Assessment ADHD (confirmed by psychology 03-2016) Depression (dx by psych 03-2016) ED   PLAN: ADHD: Currently well controlled, will call for refills as needed, would like to start doing 90-day supply which is okay Depression, anxiety: Restarted Lexapro few months ago, would like to change because is not helping as much as he would like to, also made him sleepy and tired. We agreed to switch to fluoxetine 30 mg daily, see AVS.  Reassess in 6 weeks Headaches: Mild and persisting, likely tensional, neurological exam is normal, no red flags.  Reassess in 6 weeks, after we change his anxiety treatment. RTC 6 weeks

## 2019-08-06 NOTE — Assessment & Plan Note (Signed)
ADHD: Currently well controlled, will call for refills as needed, would like to start doing 90-day supply which is okay Depression, anxiety: Restarted Lexapro few months ago, would like to change because is not helping as much as he would like to, also made him sleepy and tired. We agreed to switch to fluoxetine 30 mg daily, see AVS.  Reassess in 6 weeks Headaches: Mild and persisting, likely tensional, neurological exam is normal, no red flags.  Reassess in 6 weeks, after we change his anxiety treatment. RTC 6 weeks

## 2019-08-06 NOTE — Assessment & Plan Note (Signed)
-  Td 2015 - flu shot today -Had a Colonoscopy in 2008 for rectal bleeding, now w/ no sx  -Diet and exercise discussed  -LABS : CMP, FLP, CBC, UDS

## 2019-08-08 LAB — PAIN MGMT, PROFILE 8 W/CONF, U
6 Acetylmorphine: NEGATIVE ng/mL
Alcohol Metabolites: NEGATIVE ng/mL (ref <500)
Amphetamine: 20714 ng/mL
Amphetamines: POSITIVE ng/mL
Benzodiazepines: NEGATIVE ng/mL
Buprenorphine, Urine: NEGATIVE ng/mL
Cocaine Metabolite: NEGATIVE ng/mL
Creatinine: 203.7 mg/dL
MDMA: NEGATIVE ng/mL
Marijuana Metabolite: NEGATIVE ng/mL
Methamphetamine: NEGATIVE ng/mL
Opiates: NEGATIVE ng/mL
Oxidant: NEGATIVE ug/mL
Oxycodone: NEGATIVE ng/mL
pH: 5.3 (ref 4.5–9.0)

## 2019-08-29 ENCOUNTER — Encounter: Payer: Self-pay | Admitting: Physician Assistant

## 2019-08-29 ENCOUNTER — Encounter: Payer: Self-pay | Admitting: Internal Medicine

## 2019-08-29 DIAGNOSIS — Z20822 Contact with and (suspected) exposure to covid-19: Secondary | ICD-10-CM

## 2019-08-29 DIAGNOSIS — Z20828 Contact with and (suspected) exposure to other viral communicable diseases: Secondary | ICD-10-CM

## 2019-08-29 NOTE — Telephone Encounter (Signed)
Spoke w/ Pt- informed of PCP recommendations. Instructed to go to [facility] for testing. Orders placed.

## 2019-09-02 ENCOUNTER — Other Ambulatory Visit: Payer: Self-pay

## 2019-09-02 DIAGNOSIS — R6889 Other general symptoms and signs: Secondary | ICD-10-CM

## 2019-09-04 ENCOUNTER — Encounter: Payer: Self-pay | Admitting: Physician Assistant

## 2019-09-04 ENCOUNTER — Encounter: Payer: Self-pay | Admitting: Internal Medicine

## 2019-09-04 LAB — NOVEL CORONAVIRUS, NAA: SARS-CoV-2, NAA: DETECTED — AB

## 2019-09-09 ENCOUNTER — Other Ambulatory Visit: Payer: Self-pay

## 2019-09-09 DIAGNOSIS — Z20822 Contact with and (suspected) exposure to covid-19: Secondary | ICD-10-CM

## 2019-09-11 ENCOUNTER — Telehealth: Payer: Self-pay | Admitting: *Deleted

## 2019-09-11 LAB — NOVEL CORONAVIRUS, NAA: SARS-CoV-2, NAA: DETECTED — AB

## 2019-09-11 NOTE — Telephone Encounter (Signed)
Pt seen by Dr Larose Kells, [place]; notified Rod Holler that pt tested positive COVID at community testing site, and the test was not ordered by  Dr Larose Kells; she verified understanding.

## 2019-09-11 NOTE — Telephone Encounter (Signed)
Has a virtual appointment with me tomorrow

## 2019-09-12 ENCOUNTER — Other Ambulatory Visit: Payer: Self-pay

## 2019-09-12 ENCOUNTER — Ambulatory Visit (INDEPENDENT_AMBULATORY_CARE_PROVIDER_SITE_OTHER): Payer: BC Managed Care – PPO | Admitting: Internal Medicine

## 2019-09-12 DIAGNOSIS — F900 Attention-deficit hyperactivity disorder, predominantly inattentive type: Secondary | ICD-10-CM | POA: Diagnosis not present

## 2019-09-12 DIAGNOSIS — U071 COVID-19: Secondary | ICD-10-CM | POA: Diagnosis not present

## 2019-09-12 MED ORDER — AMPHETAMINE-DEXTROAMPHETAMINE 10 MG PO TABS: ORAL_TABLET | ORAL | 0 refills | Status: DC

## 2019-09-12 NOTE — Progress Notes (Signed)
Subjective:    Patient ID: Carl Beltran, male    DOB: 24-Feb-1976, 43 y.o.   MRN: [id number]  DOS:  09/12/2019 Type of visit - description: Virtual Visit via Video Note  I connected with@   by a video enabled telemedicine application and verified that I am speaking with the correct person using two identifiers.   THIS ENCOUNTER IS A VIRTUAL VISIT DUE TO COVID-19 - PATIENT WAS NOT SEEN IN THE OFFICE. PATIENT HAS CONSENTED TO VIRTUAL VISIT / TELEMEDICINE VISIT   Location of patient: home  Location of provider: office  I discussed the limitations of evaluation and management by telemedicine and the availability of in person appointments. The patient expressed understanding and agreed to proceed.  History of Present Illness:   Acute visit Due to work, he frequently has to visit the court  house. He was told he was in contact with a Covid positive person, went to be tested on 09/02/2019: Test came back positive. He had no symptoms whatsoever. He was retested 09/09/2019 and he was still positive. He is somewhat concerned because he lost his father recently went to visit his mother yesterday and wonders if that is safe to see her again today or tomorrow. One of his children tested positive, about 3 days ago, the other one tested negative ADD: Needs a refill on Adderall   Review of Systems  Denies fever chills No nausea, vomiting, diarrhea No myalgias No chest pain no difficulty breathing  Past Medical History:  Diagnosis Date  . Adult ADHD 2017   [place], predominantly inattentive presentation  . Attention deficit hyperactivity disorder (ADHD) 04/24/2016  . Depressive personality disorder 2017   [place]  . Erectile dysfunction     Past Surgical History:  Procedure Laterality Date  . HERNIA REPAIR     umbilical, at age 29 y/o    Social History   Socioeconomic History  . Marital status: Married    Spouse name: Not on file  . Number of  children: 2  . Years of education: Not on file  . Highest education level: Not on file  Occupational History  . Occupation: IT    Employer: [employer]  Social Needs  . Financial resource strain: Not on file  . Food insecurity    Worry: Not on file    Inability: Not on file  . Transportation needs    Medical: Not on file    Non-medical: Not on file  Tobacco Use  . Smoking status: Never Smoker  . Smokeless tobacco: Never Used  Substance and Sexual Activity  . Alcohol use: Yes    Comment: socially   . Drug use: No  . Sexual activity: Not on file  Lifestyle  . Physical activity    Days per week: Not on file    Minutes per session: Not on file  . Stress: Not on file  Relationships  . Social Herbalist on phone: Not on file    Gets together: Not on file    Attends religious service: Not on file    Active member of club or organization: Not on file    Attends meetings of clubs or organizations: Not on file    Relationship status: Not on file  . Intimate partner violence    Fear of current or ex partner: Not on file    Emotionally abused: Not on file    Physically abused: Not on file    Forced  sexual activity: Not on file  Other Topics Concern  . Not on file  Social History Narrative   Household-- pt , wife ,2009, 2006      Allergies as of 09/12/2019   No Known Allergies     Medication List       Accurate as of September 12, 2019  9:36 AM. If you have any questions, ask your nurse or doctor.        amphetamine-dextroamphetamine 10 MG tablet Commonly known as: Adderall Take 2 tablets by mouth in AM, and 1 tablet by mouth in PM as needed.   FLUoxetine 20 MG tablet Commonly known as: PROZAC Take 1.5 tablets (30 mg total) by mouth daily.   sildenafil 20 MG tablet Commonly known as: REVATIO TAKE 3 -4 TABLETS BY MOUTH AT BEDTIME AS NEEDED.           Objective:   Physical Exam There were no vitals taken for this visit. This is a virtual video  visit He is alert oriented x3, in no distress    Assessment      Assessment ADHD (confirmed by psychology 03-2016) Depression (dx by psych 03-2016) ED   PLAN: COVID-19 positive: Tested positive for the first time 09/01/2010, retested + 09/09/2019, he has been completely asymptomatic throughout. At this point after 10 days I do not believe he is contagious and  will be safe to see his mother.  For extra precaution, I recommend to use a mask when visit his elderly mother. As far as his child who tested negative, he needs to be separated for his son who tested positive few days ago until the 10-day mark.  Until then if they are in the same room they need to wear masks. He needs a letter stating that he is not contagious anymore. Done ADHD: Refill Adderall   I discussed the assessment and treatment plan with the patient. The patient was provided an opportunity to ask questions and all were answered. The patient agreed with the plan and demonstrated an understanding of the instructions.   The patient was advised to call back or seek an in-person evaluation if the symptoms worsen or if the condition fails to improve as anticipated.

## 2019-09-13 ENCOUNTER — Encounter: Payer: Self-pay | Admitting: Internal Medicine

## 2019-09-13 NOTE — Assessment & Plan Note (Signed)
COVID-19 positive: Tested positive for the first time 09/01/2010, retested + 09/09/2019, he has been completely asymptomatic throughout. At this point after 10 days I do not believe he is contagious and  will be safe to see his mother.  For extra precaution, I recommend to use a mask when visit his elderly mother. As far as his child who tested negative, he needs to be separated for his son who tested positive few days ago until the 10-day mark.  Until then if they are in the same room they need to wear masks. He needs a letter stating that he is not contagious anymore. Done ADHD: Refill Adderall

## 2019-11-01 ENCOUNTER — Other Ambulatory Visit: Payer: Self-pay | Admitting: Internal Medicine

## 2019-11-19 ENCOUNTER — Other Ambulatory Visit: Payer: Self-pay | Admitting: Internal Medicine

## 2019-11-19 ENCOUNTER — Encounter: Payer: Self-pay | Admitting: Internal Medicine

## 2019-11-19 MED ORDER — AMPHETAMINE-DEXTROAMPHETAMINE 10 MG PO TABS: ORAL_TABLET | ORAL | 0 refills | Status: DC

## 2019-11-19 NOTE — Telephone Encounter (Signed)
Requesting: Adderral Contract:  UDS: 08/06/2019 Last OV: 09/12/2019 Next OV: N/A Last Refill: 09/12/2019, #180--0 RF Database:   Please advise

## 2019-11-19 NOTE — Telephone Encounter (Signed)
Sent!

## 2019-11-19 NOTE — Telephone Encounter (Signed)
Last Adderall RX:  09/22/19, #180 Last OV:  09/12/19 Next OV: not scheduled UDS:  08/06/19 CSC: 02/27/18, past due

## 2019-11-19 NOTE — Telephone Encounter (Signed)
Duplicate

## 2019-12-09 ENCOUNTER — Ambulatory Visit: Payer: BC Managed Care – PPO | Attending: Internal Medicine

## 2019-12-09 DIAGNOSIS — Z20822 Contact with and (suspected) exposure to covid-19: Secondary | ICD-10-CM | POA: Diagnosis not present

## 2019-12-10 LAB — NOVEL CORONAVIRUS, NAA: SARS-CoV-2, NAA: NOT DETECTED

## 2019-12-11 ENCOUNTER — Encounter: Payer: Self-pay | Admitting: Internal Medicine

## 2019-12-15 ENCOUNTER — Other Ambulatory Visit: Payer: Self-pay | Admitting: Internal Medicine

## 2019-12-30 ENCOUNTER — Ambulatory Visit: Payer: BC Managed Care – PPO | Admitting: Internal Medicine

## 2019-12-30 ENCOUNTER — Encounter: Payer: Self-pay | Admitting: Internal Medicine

## 2019-12-30 ENCOUNTER — Other Ambulatory Visit: Payer: Self-pay

## 2019-12-30 VITALS — BP 125/80 | HR 93 | Temp 97.2°F | Resp 12 | Ht 70.0 in | Wt 215.4 lb

## 2019-12-30 DIAGNOSIS — R079 Chest pain, unspecified: Secondary | ICD-10-CM

## 2019-12-30 DIAGNOSIS — G4452 New daily persistent headache (NDPH): Secondary | ICD-10-CM

## 2019-12-30 DIAGNOSIS — Z09 Encounter for follow-up examination after completed treatment for conditions other than malignant neoplasm: Secondary | ICD-10-CM | POA: Diagnosis not present

## 2019-12-30 DIAGNOSIS — F329 Major depressive disorder, single episode, unspecified: Secondary | ICD-10-CM

## 2019-12-30 DIAGNOSIS — F32A Depression, unspecified: Secondary | ICD-10-CM

## 2019-12-30 MED ORDER — BUPROPION HCL ER (SR) 150 MG PO TB12: 150.0000 mg | ORAL_TABLET | Freq: Two times a day (BID) | ORAL | 0 refills | Status: DC

## 2019-12-30 NOTE — Patient Instructions (Addendum)
We will call you and schedule a follow-up in 4 weeks  We will schedule your CAT scan of the head and blood work  Bupropion 150 mg: 1 tablet daily  Decrease fluoxetine 20 mg to: 1 tablet every day for 1 week Then half tablet every day for 1 week Then stop  ER if severe or unusual headache, or if the chest pain returns  See a counselor

## 2019-12-30 NOTE — Progress Notes (Signed)
Subjective:    Patient ID: Carl Beltran, male    DOB: 17-Jul-1976, 44 y.o.   MRN: [id number]  DOS:  12/30/2019 Type of visit - description: Acute Multiple symptoms 1 month history of headache, it is described as nagging, daily HA, at different places (sometimes behind one of his eyes, sometimes the top of the head other times the back of the head). Has used Tylenol without much help. It is not the worst headache of his life. No associated nausea, phonophobia, question of photophobia.  Also, 4 weeks ago had 2 episodes of chest pain: Each lasted 2-hour, was in the middle of the chest without radiation, not exertional. No associated shortness of breath, nausea, diaphoresis.  No further episodes.  Depression: Currently on Prozac, he feels he is more depressed lately. Number of things are affecting him including the loss of his father few months ago. He thought that depression was possibly a side effect of Prozac and try to wean off for the last few days.  Also is causing side effects.  Review of Systems Denies fever chills No heartburn No cough or wheezing No suicidal ideas  Past Medical History:  Diagnosis Date  . Adult ADHD 2017   [place], predominantly inattentive presentation  . Attention deficit hyperactivity disorder (ADHD) 04/24/2016  . Depressive personality disorder 2017   [place]  . Erectile dysfunction     Past Surgical History:  Procedure Laterality Date  . HERNIA REPAIR     umbilical, at age 65 y/o    Allergies as of 12/30/2019   No Known Allergies     Medication List       Accurate as of December 30, 2019 11:59 PM. If you have any questions, ask your nurse or doctor.        amphetamine-dextroamphetamine 10 MG tablet Commonly known as: Adderall Take 2 tablets by mouth in AM, and 1 tablet by mouth in PM as needed.   buPROPion 150 MG 12 hr tablet Commonly known as: Wellbutrin SR Take 1 tablet (150 mg total) by mouth 2  (two) times daily. Started by: Kathlene November, MD   FLUoxetine 20 MG tablet Commonly known as: PROZAC TAKE 1 AND 1/2 TABLETS(30 MG) BY MOUTH DAILY   sildenafil 20 MG tablet Commonly known as: REVATIO TAKE 3 TO 4 TABLETS BY MOUTH AT BEDTIME AS NEEDED             Objective:   Physical Exam BP 125/80 (BP Location: Right Arm, Cuff Size: Large)   Pulse 93   Temp (!) 97.2 F (36.2 C) (Temporal)   Resp 12   Ht 5\' 10"  (1.778 m)   Wt 215 lb 6.4 oz (97.7 kg)   SpO2 99%   BMI 30.91 kg/m  General:   Well developed, NAD, BMI noted. HEENT:  Normocephalic . Face symmetric, atraumatic Neck: No TTP, range of motion normal.  No stiffness. Lungs:  CTA B Normal respiratory effort, no intercostal retractions, no accessory muscle use. Heart: RRR,  no murmur.  Lower extremities: no pretibial edema bilaterally  Skin: Not pale. Not jaundice Neurologic:  alert & oriented X3.  Speech normal, gait appropriate for age and unassisted. EOMI.  Pupils equal and reactive.  Motor symmetric, DTRs symmetric. Psych--  Cognition and judgment appear intact.  Cooperative with normal attention span and concentration.  Behavior appropriate. No anxious or depressed appearing.      Assessment      Assessment ADHD (confirmed by psychology 03-2016) Depression (  dx by psych [phone]) ED   PLAN: Headache: As described above.  Neurological exam is nonfocal, probably tensional headache but other etiologies are possible. Plan: CT head, sed rate, BMP, CBC.  If work-up negative treat as tension headache. Chest pain: 2 episodes, a month ago.  No further problems.  Pain was atypical (no associated symptoms, at rest). No first-degree relative with CAD, not a smoker, CV RF 10 years 1.8%. EKG today NSR. We talk about observation versus further eval, we elected observation and reassess in 4 weeks. Depression: Currently on Prozac, has been feeling more depressed lately.  A number of things are working against   him  including the loss of his father in November.  Also Prozac is causing sexual side effects. He already started to wean off Prozac.  I think he needs treatment, I offered Wellbutrin and he agreed. Plan: Continue weaning off Prozac, see AVS.  Start Wellbutrin Strongly recommend to seek counseling Plan: Will arrange for a CT, labs. Reassess in 4 weeks ER if severe problems.  Time spent: 42 minutes.  Assessing 2 new problems (headache, chest pain) and 1 worsening issue.  Also provided listening therapy regards depression and talking about alternative treatments.  This visit occurred during the SARS-CoV-2 public health emergency.  Safety protocols were in place, including screening questions prior to the visit, additional usage of staff PPE, and extensive cleaning of exam room while observing appropriate contact time as indicated for disinfecting solutions.

## 2019-12-31 NOTE — Assessment & Plan Note (Signed)
Headache: As described above.  Neurological exam is nonfocal, probably tensional headache but other etiologies are possible. Plan: CT head, sed rate, BMP, CBC.  If work-up negative treat as tension headache. Chest pain: 2 episodes, a month ago.  No further problems.  Pain was atypical (no associated symptoms, at rest). No first-degree relative with CAD, not a smoker, CV RF 10 years 1.8%. EKG today NSR. We talk about observation versus further eval, we elected observation and reassess in 4 weeks. Depression: Currently on Prozac, has been feeling more depressed lately.  A number of things are working against   him including the loss of his father in November.  Also Prozac is causing sexual side effects. He already started to wean off Prozac.  I think he needs treatment, I offered Wellbutrin and he agreed. Plan: Continue weaning off Prozac, see AVS.  Start Wellbutrin Strongly recommend to seek counseling Plan: Will arrange for a CT, labs. Reassess in 4 weeks ER if severe problems.

## 2020-01-01 ENCOUNTER — Ambulatory Visit (HOSPITAL_BASED_OUTPATIENT_CLINIC_OR_DEPARTMENT_OTHER)
Admission: RE | Admit: 2020-01-01 | Discharge: 2020-01-01 | Disposition: A | Discharge status: Home / Self Care | Payer: BC Managed Care – PPO | Source: Ambulatory Visit | Attending: Internal Medicine | Admitting: Internal Medicine

## 2020-01-01 ENCOUNTER — Other Ambulatory Visit: Payer: Self-pay

## 2020-01-01 DIAGNOSIS — G4452 New daily persistent headache (NDPH): Secondary | ICD-10-CM | POA: Insufficient documentation

## 2020-01-01 DIAGNOSIS — R519 Headache, unspecified: Secondary | ICD-10-CM | POA: Diagnosis not present

## 2020-01-02 ENCOUNTER — Other Ambulatory Visit: Payer: BC Managed Care – PPO

## 2020-01-06 ENCOUNTER — Other Ambulatory Visit (INDEPENDENT_AMBULATORY_CARE_PROVIDER_SITE_OTHER): Payer: BC Managed Care – PPO

## 2020-01-06 ENCOUNTER — Other Ambulatory Visit: Payer: Self-pay

## 2020-01-06 ENCOUNTER — Other Ambulatory Visit: Payer: BC Managed Care – PPO

## 2020-01-06 DIAGNOSIS — G4452 New daily persistent headache (NDPH): Secondary | ICD-10-CM | POA: Diagnosis not present

## 2020-01-06 LAB — CBC WITH DIFFERENTIAL/PLATELET
Basophils Absolute: 0 10*3/uL (ref 0.0–0.1)
Basophils Relative: 0.7 % (ref 0.0–3.0)
Eosinophils Absolute: 0.1 10*3/uL (ref 0.0–0.7)
Eosinophils Relative: 1.4 % (ref 0.0–5.0)
HCT: 41.3 % (ref 39.0–52.0)
Hemoglobin: 14.1 g/dL (ref 13.0–17.0)
Lymphocytes Relative: 29.6 % (ref 12.0–46.0)
Lymphs Abs: 1.7 10*3/uL (ref 0.7–4.0)
MCHC: 34.1 g/dL (ref 30.0–36.0)
MCV: 88.7 fl (ref 78.0–100.0)
Monocytes Absolute: 0.5 10*3/uL (ref 0.1–1.0)
Monocytes Relative: 7.7 % (ref 3.0–12.0)
Neutro Abs: 3.6 10*3/uL (ref 1.4–7.7)
Neutrophils Relative %: 60.6 % (ref 43.0–77.0)
Platelets: 340 10*3/uL (ref 150.0–400.0)
RBC: 4.66 Mil/uL (ref 4.22–5.81)
RDW: 13 % (ref 11.5–15.5)
WBC: 5.9 10*3/uL (ref 4.0–10.5)

## 2020-01-06 LAB — BASIC METABOLIC PANEL
BUN: 13 mg/dL (ref 6–23)
CO2: 28 mEq/L (ref 19–32)
Calcium: 9.6 mg/dL (ref 8.4–10.5)
Chloride: 104 mEq/L (ref 96–112)
Creatinine, Ser: 1.04 mg/dL (ref 0.40–1.50)
GFR: 77.72 mL/min (ref >60.00)
Glucose, Bld: 104 mg/dL — ABNORMAL HIGH (ref 70–99)
Potassium: 4.6 mEq/L (ref 3.5–5.1)
Sodium: 140 mEq/L (ref 135–145)

## 2020-01-06 LAB — SEDIMENTATION RATE: Sed Rate: 5 mm/hr (ref 0–15)

## 2020-01-09 ENCOUNTER — Telehealth: Payer: Self-pay | Admitting: Internal Medicine

## 2020-01-09 ENCOUNTER — Encounter: Payer: Self-pay | Admitting: Internal Medicine

## 2020-01-09 MED ORDER — MELOXICAM 15 MG PO TABS: 15.0000 mg | ORAL_TABLET | Freq: Every day | ORAL | 0 refills | Status: DC | PRN

## 2020-01-09 MED ORDER — AMPHETAMINE-DEXTROAMPHETAMINE 10 MG PO TABS: ORAL_TABLET | ORAL | 0 refills | Status: DC

## 2020-01-09 MED ORDER — CYCLOBENZAPRINE HCL 10 MG PO TABS: 10.0000 mg | ORAL_TABLET | Freq: Two times a day (BID) | ORAL | 0 refills | Status: DC | PRN

## 2020-01-09 NOTE — Addendum Note (Signed)
Addended byDamita Dunnings D on: 01/09/2020 01:08 PM   Modules accepted: Orders

## 2020-01-09 NOTE — Telephone Encounter (Signed)
Sent!

## 2020-01-09 NOTE — Telephone Encounter (Signed)
Adderall refill.   Last OV: 12/30/2019 Last Fill: 11/19/2019 #180 and 0RF Pt sig: 2 tabs in AM and 1 in PM prn UDS: 08/06/2019 Low risk

## 2020-01-15 ENCOUNTER — Ambulatory Visit: Payer: BC Managed Care – PPO | Attending: Internal Medicine

## 2020-01-15 DIAGNOSIS — Z23 Encounter for immunization: Secondary | ICD-10-CM

## 2020-01-15 NOTE — Progress Notes (Signed)
   Covid-19 Vaccination Clinic  Name:  Carl Beltran    MRN: [id number] DOB: Mar 11, 1976  01/15/2020  [name] was observed post Covid-19 immunization for 15 minutes without incident. He was provided with Vaccine Information Sheet and instruction to access the V-Safe system.   [name] was instructed to call 911 with any severe reactions post vaccine: Marland Kitchen Difficulty breathing  . Swelling of face and throat  . A fast heartbeat  . A bad rash all over body  . Dizziness and weakness   Immunizations Administered    Name Date Dose VIS Date Route   Pfizer COVID-19 Vaccine 01/15/2020 11:54 AM 0.3 mL 10/17/2019 Intramuscular   Manufacturer: [place]   Lot: [id number]   [place]: [id number]

## 2020-01-27 ENCOUNTER — Other Ambulatory Visit: Payer: Self-pay

## 2020-01-27 ENCOUNTER — Encounter: Payer: Self-pay | Admitting: Internal Medicine

## 2020-01-27 ENCOUNTER — Ambulatory Visit: Payer: BC Managed Care – PPO | Admitting: Internal Medicine

## 2020-01-27 VITALS — BP 113/77 | HR 99 | Temp 96.6°F | Resp 18 | Ht 70.0 in | Wt 219.1 lb

## 2020-01-27 DIAGNOSIS — G4452 New daily persistent headache (NDPH): Secondary | ICD-10-CM

## 2020-01-27 DIAGNOSIS — F329 Major depressive disorder, single episode, unspecified: Secondary | ICD-10-CM

## 2020-01-27 DIAGNOSIS — R079 Chest pain, unspecified: Secondary | ICD-10-CM | POA: Diagnosis not present

## 2020-01-27 DIAGNOSIS — F32A Depression, unspecified: Secondary | ICD-10-CM

## 2020-01-27 MED ORDER — BUPROPION HCL ER (SR) 150 MG PO TB12: 150.0000 mg | ORAL_TABLET | Freq: Two times a day (BID) | ORAL | 4 refills | Status: DC

## 2020-01-27 MED ORDER — FLUOXETINE HCL 20 MG PO TABS: 20.0000 mg | ORAL_TABLET | Freq: Every day | ORAL | 4 refills | Status: DC

## 2020-01-27 NOTE — Patient Instructions (Addendum)
Continue bupropion twice a day as before  Restart fluoxetine 20 mg: Half tablet daily for 1 week, then a full tablet daily  For headaches: You can try the muscle relaxant cyclobenzaprine up to twice a day as needed for headache  You can also try meloxicam once daily as needed for headache.  You can take 1 or the other but you can also take it in the same day.  See what works for you.  Meloxicam: Always take it with food because it may cause gastritis.    GO TO THE FRONT DESK, please reschedule your appointments Come back for   a checkup in 6 weeks

## 2020-01-27 NOTE — Progress Notes (Signed)
Subjective:    Patient ID: Carl Beltran, male    DOB: 04/17/1976, 44 y.o.   MRN: [id number]  DOS:  01/27/2020 Type of visit - description: Follow-up Today we talk about headache, chest pain, depression. Headache is about the same, tried Flexeril once, make him sleepy; he is not sure about the response he had to meloxicam. Denies neck stiffness.  Depression: Started Wellbutrin, no major improvement or different.  Sexual side effects have definitely decreased.  Chest pain: He still has occasional mild mid chest, left pectoral pain, sharp, no radiation, not exertional and in fact is typically at rest.   Review of Systems See above   Past Medical History:  Diagnosis Date  . Adult ADHD 2017   [place], predominantly inattentive presentation  . Attention deficit hyperactivity disorder (ADHD) 04/24/2016  . Depressive personality disorder 2017   [place]  . Erectile dysfunction     Past Surgical History:  Procedure Laterality Date  . HERNIA REPAIR     umbilical, at age 22 y/o    Allergies as of 01/27/2020   No Known Allergies     Medication List       Accurate as of January 27, 2020  4:47 PM. If you have any questions, ask your nurse or doctor.        amphetamine-dextroamphetamine 10 MG tablet Commonly known as: Adderall Take 2 tablets by mouth in AM, and 1 tablet by mouth in PM as needed.   buPROPion 150 MG 12 hr tablet Commonly known as: Wellbutrin SR Take 1 tablet (150 mg total) by mouth 2 (two) times daily.   cyclobenzaprine 10 MG tablet Commonly known as: FLEXERIL Take 1 tablet (10 mg total) by mouth 2 (two) times daily as needed for muscle spasms.   FLUoxetine 20 MG tablet Commonly known as: PROZAC TAKE 1 AND 1/2 TABLETS(30 MG) BY MOUTH DAILY   meloxicam 15 MG tablet Commonly known as: MOBIC Take 1 tablet (15 mg total) by mouth daily as needed for pain.   sildenafil 20 MG tablet Commonly known as: REVATIO TAKE 3 TO 4  TABLETS BY MOUTH AT BEDTIME AS NEEDED          Objective:   Physical Exam BP 113/77 (BP Location: Left Arm, Patient Position: Sitting, Cuff Size: Normal)   Pulse 99   Temp (!) 96.6 F (35.9 C) (Temporal)   Resp 18   Ht 5\' 10"  (1.778 m)   Wt 219 lb 2 oz (99.4 kg)   SpO2 98%   BMI 31.44 kg/m   General:   Well developed, NAD, BMI noted. HEENT:  Normocephalic . Face symmetric, atraumatic Neck: No TTP of the cervical spine, range of motion normal Lungs:  CTA B Normal respiratory effort, no intercostal retractions, no accessory muscle use. Heart: RRR,  no murmur.  Lower extremities: no pretibial edema bilaterally  Skin: Not pale. Not jaundice Neurologic:  alert & oriented X3.  Speech normal, gait appropriate for age and unassisted Psych--  Cognition and judgment appear intact.  Cooperative with normal attention span and concentration.  Behavior appropriate. Although his PHQ-9 is high with a, he seems more engaged less depressed prior to the last visit.     Assessment       Assessment ADHD (confirmed by psychology 03-2016) Depression (dx by psych 03-2016) ED   PLAN: Depression: Since the last office visit, started Wellbutrin, feeling about the same, still searching for a counselor. 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 We talk about options and at the end we agreed to continue with Wellbutrin and reintroduce fluoxetine because it  was very effective in controlling depression but caused sexual s/e;  hopefully s/e will be canceled out by Wellbutrin and using sildenafil. Headache: Since the last office visit, CT and sed rate were unremarkable, he reach out for further treatment, I prescribed Flexeril meloxicam.  Headaches are about the same. Plan: Neuro referral, we reviewed together how to use Flexeril and meloxicam see AVS. Chest pain: Still having symptoms but  they are much less noticeable, again there are quite atypical, observation for now. RTC 6 weeks   This visit occurred during the SARS-CoV-2 public health emergency.  Safety protocols were in place, including screening questions prior to the visit, additional usage of staff PPE, and extensive cleaning of exam room while observing appropriate contact time as indicated for disinfecting solutions.

## 2020-01-27 NOTE — Progress Notes (Signed)
Pre visit review using our clinic review tool, if applicable. No additional management support is needed unless otherwise documented below in the visit note. 

## 2020-01-28 NOTE — Assessment & Plan Note (Signed)
Depression: Since the last office visit, started Wellbutrin, feeling about the same, still searching for a counselor. 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 We talk about options and at the end we agreed to continue with Wellbutrin and reintroduce fluoxetine because it  was very effective in controlling depression but caused sexual s/e;  hopefully s/e will be canceled out by Wellbutrin and using sildenafil. Headache: Since the last office visit, CT and sed rate were unremarkable, he reach out for further treatment, I prescribed Flexeril meloxicam.  Headaches are about the same. Plan: Neuro referral, we reviewed together how to use Flexeril and meloxicam see AVS. Chest pain: Still having symptoms but they are much less noticeable, again there are quite atypical, observation for now. RTC 6 weeks

## 2020-02-09 ENCOUNTER — Ambulatory Visit: Payer: BC Managed Care – PPO | Attending: Internal Medicine

## 2020-02-09 DIAGNOSIS — Z23 Encounter for immunization: Secondary | ICD-10-CM

## 2020-02-09 NOTE — Progress Notes (Signed)
   Covid-19 Vaccination Clinic  Name:  Carl Beltran    MRN: [id number] DOB: 01-07-1976  02/09/2020  [name] was observed post Covid-19 immunization for 15 minutes without incident. He was provided with Vaccine Information Sheet and instruction to access the V-Safe system.   [name] was instructed to call 911 with any severe reactions post vaccine: Marland Kitchen Difficulty breathing  . Swelling of face and throat  . A fast heartbeat  . A bad rash all over body  . Dizziness and weakness   Immunizations Administered    Name Date Dose VIS Date Route   Pfizer COVID-19 Vaccine 02/09/2020  9:05 AM 0.3 mL 10/17/2019 Intramuscular   Manufacturer: [place]   Lot: [id number]   [place]: [id number]

## 2020-02-12 ENCOUNTER — Other Ambulatory Visit: Payer: Self-pay | Admitting: Internal Medicine

## 2020-03-01 NOTE — Progress Notes (Signed)
[facility]    Provider:  Dr Jaynee Eagles Requesting Provider: Colon Branch, MD Primary Care Provider:  Colon Branch, MD  CC:  headache  HPI:  Carl Beltran is a 44 y.o. male here as requested by Colon Branch, MD for new daily persistent headaches. PMHx ADHD, depression, ED, 1-2 migraines a year. I reviewed Dr. Ethel Rana notes, patient was seen for headache started complaining of headache approximately February of this year, stated the headache started about a month prior, nagging, daily, at different places sometimes behind one of his eyes, sometimes the top of the head other times the back of the head, tried using Tylenol, no associated nausea, phonophobia, question of photophobia also at the same time started having chest pain and worsening depression, neurologic exam was nonfocal, diagnosed with probable tension type headaches, CT of the head, sed rate, BMP and CBC were ordered.  Feeling more depressed lately, loss of his father in November, Prozac causing sexual side effects, he was recently changed to Wellbutrin.  CT and sed rate were unremarkable, Flexeril and meloxicam were tried, he was also complaining of chest pain which was atypical.  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  Headaches started in February and they have been stable. It can be anywhere in the head. Last night it was like a migraine, a dull pain, stays localized today it is on the left parietal area. No hx of migraines. No significant hx of migraines in the family. Daily headaches, he wakes up with the headache and he is not sleeping well, in the setting of stress, he snores, he doesn't sleep well but he declines sleep eval. When the headaches is severe it can be migrainous and he does a hx of episodic migraines, when they are  migraines like last night he can sound sensitivity, some light sensitivity, nausea, no vomiting. He had a few concussions in the past. No aura prior to the migraines or headaches. Headaches are daily, all day, he has tried tylenol, caffeine hasn't helped. Migraines can last at least 4 hours, sleep helps. No associated dizziness, tingling. Mobic does not help. Flexeril makes him tired. 3-4 in pain on average, last night was 8/10. He has chronic neck pain and shoulder pain. No other focal neurologic deficits, associated symptoms, inciting events or modifiable factors.   Reviewed notes, labs and imaging from outside physicians, which showed:   CT of the head January 01, 2020 showed No acute intracranial abnormalities including mass lesion or mass effect, hydrocephalus, extra-axial fluid collection, midline shift, hemorrhage, or acute infarction, large ischemic events (personally reviewed images)     Review of Systems: Patient complains of symptoms per HPI as well as the following symptoms: stress, depression. Pertinent negatives and positives per HPI. All others negative.   Social History   Socioeconomic History  . Marital status: Married    Spouse name: Not on file  . Number of children: 2  . Years of education: Not on file  . Highest education level: Bachelor's degree (e.g., BA, AB, BS)  Occupational History  . Occupation: IT    Employer: [employer]  Tobacco Use  . Smoking status: Former Smoker    Years: 3.00    Types: Cigarettes, Cigars  . Smokeless tobacco: Never Used  . Tobacco comment:  smoked while in college   Substance and Sexual Activity  . Alcohol use: Yes    Comment: socially   . Drug use: No  . Sexual activity: Not on file  Other Topics Concern  . Not on file  Social History Narrative   Household-- pt , wife ,2009, 2006      Lives at home with wife & children   Right handed   Caffeine: 1 cup/day   Social Determinants of Health   Financial Resource Strain:    . Difficulty of Paying Living Expenses:   Food Insecurity:   . Worried About Charity fundraiser in the Last Year:   . Arboriculturist in the Last Year:   Transportation Needs:   . Film/video editor (Medical):   Marland Kitchen Lack of Transportation (Non-Medical):   Physical Activity:   . Days of Exercise per Week:   . Minutes of Exercise per Session:   Stress:   . Feeling of Stress :   Social Connections:   . Frequency of Communication with Friends and Family:   . Frequency of Social Gatherings with Friends and Family:   . Attends Religious Services:   . Active Member of Clubs or Organizations:   . Attends Archivist Meetings:   Marland Kitchen Marital Status:   Intimate Partner Violence:   . Fear of Current or Ex-Partner:   . Emotionally Abused:   Marland Kitchen Physically Abused:   . Sexually Abused:     Family History  Problem Relation Age of Onset  . CAD Other        GF  . Tongue cancer Father   . Headache Brother   . Colon cancer Neg Hx   . Prostate cancer Neg Hx   . Diabetes Neg Hx     Past Medical History:  Diagnosis Date  . Adult ADHD 2017   [place], predominantly inattentive presentation  . Allergies    cats, hay  . Attention deficit hyperactivity disorder (ADHD) 04/24/2016  . Depressive personality disorder 2017   [place]  . Erectile dysfunction     Patient Active Problem List   Diagnosis Date Noted  . Chronic daily headache 03/02/2020  . Migraine without aura and without status migrainosus, not intractable 03/02/2020  . Chronic tension-type headache, intractable 03/02/2020  . Depression 10/02/2017  . Attention deficit hyperactivity disorder (ADHD) 04/24/2016  . PCP NOTES >>>>>>> 10/27/2015  . Annual physical exam 10/28/2014  . Wheezing 08/26/2014  . Erectile dysfunction 07/28/2013  . HEMORRHOIDS-INTERNAL 12/27/2009    Past Surgical History:  Procedure Laterality Date  . HERNIA REPAIR     umbilical, at age 28 y/o  . KNEE CARTILAGE  SURGERY Bilateral 09/20/2018    Current Outpatient Medications  Medication Sig Dispense Refill  . amphetamine-dextroamphetamine (ADDERALL) 10 MG tablet Take 2 tablets by mouth in AM, and 1 tablet by mouth in PM as needed. 180 tablet 0  . buPROPion (WELLBUTRIN SR) 150 MG 12 hr tablet Take 1 tablet (150 mg total) by mouth 2 (two) times daily. 60 tablet 4  . cyclobenzaprine (FLEXERIL) 10 MG tablet Take 1 tablet (10 mg total) by mouth 2 (two) times daily as needed for muscle spasms. 60 tablet 0  . meloxicam (MOBIC) 15 MG tablet Take 1 tablet (15 mg total) by mouth daily as needed for pain. 90 tablet 0  . methylPREDNISolone (MEDROL DOSEPAK) 4 MG TBPK tablet Take all pills in the morning with food for 6 days. 6-5-4-3-2-1 21  tablet 0  . nortriptyline (PAMELOR) 25 MG capsule Take 1 capsule (25 mg total) by mouth at bedtime. 30 capsule 3  . rizatriptan (MAXALT-MLT) 10 MG disintegrating tablet Take 1 tablet (10 mg total) by mouth as needed. May repeat once in 2 hours if needed. For headache or migraine. 9 tablet 11  . sildenafil (REVATIO) 20 MG tablet TAKE 3 TO 4 TABLETS BY MOUTH AT BEDTIME AS NEEDED 60 tablet 4   No current facility-administered medications for this visit.    Allergies as of 03/02/2020  . (No Known Allergies)    Vitals: BP 128/81 (BP Location: Right Arm, Patient Position: Sitting)   Pulse 100   Temp (!) 97.1 F (36.2 C) Comment: taken at front  Ht 5\' 11"  (1.803 m)   Wt 215 lb (97.5 kg)   BMI 29.99 kg/m  Last Weight:  Wt Readings from Last 1 Encounters:  03/02/20 215 lb (97.5 kg)   Last Height:   Ht Readings from Last 1 Encounters:  03/02/20 5\' 11"  (1.803 m)     Physical exam: Exam: Gen: NAD, conversant, well nourised, overweight, well groomed                     CV: RRR, no MRG. No Carotid Bruits. No peripheral edema, warm, nontender Eyes: Conjunctivae clear without exudates or hemorrhage  Neuro: Detailed Neurologic Exam  Speech:    Speech is normal; fluent  and spontaneous with normal comprehension.  Cognition:    The patient is oriented to person, place, and time;     recent and remote memory intact;     language fluent;     normal attention, concentration,     fund of knowledge Cranial Nerves:    The pupils are equal, round, and reactive to light. The fundi are flat. Visual fields are full to finger confrontation. Extraocular movements are intact. Trigeminal sensation is intact and the muscles of mastication are normal. The face is symmetric. The palate elevates in the midline. Hearing intact. Voice is normal. Shoulder shrug is normal. The tongue has normal motion without fasciculations.   Coordination:    No ataxia or dysmetria  Gait:    Heel-toe and tandem gait are normal.   Motor Observation:    No asymmetry, no atrophy, and no involuntary movements noted. Tone:    Normal muscle tone.    Posture:    Posture is normal. normal erect    Strength:    Strength is V/V in the upper and lower limbs.      Sensation: intact to LT     Reflex Exam:  DTR's:    Deep tendon reflexes in the upper and lower extremities are brisk bilaterally.   Toes:    The toes are downgoing bilaterally.   Clonus:    Clonus is absent (1-2 beats at AJs can be within normal limits in younger healthy patients)    Assessment/Plan: 44 year old patient with a past medical history depression, erectile dysfunction, ADHD, stress and anxiety, episodic migraines 91-2 times a year) here for headache, resembles tension type daily and started in January in the setting of worsening depression and increased amounts of stress at home and at work.  Dr. Larose Kells has been trying to work with him on medications for his stress and depression and he has been looking for a Social worker.  CT of the head was negative.  -Appears to be tension type headache that started in the setting of worsening depression and increased amounts of  stress at home and at work.  I do think that the depression  and other factors are highly contributory to the headache. He also has episodic migraines.  - Start on Nortriptyline at bedtime. Medrol dosepak in the meantime. Also start Maxalt acutely.   - Recommended sleep study, he declines:  Daily headaches, he wakes up with the headache and he is not sleeping well, in the setting of stress, he snores, he doesn't sleep well, overweight and large neck, but he declines sleep eval.  - f/u 8-12 weeks with amy  -Dr. Larose Kells has tried changing his medications, trying to find a counselor, tried Flexeril, meloxicam, prozac,  and Tylenol without relief.  Appears as though in the past he has tried Lexapro, prednisone at one time.   Orders Placed This Encounter  Procedures  . TSH   Meds ordered this encounter  Medications  . nortriptyline (PAMELOR) 25 MG capsule    Sig: Take 1 capsule (25 mg total) by mouth at bedtime.    Dispense:  30 capsule    Refill:  3  . methylPREDNISolone (MEDROL DOSEPAK) 4 MG TBPK tablet    Sig: Take all pills in the morning with food for 6 days. 6-5-4-3-2-1    Dispense:  21 tablet    Refill:  0  . rizatriptan (MAXALT-MLT) 10 MG disintegrating tablet    Sig: Take 1 tablet (10 mg total) by mouth as needed. May repeat once in 2 hours if needed. For headache or migraine.    Dispense:  9 tablet    Refill:  11    Cc: Colon Branch, MD,    Sarina Ill, MD  [facility] [address] [place], [place] [address]  Phone [phone] Fax [phone]

## 2020-03-02 ENCOUNTER — Other Ambulatory Visit: Payer: Self-pay

## 2020-03-02 ENCOUNTER — Ambulatory Visit: Payer: BC Managed Care – PPO | Admitting: Neurology

## 2020-03-02 ENCOUNTER — Encounter: Payer: Self-pay | Admitting: Neurology

## 2020-03-02 VITALS — BP 128/81 | HR 100 | Temp 97.1°F | Ht 71.0 in | Wt 215.0 lb

## 2020-03-02 DIAGNOSIS — R519 Headache, unspecified: Secondary | ICD-10-CM

## 2020-03-02 DIAGNOSIS — R52 Pain, unspecified: Secondary | ICD-10-CM | POA: Diagnosis not present

## 2020-03-02 DIAGNOSIS — G43009 Migraine without aura, not intractable, without status migrainosus: Secondary | ICD-10-CM | POA: Diagnosis not present

## 2020-03-02 DIAGNOSIS — G44221 Chronic tension-type headache, intractable: Secondary | ICD-10-CM | POA: Diagnosis not present

## 2020-03-02 MED ORDER — RIZATRIPTAN BENZOATE 10 MG PO TBDP
10.0000 mg | ORAL_TABLET | ORAL | 11 refills | Status: DC | PRN
Start: 2020-03-02 — End: 2020-04-29

## 2020-03-02 MED ORDER — METHYLPREDNISOLONE 4 MG PO TBPK: ORAL_TABLET | ORAL | 0 refills | Status: DC

## 2020-03-02 MED ORDER — NORTRIPTYLINE HCL 25 MG PO CAPS: 25.0000 mg | ORAL_CAPSULE | Freq: Every day | ORAL | 3 refills | Status: DC

## 2020-03-02 NOTE — Patient Instructions (Signed)
Start Nortriptyline at bedtime, good medication for tension-type headaches and migraines Medrol dosepak for 6 days (steroids to try to help in the immediate) For headache or migraine; try maxalt(Rizatriptan): Please take one tablet at the onset of your headache. If it does not improve the symptoms please take one additional tablet. Do not take more then 2 tablets in 24hrs. Do not take use more then 2 to 3 times in a week.   Tension Headache, Adult A tension headache is pain, pressure, or aching in your head. Tension headaches can last from 30 minutes to several days. Follow these instructions at home: Managing pain  Take over-the-counter and prescription medicines only as told by your doctor.  When you have a headache, lie down in a dark, quiet room.  If told, put ice on your head and neck: ? Put ice in a plastic bag. ? Place a towel between your skin and the bag. ? Leave the ice on for 20 minutes, 2-3 times a day.  If told, put heat on the back of your neck. Do this as often as your doctor tells you to. Use the kind of heat that your doctor recommends, such as a moist heat pack or a heating pad. ? Place a towel between your skin and the heat. ? Leave the heat on for 20-30 minutes. ? Remove the heat if your skin turns bright red. Eating and drinking  Eat meals on a regular schedule.  Watch how much alcohol you drink: ? If you are a woman and are not pregnant, do not drink more than 1 drink a day. ? If you are a man, do not drink more than 2 drinks a day.  Drink enough fluid to keep your pee (urine) pale yellow.  Do not use a lot of caffeine, or stop using caffeine. Lifestyle  Get enough sleep. Get 7-9 hours of sleep each night. Or get the amount of sleep that your doctor tells you to.  At bedtime, remove all electronic devices from your room. Examples of electronic devices are computers, phones, and tablets.  Find ways to lessen your stress. Some things that can lessen stress  are: ? Exercise. ? Deep breathing. ? Yoga. ? Music. ? Positive thoughts.  Sit up straight. Do not tighten (tense) your muscles.  Do not use any products that have nicotine or tobacco in them, such as cigarettes and e-cigarettes. If you need help quitting, ask your doctor. General instructions   Keep all follow-up visits as told by your doctor. This is important.  Avoid things that can bring on headaches. Keep a journal to find out if certain things bring on headaches. For example, write down: ? What you eat and drink. ? How much sleep you get. ? Any change to your diet or medicines. Contact a doctor if:  Your headache does not get better.  Your headache comes back.  You have a headache and sounds, light, or smells bother you.  You feel sick to your stomach (nauseous) or you throw up (vomit).  Your stomach hurts. Get help right away if:  You suddenly get a very bad headache along with any of these: ? A stiff neck. ? Feeling sick to your stomach. ? Throwing up. ? Feeling weak. ? Trouble seeing. ? Feeling short of breath. ? A rash. ? Feeling unusually sleepy. ? Trouble speaking. ? Pain in your eye or ear. ? Trouble walking or balancing. ? Feeling like you will pass out (faint). ? Passing out. Summary  A tension headache is pain, pressure, or aching in your head.  Tension headaches can last from 30 minutes to several days.  Lifestyle changes and medicines may help relieve pain. This information is not intended to replace advice given to you by your health care provider. Make sure you discuss any questions you have with your health care provider. Document Revised: 08/20/2019 Document Reviewed: 02/02/2017 Elsevier Patient Education  [place].  Sleep Apnea Sleep apnea affects breathing during sleep. It causes breathing to stop for a short time or to become shallow. It can also increase the risk of:  Heart attack.  Stroke.  Being very overweight  (obese).  Diabetes.  Heart failure.  Irregular heartbeat. The goal of treatment is to help you breathe normally again. What are the causes? There are three kinds of sleep apnea:  Obstructive sleep apnea. This is caused by a blocked or collapsed airway.  Central sleep apnea. This happens when the brain does not send the right signals to the muscles that control breathing.  Mixed sleep apnea. This is a combination of obstructive and central sleep apnea. The most common cause of this condition is a collapsed or blocked airway. This can happen if:  Your throat muscles are too relaxed.  Your tongue and tonsils are too large.  You are overweight.  Your airway is too small. What increases the risk?  Being overweight.  Smoking.  Having a small airway.  Being older.  Being male.  Drinking alcohol.  Taking medicines to calm yourself (sedatives or tranquilizers).  Having family members with the condition. What are the signs or symptoms?  Trouble staying asleep.  Being sleepy or tired during the day.  Getting angry a lot.  Loud snoring.  Headaches in the morning.  Not being able to focus your mind (concentrate).  Forgetting things.  Less interest in sex.  Mood swings.  Personality changes.  Feelings of sadness (depression).  Waking up a lot during the night to pee (urinate).  Dry mouth.  Sore throat. How is this diagnosed?  Your medical history.  A physical exam.  A test that is done when you are sleeping (sleep study). The test is most often done in a sleep lab but may also be done at home. How is this treated?   Sleeping on your side.  Using a medicine to get rid of mucus in your nose (decongestant).  Avoiding the use of alcohol, medicines to help you relax, or certain pain medicines (narcotics).  Losing weight, if needed.  Changing your diet.  Not smoking.  Using a machine to open your airway while you sleep, such as: ? An oral  appliance. This is a mouthpiece that shifts your lower jaw forward. ? A CPAP device. This device blows air through a mask when you breathe out (exhale). ? An EPAP device. This has valves that you put in each nostril. ? A BPAP device. This device blows air through a mask when you breathe in (inhale) and breathe out.  Having surgery if other treatments do not work. It is important to get treatment for sleep apnea. Without treatment, it can lead to:  High blood pressure.  Coronary artery disease.  In men, not being able to have an erection (impotence).  Reduced thinking ability. Follow these instructions at home: Lifestyle  Make changes that your doctor recommends.  Eat a healthy diet.  Lose weight if needed.  Avoid alcohol, medicines to help you relax, and some pain medicines.  Do  not use any products that contain nicotine or tobacco, such as cigarettes, e-cigarettes, and chewing tobacco. If you need help quitting, ask your doctor. General instructions  Take over-the-counter and prescription medicines only as told by your doctor.  If you were given a machine to use while you sleep, use it only as told by your doctor.  If you are having surgery, make sure to tell your doctor you have sleep apnea. You may need to bring your device with you.  Keep all follow-up visits as told by your doctor. This is important. Contact a doctor if:  The machine that you were given to use during sleep bothers you or does not seem to be working.  You do not get better.  You get worse. Get help right away if:  Your chest hurts.  You have trouble breathing in enough air.  You have an uncomfortable feeling in your back, arms, or stomach.  You have trouble talking.  One side of your body feels weak.  A part of your face is hanging down. These symptoms may be an emergency. Do not wait to see if the symptoms will go away. Get medical help right away. Call your local emergency services (911  in the [place] Do not drive yourself to the hospital. Summary  This condition affects breathing during sleep.  The most common cause is a collapsed or blocked airway.  The goal of treatment is to help you breathe normally while you sleep. This information is not intended to replace advice given to you by your health care provider. Make sure you discuss any questions you have with your health care provider. Document Revised: 08/09/2018 Document Reviewed: 06/18/2018 Elsevier Patient Education  [place].  Migraine Headache A migraine headache is an intense, throbbing pain on one side or both sides of the head. Migraine headaches may also cause other symptoms, such as nausea, vomiting, and sensitivity to light and noise. A migraine headache can last from 4 hours to 3 days. Talk with your doctor about what things may bring on (trigger) your migraine headaches. What are the causes? The exact cause of this condition is not known. However, a migraine may be caused when nerves in the brain become irritated and release chemicals that cause inflammation of blood vessels. This inflammation causes pain. This condition may be triggered or caused by:  Drinking alcohol.  Smoking.  Taking medicines, such as: ? Medicine used to treat chest pain (nitroglycerin). ? Birth control pills. ? Estrogen. ? Certain blood pressure medicines.  Eating or drinking products that contain nitrates, glutamate, aspartame, or tyramine. Aged cheeses, chocolate, or caffeine may also be triggers.  Doing physical activity. Other things that may trigger a migraine headache include:  Menstruation.  Pregnancy.  Hunger.  Stress.  Lack of sleep or too much sleep.  Weather changes.  Fatigue. What increases the risk? The following factors may make you more likely to experience migraine headaches:  Being a certain age. This condition is more common in people who are 77-72 years old.  Being male.  Having  a family history of migraine headaches.  Being Caucasian.  Having a mental health condition, such as depression or anxiety.  Being obese. What are the signs or symptoms? The main symptom of this condition is pulsating or throbbing pain. This pain may:  Happen in any area of the head, such as on one side or both sides.  Interfere with daily activities.  Get worse with physical activity.  Get worse  with exposure to bright lights or loud noises. Other symptoms may include:  Nausea.  Vomiting.  Dizziness.  General sensitivity to bright lights, loud noises, or smells. Before you get a migraine headache, you may get warning signs (an aura). An aura may include:  Seeing flashing lights or having blind spots.  Seeing bright spots, halos, or zigzag lines.  Having tunnel vision or blurred vision.  Having numbness or a tingling feeling.  Having trouble talking.  Having muscle weakness. Some people have symptoms after a migraine headache (postdromal phase), such as:  Feeling tired.  Difficulty concentrating. How is this diagnosed? A migraine headache can be diagnosed based on:  Your symptoms.  A physical exam.  Tests, such as: ? CT scan or an MRI of the head. These imaging tests can help rule out other causes of headaches. ? Taking fluid from the spine (lumbar puncture) and analyzing it (cerebrospinal fluid analysis, or CSF analysis). How is this treated? This condition may be treated with medicines that:  Relieve pain.  Relieve nausea.  Prevent migraine headaches. Treatment for this condition may also include:  Acupuncture.  Lifestyle changes like avoiding foods that trigger migraine headaches.  Biofeedback.  Cognitive behavioral therapy. Follow these instructions at home: Medicines  Take over-the-counter and prescription medicines only as told by your health care provider.  Ask your health care provider if the medicine prescribed to you: ? Requires  you to avoid driving or using heavy machinery. ? Can cause constipation. You may need to take these actions to prevent or treat constipation:  Drink enough fluid to keep your urine pale yellow.  Take over-the-counter or prescription medicines.  Eat foods that are high in fiber, such as beans, whole grains, and fresh fruits and vegetables.  Limit foods that are high in fat and processed sugars, such as fried or sweet foods. Lifestyle  Do not drink alcohol.  Do not use any products that contain nicotine or tobacco, such as cigarettes, e-cigarettes, and chewing tobacco. If you need help quitting, ask your health care provider.  Get at least 8 hours of sleep every night.  Find ways to manage stress, such as meditation, deep breathing, or yoga. General instructions      Keep a journal to find out what may trigger your migraine headaches. For example, write down: ? What you eat and drink. ? How much sleep you get. ? Any change to your diet or medicines.  If you have a migraine headache: ? Avoid things that make your symptoms worse, such as bright lights. ? It may help to lie down in a dark, quiet room. ? Do not drive or use heavy machinery. ? Ask your health care provider what activities are safe for you while you are experiencing symptoms.  Keep all follow-up visits as told by your health care provider. This is important. Contact a health care provider if:  You develop symptoms that are different or more severe than your usual migraine headache symptoms.  You have more than 15 headache days in one month. Get help right away if:  Your migraine headache becomes severe.  Your migraine headache lasts longer than 72 hours.  You have a fever.  You have a stiff neck.  You have vision loss.  Your muscles feel weak or like you cannot control them.  You start to lose your balance often.  You have trouble walking.  You faint.  You have a seizure. Summary  A migraine  headache is an intense, throbbing  pain on one side or both sides of the head. Migraines may also cause other symptoms, such as nausea, vomiting, and sensitivity to light and noise.  This condition may be treated with medicines and lifestyle changes. You may also need to avoid certain things that trigger a migraine headache.  Keep a journal to find out what may trigger your migraine headaches.  Contact your health care provider if you have more than 15 headache days in a month or you develop symptoms that are different or more severe than your usual migraine headache symptoms. This information is not intended to replace advice given to you by your health care provider. Make sure you discuss any questions you have with your health care provider. Document Revised: 02/14/2019 Document Reviewed: 12/05/2018 Elsevier Patient Education  [place]. Rizatriptan disintegrating tablets What is this medicine? RIZATRIPTAN (rye za TRIP tan) is used to treat migraines with or without aura. An aura is a strange feeling or visual disturbance that warns you of an attack. It is not used to prevent migraines. This medicine may be used for other purposes; ask your health care provider or pharmacist if you have questions. COMMON BRAND NAME(S): Maxalt-MLT What should I tell my health care provider before I take this medicine? They need to know if you have any of these conditions:  cigarette smoker  circulation problems in fingers and toes  diabetes  heart disease  high blood pressure  high cholesterol  history of irregular heartbeat  history of stroke  kidney disease  liver disease  stomach or intestine problems  an unusual or allergic reaction to rizatriptan, other medicines, foods, dyes, or preservatives  pregnant or trying to get pregnant  breast-feeding How should I use this medicine? Take this medicine by mouth. Follow the directions on the prescription label. Leave the tablet in  the sealed blister pack until you are ready to take it. With dry hands, open the blister and gently remove the tablet. If the tablet breaks or crumbles, throw it away and take a new tablet out of the blister pack. Place the tablet in the mouth and allow it to dissolve, and then swallow. Do not cut, crush, or chew this medicine. You do not need water to take this medicine. Do not take it more often than directed. Talk to your pediatrician regarding the use of this medicine in children. While this drug may be prescribed for children as young as 6 years for selected conditions, precautions do apply. Overdosage: If you think you have taken too much of this medicine contact a poison control center or emergency room at once. NOTE: This medicine is only for you. Do not share this medicine with others. What if I miss a dose? This does not apply. This medicine is not for regular use. What may interact with this medicine? Do not take this medicine with any of the following medicines:  certain medicines for migraine headache like almotriptan, eletriptan, frovatriptan, naratriptan, rizatriptan, sumatriptan, zolmitriptan  ergot alkaloids like dihydroergotamine, ergonovine, ergotamine, methylergonovine  MAOIs like Carbex, Eldepryl, Marplan, Nardil, and Parnate This medicine may also interact with the following medications:  certain medicines for depression, anxiety, or psychotic disorders  propranolol This list may not describe all possible interactions. Give your health care provider a list of all the medicines, herbs, non-prescription drugs, or dietary supplements you use. Also tell them if you smoke, drink alcohol, or use illegal drugs. Some items may interact with your medicine. What should I watch for while  using this medicine? Visit your healthcare professional for regular checks on your progress. Tell your healthcare professional if your symptoms do not start to get better or if they get worse. You  may get drowsy or dizzy. Do not drive, use machinery, or do anything that needs mental alertness until you know how this medicine affects you. Do not stand up or sit up quickly, especially if you are an older patient. This reduces the risk of dizzy or fainting spells. Alcohol may interfere with the effect of this medicine. Your mouth may get dry. Chewing sugarless gum or sucking hard candy and drinking plenty of water may help. Contact your healthcare professional if the problem does not go away or is severe. If you take migraine medicines for 10 or more days a month, your migraines may get worse. Keep a diary of headache days and medicine use. Contact your healthcare professional if your migraine attacks occur more frequently. What side effects may I notice from receiving this medicine? Side effects that you should report to your doctor or health care professional as soon as possible:  allergic reactions like skin rash, itching or hives, swelling of the face, lips, or tongue  chest pain or chest tightness  signs and symptoms of a dangerous change in heartbeat or heart rhythm like chest pain; dizziness; fast, irregular heartbeat; palpitations; feeling faint or lightheaded; falls; breathing problems  signs and symptoms of a stroke like changes in vision; confusion; trouble speaking or understanding; severe headaches; sudden numbness or weakness of the face, arm or leg; trouble walking; dizziness; loss of balance or coordination  signs and symptoms of serotonin syndrome like irritable; confusion; diarrhea; fast or irregular heartbeat; muscle twitching; stiff muscles; trouble walking; sweating; high fever; seizures; chills; vomiting Side effects that usually do not require medical attention (report to your doctor or health care professional if they continue or are bothersome):  diarrhea  dizziness  drowsiness  dry mouth  headache  nausea, vomiting  pain, tingling, numbness in the hands or  feet  stomach pain This list may not describe all possible side effects. Call your doctor for medical advice about side effects. You may report side effects to FDA at 1-800-FDA-1088. Where should I keep my medicine? Keep out of the reach of children. Store at room temperature between 15 and 30 degrees C (59 and 86 degrees F). Protect from light and moisture. Throw away any unused medicine after the expiration date. NOTE: This sheet is a summary. It may not cover all possible information. If you have questions about this medicine, talk to your doctor, pharmacist, or health care provider.  2020 Elsevier/Gold Standard (2018-05-07 14:58:08) Methylprednisolone tablets What is this medicine? METHYLPREDNISOLONE (meth ill pred NISS oh lone) is a corticosteroid. It is commonly used to treat inflammation of the skin, joints, lungs, and other organs. Common conditions treated include asthma, allergies, and arthritis. It is also used for other conditions, such as blood disorders and diseases of the adrenal glands. This medicine may be used for other purposes; ask your health care provider or pharmacist if you have questions. COMMON BRAND NAME(S): Medrol, Medrol Dosepak What should I tell my health care provider before I take this medicine? They need to know if you have any of these conditions:  Cushing's syndrome  eye disease, vision problems  diabetes  glaucoma  heart disease  high blood pressure  infection (especially a virus infection such as chickenpox, cold sores, or herpes)  liver disease  mental illness  myasthenia  gravis  osteoporosis  recently received or scheduled to receive a vaccine  seizures  stomach or intestine problems  thyroid disease  an unusual or allergic reaction to lactose, methylprednisolone, other medicines, foods, dyes, or preservatives  pregnant or trying to get pregnant  breast-feeding How should I use this medicine? Take this medicine by mouth  with a glass of water. Follow the directions on the prescription label. Take this medicine with food. If you are taking this medicine once a day, take it in the morning. Do not take it more often than directed. Do not suddenly stop taking your medicine because you may develop a severe reaction. Your doctor will tell you how much medicine to take. If your doctor wants you to stop the medicine, the dose may be slowly lowered over time to avoid any side effects. Talk to your pediatrician regarding the use of this medicine in children. Special care may be needed. Overdosage: If you think you have taken too much of this medicine contact a poison control center or emergency room at once. NOTE: This medicine is only for you. Do not share this medicine with others. What if I miss a dose? If you miss a dose, take it as soon as you can. If it is almost time for your next dose, talk to your doctor or health care professional. You may need to miss a dose or take an extra dose. Do not take double or extra doses without advice. What may interact with this medicine? Do not take this medicine with any of the following medications:  alefacept  echinacea  live virus vaccines  metyrapone  mifepristone This medicine may also interact with the following medications:  amphotericin B  aspirin and aspirin-like medicines  certain antibiotics like erythromycin, clarithromycin, troleandomycin  certain medicines for diabetes  certain medicines for fungal infections like ketoconazole  certain medicines for seizures like carbamazepine, phenobarbital, phenytoin  certain medicines that treat or prevent blood clots like warfarin  cholestyramine  cyclosporine  digoxin  diuretics  male hormones, like estrogens and birth control pills  isoniazid  NSAIDs, medicines for pain inflammation, like ibuprofen or naproxen  other medicines for myasthenia gravis  rifampin  vaccines This list may not describe  all possible interactions. Give your health care provider a list of all the medicines, herbs, non-prescription drugs, or dietary supplements you use. Also tell them if you smoke, drink alcohol, or use illegal drugs. Some items may interact with your medicine. What should I watch for while using this medicine? Tell your doctor or healthcare professional if your symptoms do not start to get better or if they get worse. Do not stop taking except on your doctor's advice. You may develop a severe reaction. Your doctor will tell you how much medicine to take. This medicine may increase your risk of getting an infection. Tell your doctor or health care professional if you are around anyone with measles or chickenpox, or if you develop sores or blisters that do not heal properly. This medicine may increase blood sugar levels. Ask your healthcare provider if changes in diet or medicines are needed if you have diabetes. Tell your doctor or health care professional right away if you have any change in your eyesight. Using this medicine for a long time may increase your risk of low bone mass. Talk to your doctor about bone health. What side effects may I notice from receiving this medicine? Side effects that you should report to your doctor or health care  professional as soon as possible:  allergic reactions like skin rash, itching or hives, swelling of the face, lips, or tongue  bloody or tarry stools  hallucination, loss of contact with reality  muscle cramps  muscle pain  palpitations  signs and symptoms of high blood sugar such as being more thirsty or hungry or having to urinate more than normal. You may also feel very tired or have blurry vision.  signs and symptoms of infection like fever or chills; cough; sore throat; pain or trouble passing urine Side effects that usually do not require medical attention (report to your doctor or health care professional if they continue or are  bothersome):  changes in emotions or mood  constipation  diarrhea  excessive hair growth on the face or body  headache  nausea, vomiting  trouble sleeping  weight gain This list may not describe all possible side effects. Call your doctor for medical advice about side effects. You may report side effects to FDA at 1-800-FDA-1088. Where should I keep my medicine? Keep out of the reach of children. Store at room temperature between 20 and 25 degrees C (68 and 77 degrees F). Throw away any unused medicine after the expiration date. NOTE: This sheet is a summary. It may not cover all possible information. If you have questions about this medicine, talk to your doctor, pharmacist, or health care provider.  2020 Elsevier/Gold Standard (2018-07-25 09:19:36)   Nortriptyline capsules What is this medicine? NORTRIPTYLINE (nor TRIP ti leen) was used mostly in the past to treat depression.Widely used for headaches. This medicine may be used for other purposes; ask your health care provider or pharmacist if you have questions. COMMON BRAND NAME(S): Aventyl, Pamelor What should I tell my health care provider before I take this medicine? They need to know if you have any of these conditions:  bipolar disorder  Brugada syndrome  difficulty passing urine  glaucoma  heart disease  if you drink alcohol  liver disease  schizophrenia  seizures  suicidal thoughts, plans or attempt; a previous suicide attempt by you or a family member  thyroid disease  an unusual or allergic reaction to nortriptyline, other tricyclic antidepressants, other medicines, foods, dyes, or preservatives  pregnant or trying to get pregnant  breast-feeding How should I use this medicine? Take this medicine by mouth with a glass of water. Follow the directions on the prescription label. Take your doses at regular intervals. Do not take it more often than directed. Do not stop taking this medicine  suddenly except upon the advice of your doctor. Stopping this medicine too quickly may cause serious side effects or your condition may worsen. A special MedGuide will be given to you by the pharmacist with each prescription and refill. Be sure to read this information carefully each time. Talk to your pediatrician regarding the use of this medicine in children. Special care may be needed. Overdosage: If you think you have taken too much of this medicine contact a poison control center or emergency room at once. NOTE: This medicine is only for you. Do not share this medicine with others. What if I miss a dose? If you miss a dose, take it as soon as you can. If it is almost time for your next dose, take only that dose. Do not take double or extra doses. What may interact with this medicine? Do not take this medicine with any of the following medications:  cisapride  dronedarone  linezolid  MAOIs like Carbex, Eldepryl,  Marplan, Nardil, and Parnate  methylene blue (injected into a vein)  pimozide  thioridazine This medicine may also interact with the following medications:  alcohol  antihistamines for allergy, cough, and cold  atropine  certain medicines for bladder problems like oxybutynin, tolterodine  certain medicines for depression like amitriptyline, fluoxetine, sertraline  certain medicines for Parkinson's disease like benztropine, trihexyphenidyl  certain medicines for stomach problems like dicyclomine, hyoscyamine  certain medicines for travel sickness like scopolamine  chlorpropamide  cimetidine  ipratropium  other medicines that prolong the QT interval (an abnormal heart rhythm) like dofetilide  other medicines that can cause serotonin syndrome like St. John's Wort, fentanyl, lithium, tramadol, tryptophan, buspirone, and some medicines for headaches like sumatriptan or rizatriptan  quinidine  reserpine  thyroid medicine This list may not describe all  possible interactions. Give your health care provider a list of all the medicines, herbs, non-prescription drugs, or dietary supplements you use. Also tell them if you smoke, drink alcohol, or use illegal drugs. Some items may interact with your medicine. What should I watch for while using this medicine? Tell your doctor if your symptoms do not get better or if they get worse. Visit your doctor or health care professional for regular checks on your progress. Because it may take several weeks to see the full effects of this medicine, it is important to continue your treatment as prescribed by your doctor. Patients and their families should watch out for new or worsening thoughts of suicide or depression. Also watch out for sudden changes in feelings such as feeling anxious, agitated, panicky, irritable, hostile, aggressive, impulsive, severely restless, overly excited and hyperactive, or not being able to sleep. If this happens, especially at the beginning of treatment or after a change in dose, call your health care professional. Dennis Bast may get drowsy or dizzy. Do not drive, use machinery, or do anything that needs mental alertness until you know how this medicine affects you. Do not stand or sit up quickly, especially if you are an older patient. This reduces the risk of dizzy or fainting spells. Alcohol may interfere with the effect of this medicine. Avoid alcoholic drinks. Do not treat yourself for coughs, colds, or allergies without asking your doctor or health care professional for advice. Some ingredients can increase possible side effects. Your mouth may get dry. Chewing sugarless gum or sucking hard candy, and drinking plenty of water may help. Contact your doctor if the problem does not go away or is severe. This medicine may cause dry eyes and blurred vision. If you wear contact lenses you may feel some discomfort. Lubricating drops may help. See your eye doctor if the problem does not go away or is  severe. This medicine can cause constipation. Try to have a bowel movement at least every 2 to 3 days. If you do not have a bowel movement for 3 days, call your doctor or health care professional. This medicine can make you more sensitive to the sun. Keep out of the sun. If you cannot avoid being in the sun, wear protective clothing and use sunscreen. Do not use sun lamps or tanning beds/booths. What side effects may I notice from receiving this medicine? Side effects that you should report to your doctor or health care professional as soon as possible:  allergic reactions like skin rash, itching or hives, swelling of the face, lips, or tongue  anxious  breathing problems  changes in vision  confusion  elevated mood, decreased need for sleep, racing  thoughts, impulsive behavior  eye pain  fast, irregular heartbeat  feeling faint or lightheaded, falls  feeling agitated, angry, or irritable  fever with increased sweating  hallucination, loss of contact with reality  seizures  stiff muscles  suicidal thoughts or other mood changes  tingling, pain, or numbness in the feet or hands  trouble passing urine or change in the amount of urine  trouble sleeping  unusually weak or tired  vomiting  yellowing of the eyes or skin Side effects that usually do not require medical attention (report to your doctor or health care professional if they continue or are bothersome):  change in sex drive or performance  change in appetite or weight  constipation  dizziness  dry mouth  nausea  tired  tremors  upset stomach This list may not describe all possible side effects. Call your doctor for medical advice about side effects. You may report side effects to FDA at 1-800-FDA-1088. Where should I keep my medicine? Keep out of the reach of children. Store at room temperature between 15 and 30 degrees C (59 and 86 degrees F). Keep container tightly closed. Throw away any  unused medicine after the expiration date. NOTE: This sheet is a summary. It may not cover all possible information. If you have questions about this medicine, talk to your doctor, pharmacist, or health care provider.  2020 Elsevier/Gold Standard (2018-10-15 13:24:58)

## 2020-03-02 NOTE — Progress Notes (Signed)
Epworth Sleepiness Scale 0= would never doze 1= slight chance of dozing 2= moderate chance of dozing 3= high chance of dozing  Sitting and reading: 1 Watching TV: 1 Sitting inactive in a public place (ex. Theater or meeting): 0 As a passenger in a car for an hour without a break: 0 Lying down to rest in the afternoon: 2 Sitting and talking to someone: 0 Sitting quietly after lunch (no alcohol): 1 In a car, while stopped in traffic: 0 Total: 5

## 2020-03-03 LAB — TSH: TSH: 1.75 u[IU]/mL (ref 0.450–4.500)

## 2020-03-17 ENCOUNTER — Encounter: Payer: Self-pay | Admitting: Internal Medicine

## 2020-03-17 ENCOUNTER — Telehealth: Payer: Self-pay | Admitting: Internal Medicine

## 2020-03-18 MED ORDER — AMPHETAMINE-DEXTROAMPHETAMINE 10 MG PO TABS: ORAL_TABLET | ORAL | 0 refills | Status: DC

## 2020-03-18 NOTE — Telephone Encounter (Signed)
PDMP okay, Rx sent 

## 2020-03-18 NOTE — Telephone Encounter (Signed)
Adderall refill.   Last OV: 01/27/2020 Last Fill: 01/09/2020 #180 and 0RF Pt sig: 2 tabs in AM and 1 in PM prn UDS: 08/06/2019 Low risk

## 2020-04-29 ENCOUNTER — Encounter: Payer: Self-pay | Admitting: Family Medicine

## 2020-04-29 ENCOUNTER — Ambulatory Visit: Payer: BC Managed Care – PPO | Admitting: Family Medicine

## 2020-04-29 VITALS — BP 117/74 | HR 101 | Ht 71.0 in | Wt 224.0 lb

## 2020-04-29 DIAGNOSIS — G44221 Chronic tension-type headache, intractable: Secondary | ICD-10-CM | POA: Diagnosis not present

## 2020-04-29 DIAGNOSIS — G43009 Migraine without aura, not intractable, without status migrainosus: Secondary | ICD-10-CM | POA: Diagnosis not present

## 2020-04-29 MED ORDER — AMITRIPTYLINE HCL 25 MG PO TABS
25.0000 mg | ORAL_TABLET | Freq: Every day | ORAL | 3 refills | Status: DC
Start: 2020-04-29 — End: 2020-07-15

## 2020-04-29 NOTE — Patient Instructions (Addendum)
We will try amitriptyline 25mg  daily. Continue Tylenol as needed but try to avoid regular use. We can consider another abortive medication if you wish.   Healthy, well balanced diet and regular exercise encouraged.   Follow up in 3-6 months    Amitriptyline tablets What is this medicine? AMITRIPTYLINE (a mee TRIP ti leen) is used to treat depression. This medicine may be used for other purposes; ask your health care provider or pharmacist if you have questions. COMMON BRAND NAME(S): Elavil, Vanatrip What should I tell my health care provider before I take this medicine? They need to know if you have any of these conditions:  an alcohol problem  asthma, difficulty breathing  bipolar disorder or schizophrenia  difficulty passing urine, prostate trouble  glaucoma  heart disease or previous heart attack  liver disease  over active thyroid  seizures  thoughts or plans of suicide, a previous suicide attempt, or family history of suicide attempt  an unusual or allergic reaction to amitriptyline, other medicines, foods, dyes, or preservatives  pregnant or trying to get pregnant  breast-feeding How should I use this medicine? Take this medicine by mouth with a drink of water. Follow the directions on the prescription label. You can take the tablets with or without food. Take your medicine at regular intervals. Do not take it more often than directed. Do not stop taking this medicine suddenly except upon the advice of your doctor. Stopping this medicine too quickly may cause serious side effects or your condition may worsen. A special MedGuide will be given to you by the pharmacist with each prescription and refill. Be sure to read this information carefully each time. Talk to your pediatrician regarding the use of this medicine in children. Special care may be needed. Overdosage: If you think you have taken too much of this medicine contact a poison control center or emergency  room at once. NOTE: This medicine is only for you. Do not share this medicine with others. What if I miss a dose? If you miss a dose, take it as soon as you can. If it is almost time for your next dose, take only that dose. Do not take double or extra doses. What may interact with this medicine? Do not take this medicine with any of the following medications:  arsenic trioxide  certain medicines used to regulate abnormal heartbeat or to treat other heart conditions  cisapride  droperidol  halofantrine  linezolid  MAOIs like Carbex, Eldepryl, Marplan, Nardil, and Parnate  methylene blue  other medicines for mental depression  phenothiazines like perphenazine, thioridazine and chlorpromazine  pimozide  probucol  procarbazine  sparfloxacin  St. John's Wort This medicine may also interact with the following medications:  atropine and related drugs like hyoscyamine, scopolamine, tolterodine and others  barbiturate medicines for inducing sleep or treating seizures, like phenobarbital  cimetidine  disulfiram  ethchlorvynol  thyroid hormones such as levothyroxine  ziprasidone This list may not describe all possible interactions. Give your health care provider a list of all the medicines, herbs, non-prescription drugs, or dietary supplements you use. Also tell them if you smoke, drink alcohol, or use illegal drugs. Some items may interact with your medicine. What should I watch for while using this medicine? Tell your doctor if your symptoms do not get better or if they get worse. Visit your doctor or health care professional for regular checks on your progress. Because it may take several weeks to see the full effects of this medicine, it  is important to continue your treatment as prescribed by your doctor. Patients and their families should watch out for new or worsening thoughts of suicide or depression. Also watch out for sudden changes in feelings such as feeling  anxious, agitated, panicky, irritable, hostile, aggressive, impulsive, severely restless, overly excited and hyperactive, or not being able to sleep. If this happens, especially at the beginning of treatment or after a change in dose, call your health care professional. Dennis Bast may get drowsy or dizzy. Do not drive, use machinery, or do anything that needs mental alertness until you know how this medicine affects you. Do not stand or sit up quickly, especially if you are an older patient. This reduces the risk of dizzy or fainting spells. Alcohol may interfere with the effect of this medicine. Avoid alcoholic drinks. Do not treat yourself for coughs, colds, or allergies without asking your doctor or health care professional for advice. Some ingredients can increase possible side effects. Your mouth may get dry. Chewing sugarless gum or sucking hard candy, and drinking plenty of water will help. Contact your doctor if the problem does not go away or is severe. This medicine may cause dry eyes and blurred vision. If you wear contact lenses you may feel some discomfort. Lubricating drops may help. See your eye doctor if the problem does not go away or is severe. This medicine can cause constipation. Try to have a bowel movement at least every 2 to 3 days. If you do not have a bowel movement for 3 days, call your doctor or health care professional. This medicine can make you more sensitive to the sun. Keep out of the sun. If you cannot avoid being in the sun, wear protective clothing and use sunscreen. Do not use sun lamps or tanning beds/booths. What side effects may I notice from receiving this medicine? Side effects that you should report to your doctor or health care professional as soon as possible:  allergic reactions like skin rash, itching or hives, swelling of the face, lips, or tongue  anxious  breathing problems  changes in vision  confusion  elevated mood, decreased need for sleep, racing  thoughts, impulsive behavior  eye pain  fast, irregular heartbeat  feeling faint or lightheaded, falls  feeling agitated, angry, or irritable  fever with increased sweating  hallucination, loss of contact with reality  seizures  stiff muscles  suicidal thoughts or other mood changes  tingling, pain, or numbness in the feet or hands  trouble passing urine or change in the amount of urine  trouble sleeping  unusually weak or tired  vomiting  yellowing of the eyes or skin Side effects that usually do not require medical attention (report to your doctor or health care professional if they continue or are bothersome):  change in sex drive or performance  change in appetite or weight  constipation  dizziness  dry mouth  nausea  tired  tremors  upset stomach This list may not describe all possible side effects. Call your doctor for medical advice about side effects. You may report side effects to FDA at 1-800-FDA-1088. Where should I keep my medicine? Keep out of the reach of children. Store at room temperature between 20 and 25 degrees C (68 and 77 degrees F). Throw away any unused medicine after the expiration date. NOTE: This sheet is a summary. It may not cover all possible information. If you have questions about this medicine, talk to your doctor, pharmacist, or health care provider.  2020 Elsevier/Gold Standard (2018-10-15 13:04:32)   Migraine Headache A migraine headache is a very strong throbbing pain on one side or both sides of your head. This type of headache can also cause other symptoms. It can last from 4 hours to 3 days. Talk with your doctor about what things may bring on (trigger) this condition. What are the causes? The exact cause of this condition is not known. This condition may be triggered or caused by:  Drinking alcohol.  Smoking.  Taking medicines, such as: ? Medicine used to treat chest pain (nitroglycerin). ? Birth control  pills. ? Estrogen. ? Some blood pressure medicines.  Eating or drinking certain products.  Doing physical activity. Other things that may trigger a migraine headache include:  Having a menstrual period.  Pregnancy.  Hunger.  Stress.  Not getting enough sleep or getting too much sleep.  Weather changes.  Tiredness (fatigue). What increases the risk?  Being 42-65 years old.  Being male.  Having a family history of migraine headaches.  Being Caucasian.  Having depression or anxiety.  Being very overweight. What are the signs or symptoms?  A throbbing pain. This pain may: ? Happen in any area of the head, such as on one side or both sides. ? Make it hard to do daily activities. ? Get worse with physical activity. ? Get worse around bright lights or loud noises.  Other symptoms may include: ? Feeling sick to your stomach (nauseous). ? Vomiting. ? Dizziness. ? Being sensitive to bright lights, loud noises, or smells.  Before you get a migraine headache, you may get warning signs (an aura). An aura may include: ? Seeing flashing lights or having blind spots. ? Seeing bright spots, halos, or zigzag lines. ? Having tunnel vision or blurred vision. ? Having numbness or a tingling feeling. ? Having trouble talking. ? Having weak muscles.  Some people have symptoms after a migraine headache (postdromal phase), such as: ? Tiredness. ? Trouble thinking (concentrating). How is this treated?  Taking medicines that: ? Relieve pain. ? Relieve the feeling of being sick to your stomach. ? Prevent migraine headaches.  Treatment may also include: ? Having acupuncture. ? Avoiding foods that bring on migraine headaches. ? Learning ways to control your body functions (biofeedback). ? Therapy to help you know and deal with negative thoughts (cognitive behavioral therapy). Follow these instructions at home: Medicines  Take over-the-counter and prescription medicines  only as told by your doctor.  Ask your doctor if the medicine prescribed to you: ? Requires you to avoid driving or using heavy machinery. ? Can cause trouble pooping (constipation). You may need to take these steps to prevent or treat trouble pooping:  Drink enough fluid to keep your pee (urine) pale yellow.  Take over-the-counter or prescription medicines.  Eat foods that are high in fiber. These include beans, whole grains, and fresh fruits and vegetables.  Limit foods that are high in fat and sugar. These include fried or sweet foods. Lifestyle  Do not drink alcohol.  Do not use any products that contain nicotine or tobacco, such as cigarettes, e-cigarettes, and chewing tobacco. If you need help quitting, ask your doctor.  Get at least 8 hours of sleep every night.  Limit and deal with stress. General instructions      Keep a journal to find out what may bring on your migraine headaches. For example, write down: ? What you eat and drink. ? How much sleep you get. ? Any change  in what you eat or drink. ? Any change in your medicines.  If you have a migraine headache: ? Avoid things that make your symptoms worse, such as bright lights. ? It may help to lie down in a dark, quiet room. ? Do not drive or use heavy machinery. ? Ask your doctor what activities are safe for you.  Keep all follow-up visits as told by your doctor. This is important. Contact a doctor if:  You get a migraine headache that is different or worse than others you have had.  You have more than 15 headache days in one month. Get help right away if:  Your migraine headache gets very bad.  Your migraine headache lasts longer than 72 hours.  You have a fever.  You have a stiff neck.  You have trouble seeing.  Your muscles feel weak or like you cannot control them.  You start to lose your balance a lot.  You start to have trouble walking.  You pass out (faint).  You have a  seizure. Summary  A migraine headache is a very strong throbbing pain on one side or both sides of your head. These headaches can also cause other symptoms.  This condition may be treated with medicines and changes to your lifestyle.  Keep a journal to find out what may bring on your migraine headaches.  Contact a doctor if you get a migraine headache that is different or worse than others you have had.  Contact your doctor if you have more than 15 headache days in a month. This information is not intended to replace advice given to you by your health care provider. Make sure you discuss any questions you have with your health care provider. Document Revised: 02/14/2019 Document Reviewed: 12/05/2018 Elsevier Patient Education  [place].   Sleep Apnea Sleep apnea is a condition in which breathing pauses or becomes shallow during sleep. Episodes of sleep apnea usually last 10 seconds or longer, and they may occur as many as 20 times an hour. Sleep apnea disrupts your sleep and keeps your body from getting the rest that it needs. This condition can increase your risk of certain health problems, including:  Heart attack.  Stroke.  Obesity.  Diabetes.  Heart failure.  Irregular heartbeat. What are the causes? There are three kinds of sleep apnea:  Obstructive sleep apnea. This kind is caused by a blocked or collapsed airway.  Central sleep apnea. This kind happens when the part of the brain that controls breathing does not send the correct signals to the muscles that control breathing.  Mixed sleep apnea. This is a combination of obstructive and central sleep apnea. The most common cause of this condition is a collapsed or blocked airway. An airway can collapse or become blocked if:  Your throat muscles are abnormally relaxed.  Your tongue and tonsils are larger than normal.  You are overweight.  Your airway is smaller than normal. What increases the risk? You  are more likely to develop this condition if you:  Are overweight.  Smoke.  Have a smaller than normal airway.  Are elderly.  Are male.  Drink alcohol.  Take sedatives or tranquilizers.  Have a family history of sleep apnea. What are the signs or symptoms? Symptoms of this condition include:  Trouble staying asleep.  Daytime sleepiness and tiredness.  Irritability.  Loud snoring.  Morning headaches.  Trouble concentrating.  Forgetfulness.  Decreased interest in sex.  Unexplained sleepiness.  Mood swings.  Personality changes.  Feelings of depression.  Waking up often during the night to urinate.  Dry mouth.  Sore throat. How is this diagnosed? This condition may be diagnosed with:  A medical history.  A physical exam.  A series of tests that are done while you are sleeping (sleep study). These tests are usually done in a sleep lab, but they may also be done at home. How is this treated? Treatment for this condition aims to restore normal breathing and to ease symptoms during sleep. It may involve managing health issues that can affect breathing, such as high blood pressure or obesity. Treatment may include:  Sleeping on your side.  Using a decongestant if you have nasal congestion.  Avoiding the use of depressants, including alcohol, sedatives, and narcotics.  Losing weight if you are overweight.  Making changes to your diet.  Quitting smoking.  Using a device to open your airway while you sleep, such as: ? An oral appliance. This is a custom-made mouthpiece that shifts your lower jaw forward. ? A continuous positive airway pressure (CPAP) device. This device blows air through a mask when you breathe out (exhale). ? A nasal expiratory positive airway pressure (EPAP) device. This device has valves that you put into each nostril. ? A bi-level positive airway pressure (BPAP) device. This device blows air through a mask when you breathe in  (inhale) and breathe out (exhale).  Having surgery if other treatments do not work. During surgery, excess tissue is removed to create a wider airway. It is important to get treatment for sleep apnea. Without treatment, this condition can lead to:  High blood pressure.  Coronary artery disease.  In men, an inability to achieve or maintain an erection (impotence).  Reduced thinking abilities. Follow these instructions at home: Lifestyle  Make any lifestyle changes that your health care provider recommends.  Eat a healthy, well-balanced diet.  Take steps to lose weight if you are overweight.  Avoid using depressants, including alcohol, sedatives, and narcotics.  Do not use any products that contain nicotine or tobacco, such as cigarettes, e-cigarettes, and chewing tobacco. If you need help quitting, ask your health care provider. General instructions  Take over-the-counter and prescription medicines only as told by your health care provider.  If you were given a device to open your airway while you sleep, use it only as told by your health care provider.  If you are having surgery, make sure to tell your health care provider you have sleep apnea. You may need to bring your device with you.  Keep all follow-up visits as told by your health care provider. This is important. Contact a health care provider if:  The device that you received to open your airway during sleep is uncomfortable or does not seem to be working.  Your symptoms do not improve.  Your symptoms get worse. Get help right away if:  You develop: ? Chest pain. ? Shortness of breath. ? Discomfort in your back, arms, or stomach.  You have: ? Trouble speaking. ? Weakness on one side of your body. ? Drooping in your face. These symptoms may represent a serious problem that is an emergency. Do not wait to see if the symptoms will go away. Get medical help right away. Call your local emergency services (911 in  the [place] Do not drive yourself to the hospital. Summary  Sleep apnea is a condition in which breathing pauses or becomes shallow during sleep.  The most common cause is  a collapsed or blocked airway.  The goal of treatment is to restore normal breathing and to ease symptoms during sleep. This information is not intended to replace advice given to you by your health care provider. Make sure you discuss any questions you have with your health care provider. Document Revised: 04/09/2019 Document Reviewed: 06/18/2018 Elsevier Patient Education  [place].

## 2020-04-29 NOTE — Progress Notes (Addendum)
PATIENT: Carl Beltran DOB: 10/14/1976  REASON FOR VISIT: follow up HISTORY FROM: patient  Chief Complaint  Patient presents with  . Follow-up    headaches rm 1, pt states no improvements, quit taking migraine meds      HISTORY OF PRESENT ILLNESS: Today 04/29/20 Carl Beltran is a 44 y.o. male here today for follow up for headaches/migraines. He was started on nortriptyline and rizatriptan at last visit in 02/2020. He took nortriptyline for about 1 month. Rizatriptan did help with abortive therapy but he feels that it caused gastric pain and bloating. He is now having daily headaches. He stopped antidepressants. He feels that depression is better off medication. He feels that sexual dysfunction is better off medication. He does not sleep well. He reports "tossing and turning" all night. He does snore. He has been told he should be tested for sleep apnea but does not feel he will wear CPAP.    HISTORY: (copied from Dr Cathren Laine note on 03/02/2020)  HPI:  Carl Beltran is a 44 y.o. male here as requested by Colon Branch, MD for new daily persistent headaches. PMHx ADHD, depression, ED, 1-2 migraines a year. I reviewed Dr. Ethel Rana notes, patient was seen for headache started complaining of headache approximately February of this year, stated the headache started about a month prior, nagging, daily, at different places sometimes behind one of his eyes, sometimes the top of the head other times the back of the head, tried using Tylenol, no associated nausea, phonophobia, question of photophobia also at the same time started having chest pain and worsening depression, neurologic exam was nonfocal, diagnosed with probable tension type headaches, CT of the head, sed rate, BMP and CBC were ordered.  Feeling more depressed lately, loss of his father in November, Prozac causing sexual side effects, he was recently changed to Wellbutrin.  CT and sed rate were unremarkable, Flexeril and meloxicam were  tried, he was also complaining of chest pain which was atypical.  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  Headaches started in February and they have been stable. It can be anywhere in the head. Last night it was like a migraine, a dull pain, stays localized today it is on the left parietal area. No hx of migraines. No significant hx of migraines in the family. Daily headaches, he wakes up with the headache and he is not sleeping well, in the setting of stress, he snores, he doesn't sleep well but he declines sleep eval. When the headaches is severe it can be migrainous and he does a hx of episodic migraines, when they are migraines like last night he can sound sensitivity, some light sensitivity, nausea, no vomiting. He had a few concussions in the past. No aura prior to the migraines or headaches. Headaches are daily, all day, he has tried tylenol, caffeine hasn't helped. Migraines can last at least 4 hours, sleep helps. No associated dizziness, tingling. Mobic does not help. Flexeril makes him tired. 3-4 in pain on average, last night was 8/10. He has chronic neck pain and shoulder pain. No other focal neurologic deficits, associated symptoms, inciting events or modifiable factors.   Reviewed notes, labs and imaging from outside physicians, which showed:   CT of the head January 01, 2020 showed  No acute intracranial abnormalities including mass lesion or mass effect, hydrocephalus, extra-axial fluid collection, midline shift, hemorrhage, or acute infarction, large ischemic events (personally reviewed images)   REVIEW OF SYSTEMS: Out of a complete 14 system review of symptoms, the patient complains only of the following symptoms, headaches, insomnia, restless sleep, attention deficit and all other reviewed  systems are negative.  ALLERGIES: No Known Allergies  HOME MEDICATIONS: Outpatient Medications Prior to Visit  Medication Sig Dispense Refill  . amphetamine-dextroamphetamine (ADDERALL) 10 MG tablet Take 2 tablets by mouth in AM, and 1 tablet by mouth in PM as needed. 180 tablet 0  . sildenafil (REVATIO) 20 MG tablet TAKE 3 TO 4 TABLETS BY MOUTH AT BEDTIME AS NEEDED 60 tablet 4  . buPROPion (WELLBUTRIN SR) 150 MG 12 hr tablet Take 1 tablet (150 mg total) by mouth 2 (two) times daily. 60 tablet 4  . cyclobenzaprine (FLEXERIL) 10 MG tablet Take 1 tablet (10 mg total) by mouth 2 (two) times daily as needed for muscle spasms. 60 tablet 0  . meloxicam (MOBIC) 15 MG tablet Take 1 tablet (15 mg total) by mouth daily as needed for pain. 90 tablet 0  . methylPREDNISolone (MEDROL DOSEPAK) 4 MG TBPK tablet Take all pills in the morning with food for 6 days. 6-5-4-3-2-1 21 tablet 0  . nortriptyline (PAMELOR) 25 MG capsule Take 1 capsule (25 mg total) by mouth at bedtime. 30 capsule 3  . rizatriptan (MAXALT-MLT) 10 MG disintegrating tablet Take 1 tablet (10 mg total) by mouth as needed. May repeat once in 2 hours if needed. For headache or migraine. 9 tablet 11   No facility-administered medications prior to visit.    PAST MEDICAL HISTORY: Past Medical History:  Diagnosis Date  . Adult ADHD 2017   [place], predominantly inattentive presentation  . Allergies    cats, hay  . Attention deficit hyperactivity disorder (ADHD) 04/24/2016  . Depressive personality disorder 2017   [place]  . Erectile dysfunction     PAST SURGICAL HISTORY: Past Surgical History:  Procedure Laterality Date  . HERNIA REPAIR     umbilical, at age 89 y/o  . KNEE CARTILAGE SURGERY Bilateral 09/20/2018    FAMILY HISTORY: Family History  Problem Relation Age of Onset  . CAD Other        GF  . Tongue cancer Father   . Headache Brother   . Colon cancer Neg Hx   . Prostate cancer Neg Hx    . Diabetes Neg Hx     SOCIAL HISTORY: Social History   Socioeconomic History  . Marital status: Married    Spouse name: Not on file  . Number of children: 2  . Years of education: Not on file  . Highest education level: Bachelor's degree (e.g., BA, AB, BS)  Occupational History  . Occupation: IT    Employer: [employer]  Tobacco Use  . Smoking status: Former Smoker    Years: 3.00    Types: Cigarettes, Cigars  . Smokeless tobacco: Never Used  . Tobacco comment: smoked while in college   Vaping Use  . Vaping Use: Never used  Substance and Sexual Activity  . Alcohol use: Yes    Comment: socially   . Drug use: No  . Sexual activity: Not on file  Other Topics Concern  . Not on file  Social History Narrative   Household-- pt , wife ,2009, 2006      Lives at home with wife &  children   Right handed   Caffeine: 1 cup/day   Social Determinants of Health   Financial Resource Strain:   . Difficulty of Paying Living Expenses:   Food Insecurity:   . Worried About Charity fundraiser in the Last Year:   . Arboriculturist in the Last Year:   Transportation Needs:   . Film/video editor (Medical):   Marland Kitchen Lack of Transportation (Non-Medical):   Physical Activity:   . Days of Exercise per Week:   . Minutes of Exercise per Session:   Stress:   . Feeling of Stress :   Social Connections:   . Frequency of Communication with Friends and Family:   . Frequency of Social Gatherings with Friends and Family:   . Attends Religious Services:   . Active Member of Clubs or Organizations:   . Attends Archivist Meetings:   Marland Kitchen Marital Status:   Intimate Partner Violence:   . Fear of Current or Ex-Partner:   . Emotionally Abused:   Marland Kitchen Physically Abused:   . Sexually Abused:       PHYSICAL EXAM  Vitals:   04/29/20 1303  BP: 117/74  Pulse: (!) 101  Weight: 224 lb (101.6 kg)  Height: 5\' 11"  (1.803 m)   Body mass index is 31.24 kg/m.  Generalized: Well  developed, in no acute distress  Cardiology: normal rate and rhythm, no murmur noted Respiratory: clear to auscultation bilaterally  Neurological examination  Mentation: Alert oriented to time, place, history taking. Follows all commands speech and language fluent Cranial nerve II-XII: Pupils were equal round reactive to light. Extraocular movements were full, visual field were full Motor: The motor testing reveals 5 over 5 strength of all 4 extremities. Good symmetric motor tone is noted throughout.  Gait and station: Gait is normal.    DIAGNOSTIC DATA (LABS, IMAGING, TESTING - I reviewed patient records, labs, notes, testing and imaging myself where available.  No flowsheet data found.   Lab Results  Component Value Date   WBC 5.9 01/06/2020   HGB 14.1 01/06/2020   HCT 41.3 01/06/2020   MCV 88.7 01/06/2020   PLT 340.0 01/06/2020      Component Value Date/Time   NA 140 01/06/2020 0759   K 4.6 01/06/2020 0759   CL 104 01/06/2020 0759   CO2 28 01/06/2020 0759   GLUCOSE 104 (H) 01/06/2020 0759   BUN 13 01/06/2020 0759   CREATININE 1.04 01/06/2020 0759   CALCIUM 9.6 01/06/2020 0759   PROT 7.3 08/06/2019 1339   ALBUMIN 4.7 08/06/2019 1339   AST 18 08/06/2019 1339   ALT 22 08/06/2019 1339   ALKPHOS 66 08/06/2019 1339   BILITOT 0.6 08/06/2019 1339   GFRNONAA >90 01/15/2012 0605   GFRAA >90 01/15/2012 0605   Lab Results  Component Value Date   CHOL 203 (H) 08/06/2019   HDL 45.40 08/06/2019   LDLCALC 126 (H) 08/06/2019   LDLDIRECT 58.0 06/04/2018   TRIG 160.0 (H) 08/06/2019   CHOLHDL 4 08/06/2019   Lab Results  Component Value Date   HGBA1C 5.4 06/04/2018   No results found for: VITAMINB12 Lab Results  Component Value Date   TSH 1.750 03/02/2020       ASSESSMENT AND PLAN 44 y.o. year old male  has a past medical history of Adult ADHD (2017), Allergies, Attention deficit hyperactivity disorder (ADHD) (04/24/2016), Depressive personality disorder (2017), and  Erectile dysfunction. here with     ICD-10-CM   1. Migraine  without aura and without status migrainosus, not intractable  G43.009   2. Chronic tension-type headache, intractable  G44.221      Harvis continues to have daily headaches.  Migraine symptoms are infrequent.  He reports tolerating nortriptyline well but did not feel that it was very effective.  He feels that rizatriptan caused him to have gastritis.  We will discontinue rizatriptan.  I will start amitriptyline 25 mg at bedtime.  I am hopeful that this may help with daily headaches as well as concerns of restless sleep.  We have discussed concerns of sleep apnea.  I have encouraged him to consider a formal sleep evaluation.  He was encouraged to continue close follow-up with his primary care provider.  Healthy lifestyle habits with well-balanced diet and regular exercise encouraged.  Headache triggers reviewed.  Additional information provided in AVS.  He will follow-up in 6 months, sooner if needed.  He verbalizes understanding and agreement with this plan.    No orders of the defined types were placed in this encounter.    No orders of the defined types were placed in this encounter.     I spent 15 minutes with the patient. 50% of this time was spent counseling and educating patient on plan of care and medications.    Debbora Presto, FNP-C 04/29/2020, 1:06 PM [facility] [address], [place], [place] [address] [phone]  Made any corrections needed, and agree with history, physical, neuro exam,assessment and plan as stated.     Sarina Ill, MD [facility]

## 2020-05-05 ENCOUNTER — Encounter: Payer: Self-pay | Admitting: Internal Medicine

## 2020-05-05 ENCOUNTER — Other Ambulatory Visit: Payer: Self-pay

## 2020-05-05 ENCOUNTER — Telehealth (INDEPENDENT_AMBULATORY_CARE_PROVIDER_SITE_OTHER): Payer: BC Managed Care – PPO | Admitting: Internal Medicine

## 2020-05-05 ENCOUNTER — Other Ambulatory Visit: Payer: Self-pay | Admitting: Internal Medicine

## 2020-05-05 VITALS — Ht 71.0 in | Wt 215.0 lb

## 2020-05-05 DIAGNOSIS — F329 Major depressive disorder, single episode, unspecified: Secondary | ICD-10-CM

## 2020-05-05 DIAGNOSIS — F900 Attention-deficit hyperactivity disorder, predominantly inattentive type: Secondary | ICD-10-CM

## 2020-05-05 DIAGNOSIS — F32A Depression, unspecified: Secondary | ICD-10-CM

## 2020-05-05 MED ORDER — AMPHETAMINE-DEXTROAMPHETAMINE 20 MG PO TABS
20.0000 mg | ORAL_TABLET | Freq: Two times a day (BID) | ORAL | 0 refills | Status: DC
Start: 2020-05-05 — End: 2020-06-07

## 2020-05-05 NOTE — Progress Notes (Signed)
Subjective:    Patient ID: Carl Beltran, male    DOB: [birth year], 44 y.o.   MRN: [id number]  DOS:  05/05/2020 Type of visit - description: Virtual Visit via Video Note  I connected with the above patient  by a video enabled telemedicine application and verified that I am speaking with the correct person using two identifiers.   THIS ENCOUNTER IS A VIRTUAL VISIT DUE TO COVID-19 - PATIENT WAS NOT SEEN IN THE OFFICE. PATIENT HAS CONSENTED TO VIRTUAL VISIT / TELEMEDICINE VISIT   Location of patient: home  Location of provider: office  I discussed the limitations of evaluation and management by telemedicine and the availability of in person appointments. The patient expressed understanding and agreed to proceed.  Follow-up Since the last office visit, depression is better, self stopped medications. ADD: Good compliance with Adderall, does not feel his symptoms have been well controlled for a while.  Wonders about increase Adderall dose.    Review of Systems Appetite increased lately Anxiety depression: Currently controlled.  Past Medical History:  Diagnosis Date  . Adult ADHD 2017   [place], predominantly inattentive presentation  . Allergies    cats, hay  . Attention deficit hyperactivity disorder (ADHD) 04/24/2016  . Depressive personality disorder 2017   [place]  . Erectile dysfunction     Past Surgical History:  Procedure Laterality Date  . HERNIA REPAIR     umbilical, at age 27 y/o  . KNEE CARTILAGE SURGERY Bilateral 09/20/2018    Allergies as of 05/05/2020   No Known Allergies     Medication List       Accurate as of May 05, 2020 11:59 PM. If you have any questions, ask your nurse or doctor.        STOP taking these medications   amphetamine-dextroamphetamine 10 MG tablet Commonly known as: Adderall Replaced by: amphetamine-dextroamphetamine 20 MG tablet Stopped by: Kathlene November, MD     TAKE these medications    amitriptyline 25 MG tablet Commonly known as: ELAVIL Take 1 tablet (25 mg total) by mouth at bedtime.   amphetamine-dextroamphetamine 20 MG tablet Commonly known as: Adderall Take 1 tablet (20 mg total) by mouth 2 (two) times daily. Second dose by 12 noon or 1 PM Replaces: amphetamine-dextroamphetamine 10 MG tablet Started by: Kathlene November, MD   sildenafil 20 MG tablet Commonly known as: REVATIO TAKE 3 TO 4 TABLETS BY MOUTH AT BEDTIME AS NEEDED          Objective:   Physical Exam Ht 5\' 11"  (1.803 m)   Wt 215 lb (97.5 kg)   BMI 29.99 kg/m  This is a virtual video visit, he is alert oriented x3, no vital signs available today but I review the last 2 visits with neurology: BP is normal, heart rate slightly elevated at around 100.    Assessment    Assessment ADHD (confirmed by psychology 03-2016) Depression (dx by psych 03-2016) ED   PLAN: ADHD: Has been on Adderall 30 mg in the morning for a while, lately he feels symptoms are not well controlled, interestingly at the same time has noted his appetite has increased.  Wonders if we could up the dose of Adderall. We agreed on: Increase Adderall to 20 mg twice daily, second dose around noon or 1 PM. RF if the new dosing works, he will let me know. Depression: Since the last visit, he weaned himself off of bupropion and fluoxetine, found a counselor that is really  helping and he feels well emotionally. RTC 07-2020 CPX, will ask front desk to call and set up    I discussed the assessment and treatment plan with the patient. The patient was provided an opportunity to ask questions and all were answered. The patient agreed with the plan and demonstrated an understanding of the instructions.   The patient was advised to call back or seek an in-person evaluation if the symptoms worsen or if the condition fails to improve as anticipated.

## 2020-05-05 NOTE — Progress Notes (Signed)
Pre visit review using our clinic review tool, if applicable. No additional management support is needed unless otherwise documented below in the visit note. 

## 2020-05-06 NOTE — Assessment & Plan Note (Signed)
ADHD: Has been on Adderall 30 mg in the morning for a while, lately he feels symptoms are not well controlled, interestingly at the same time has noted his appetite has increased.  Wonders if we could up the dose of Adderall. We agreed on: Increase Adderall to 20 mg twice daily, second dose around noon or 1 PM. RF if the new dosing works, he will let me know. Depression: Since the last visit, he weaned himself off of bupropion and fluoxetine, found a counselor that is really helping and he feels well emotionally. RTC 07-2020 CPX, will ask front desk to call and set up

## 2020-06-07 ENCOUNTER — Encounter: Payer: Self-pay | Admitting: Internal Medicine

## 2020-06-07 ENCOUNTER — Telehealth: Payer: Self-pay | Admitting: Internal Medicine

## 2020-06-07 NOTE — Telephone Encounter (Signed)
Adderall refill.   Last OV: 05/05/2020 Last Fill: 05/05/2020 #60 and 0RF UDS: 08/06/2019 Low risk

## 2020-06-08 MED ORDER — AMPHETAMINE-DEXTROAMPHETAMINE 20 MG PO TABS: 20.0000 mg | ORAL_TABLET | Freq: Two times a day (BID) | ORAL | 0 refills | Status: DC

## 2020-06-08 NOTE — Telephone Encounter (Signed)
PDMP okay, Rx sent 

## 2020-06-21 ENCOUNTER — Other Ambulatory Visit: Payer: Self-pay | Admitting: Internal Medicine

## 2020-07-14 ENCOUNTER — Telehealth: Payer: Self-pay | Admitting: Internal Medicine

## 2020-07-14 ENCOUNTER — Encounter: Payer: Self-pay | Admitting: Internal Medicine

## 2020-07-14 MED ORDER — AMPHETAMINE-DEXTROAMPHETAMINE 20 MG PO TABS: 20.0000 mg | ORAL_TABLET | Freq: Two times a day (BID) | ORAL | 0 refills | Status: DC

## 2020-07-14 NOTE — Telephone Encounter (Signed)
PDMP okay, RF sent 

## 2020-07-14 NOTE — Telephone Encounter (Signed)
Adderall refill.   Last OV: 05/05/2020 Last Fill: 06/08/2020 #60 and 0RF Pt sig: 1 tab bid UDS: 08/06/2019 Low risk

## 2020-07-15 ENCOUNTER — Other Ambulatory Visit: Payer: Self-pay

## 2020-07-15 ENCOUNTER — Encounter: Payer: Self-pay | Admitting: Internal Medicine

## 2020-07-15 ENCOUNTER — Ambulatory Visit (INDEPENDENT_AMBULATORY_CARE_PROVIDER_SITE_OTHER): Payer: BC Managed Care – PPO | Admitting: Internal Medicine

## 2020-07-15 VITALS — BP 128/87 | HR 94 | Temp 98.4°F | Resp 16 | Ht 71.0 in | Wt 218.1 lb

## 2020-07-15 DIAGNOSIS — Z1159 Encounter for screening for other viral diseases: Secondary | ICD-10-CM

## 2020-07-15 DIAGNOSIS — Z79899 Other long term (current) drug therapy: Secondary | ICD-10-CM

## 2020-07-15 DIAGNOSIS — E785 Hyperlipidemia, unspecified: Secondary | ICD-10-CM | POA: Diagnosis not present

## 2020-07-15 DIAGNOSIS — F900 Attention-deficit hyperactivity disorder, predominantly inattentive type: Secondary | ICD-10-CM

## 2020-07-15 DIAGNOSIS — Z0000 Encounter for general adult medical examination without abnormal findings: Secondary | ICD-10-CM | POA: Diagnosis not present

## 2020-07-15 NOTE — Progress Notes (Signed)
   Subjective:    Patient ID: Carl Beltran, male    DOB: September 30, 1976, 44 y.o.   MRN: [id number]  DOS:  07/15/2020 Type of visit - description: CPX Since the last office visit he is doing well. He discontinue Elavil and is currently only taking Adderall.   Review of Systems    Other than above, a 14 point review of systems is negative   Past Medical History:  Diagnosis Date  . Adult ADHD 2017   [place], predominantly inattentive presentation  . Allergies    cats, hay  . Attention deficit hyperactivity disorder (ADHD) 04/24/2016  . Depressive personality disorder 2017   [place]  . Erectile dysfunction     Past Surgical History:  Procedure Laterality Date  . HERNIA REPAIR     umbilical, at age 41 y/o  . KNEE CARTILAGE SURGERY Bilateral 09/20/2018    Allergies as of 07/15/2020   No Known Allergies     Medication List       Accurate as of July 15, 2020 11:59 PM. If you have any questions, ask your nurse or doctor.        STOP taking these medications   amitriptyline 25 MG tablet Commonly known as: ELAVIL Stopped by: Kathlene November, MD     TAKE these medications   amphetamine-dextroamphetamine 20 MG tablet Commonly known as: Adderall Take 1 tablet (20 mg total) by mouth 2 (two) times daily. Second dose by 12 noon or 1 PM   sildenafil 20 MG tablet Commonly known as: REVATIO TAKE 3 TO 4 TABLETS BY MOUTH AT BEDTIME AS NEEDED          Objective:   Physical Exam BP 128/87 (BP Location: Left Arm, Patient Position: Sitting, Cuff Size: Normal)   Pulse 94   Temp 98.4 F (36.9 C) (Oral)   Resp 16   Ht 5\' 11"  (1.803 m)   Wt 218 lb 2 oz (98.9 kg)   SpO2 98%   BMI 30.42 kg/m  General: Well developed, NAD, BMI noted Neck: No  thyromegaly  HEENT:  Normocephalic . Face symmetric, atraumatic Lungs:  CTA B Normal respiratory effort, no intercostal retractions, no accessory muscle use. Heart: RRR,  no murmur.  Abdomen:  Not  distended, soft, non-tender. No rebound or rigidity.   Lower extremities: no pretibial edema bilaterally  Skin: Exposed areas without rash. Not pale. Not jaundice Neurologic:  alert & oriented X3.  Speech normal, gait appropriate for age and unassisted Strength symmetric and appropriate for age.  Psych: Cognition and judgment appear intact.  Cooperative with normal attention span and concentration.  Behavior appropriate. No anxious or depressed appearing.     Assessment     Assessment ADHD (confirmed by psychology 03-2016) Depression (dx by psych 03-2016) ED   PLAN: Here for CPX ADHD: Currently controlled with Adderall, check a UDS, RF as needed Depression: Feeling great, not taking any medications at this point Headaches: Saw neurology, 03/02/2020, prescribed amitriptyline, as well as Maxalt.  They felt the medications did not help and actually Maxalt made the headache worse.  Currently on no medications, headache is better. He declined a sleep study offered by neurology. RTC 8 months    This visit occurred during the SARS-CoV-2 public health emergency.  Safety protocols were in place, including screening questions prior to the visit, additional usage of staff PPE, and extensive cleaning of exam room while observing appropriate contact time as indicated for disinfecting solutions.

## 2020-07-15 NOTE — Progress Notes (Signed)
Pre visit review using our clinic review tool, if applicable. No additional management support is needed unless otherwise documented below in the visit note. 

## 2020-07-15 NOTE — Patient Instructions (Signed)
   GO TO THE LAB : Get the blood work     GO TO THE FRONT DESK, PLEASE SCHEDULE YOUR APPOINTMENTS Come back for a checkup in 8 months 

## 2020-07-16 ENCOUNTER — Encounter: Payer: Self-pay | Admitting: Internal Medicine

## 2020-07-16 LAB — HEPATITIS C ANTIBODY
Hepatitis C Ab: NONREACTIVE
SIGNAL TO CUT-OFF: 0.01 (ref <1.00)

## 2020-07-16 LAB — LIPID PANEL
Cholesterol: 163 mg/dL (ref <200)
HDL: 41 mg/dL (ref >=40)
LDL Cholesterol (Calc): 94 mg/dL (calc)
Non-HDL Cholesterol (Calc): 122 mg/dL (calc) (ref <130)
Total CHOL/HDL Ratio: 4 (calc) (ref <5.0)
Triglycerides: 180 mg/dL — ABNORMAL HIGH (ref <150)

## 2020-07-16 NOTE — Assessment & Plan Note (Signed)
Here for CPX ADHD: Currently controlled with Adderall, check a UDS, RF as needed Depression: Feeling great, not taking any medications at this point Headaches: Saw neurology, 03/02/2020, prescribed amitriptyline, as well as Maxalt.  They felt the medications did not help and actually Maxalt made the headache worse.  Currently on no medications, headache is better. He declined a sleep study offered by neurology. RTC 8 months

## 2020-07-16 NOTE — Assessment & Plan Note (Signed)
-  Td 2015 - s/p C-19 vaccines  - flu shot rec q year -Had a Colonoscopy in 2008 for rectal bleeding, now w/ no sx  -Diet and exercise discussed  -LABS : CMP, FLP, CBC, UDS

## 2020-07-17 LAB — DRUG MONITORING, PANEL 8 WITH CONFIRMATION, URINE
6 Acetylmorphine: NEGATIVE ng/mL (ref <10)
Alcohol Metabolites: POSITIVE ng/mL — AB
Amphetamine: >15000 ng/mL — ABNORMAL HIGH (ref <250)
Amphetamines: POSITIVE ng/mL — AB (ref <500)
Benzodiazepines: NEGATIVE ng/mL (ref <100)
Buprenorphine, Urine: NEGATIVE ng/mL (ref <5)
Cocaine Metabolite: NEGATIVE ng/mL (ref <150)
Creatinine: 227 mg/dL
Ethyl Glucuronide (ETG): 6254 ng/mL — ABNORMAL HIGH (ref <500)
Ethyl Sulfate (ETS): 1028 ng/mL — ABNORMAL HIGH (ref <100)
MDMA: NEGATIVE ng/mL (ref <500)
Marijuana Metabolite: NEGATIVE ng/mL (ref <20)
Methamphetamine: NEGATIVE ng/mL (ref <250)
Opiates: NEGATIVE ng/mL (ref <100)
Oxidant: NEGATIVE ug/mL
Oxycodone: NEGATIVE ng/mL (ref <100)
pH: 5.4 (ref 4.5–9.0)

## 2020-07-17 LAB — DM TEMPLATE

## 2020-07-19 ENCOUNTER — Encounter: Payer: BC Managed Care – PPO | Admitting: Internal Medicine

## 2020-08-14 ENCOUNTER — Telehealth: Payer: Self-pay | Admitting: Internal Medicine

## 2020-08-14 ENCOUNTER — Encounter: Payer: Self-pay | Admitting: Internal Medicine

## 2020-08-16 MED ORDER — AMPHETAMINE-DEXTROAMPHETAMINE 20 MG PO TABS
20.0000 mg | ORAL_TABLET | Freq: Two times a day (BID) | ORAL | 0 refills | Status: DC
Start: 2020-08-16 — End: 2020-08-16

## 2020-08-16 MED ORDER — AMPHETAMINE-DEXTROAMPHETAMINE 20 MG PO TABS
20.0000 mg | ORAL_TABLET | Freq: Two times a day (BID) | ORAL | 0 refills | Status: DC
Start: 2020-08-16 — End: 2020-09-17

## 2020-08-16 NOTE — Telephone Encounter (Signed)
PDMP okay, UDS low risk, 2 prescriptions sent

## 2020-08-16 NOTE — Telephone Encounter (Signed)
Last written:07/14/2020 Last ov:07/19/2020 Next ov:11/09/2019 Contract:05/16/16 UDS:07/15/20

## 2020-08-16 NOTE — Telephone Encounter (Signed)
Requesting: Adderall 20mg  Contract: 02/27/2018 UDS: 07/15/2020  Last Visit: 07/15/2020 Next Visit: None scheduled Last Refill: 07/14/2020 #60 and 0RF Pt sig: 1 tab bid   Please Advise

## 2020-09-03 ENCOUNTER — Other Ambulatory Visit (HOSPITAL_BASED_OUTPATIENT_CLINIC_OR_DEPARTMENT_OTHER): Payer: Self-pay | Admitting: Internal Medicine

## 2020-09-03 MED FILL — FLUARIX QUADRIVALENT 0.5 ML: 0.5 | 1 days supply | Qty: 1 | Fill #0

## 2020-09-17 ENCOUNTER — Other Ambulatory Visit: Payer: Self-pay | Admitting: Internal Medicine

## 2020-09-17 ENCOUNTER — Encounter: Payer: Self-pay | Admitting: Internal Medicine

## 2020-09-17 MED ORDER — AMPHETAMINE-DEXTROAMPHETAMINE 20 MG PO TABS
20.0000 mg | ORAL_TABLET | Freq: Two times a day (BID) | ORAL | 0 refills | Status: DC
Start: 2020-09-17 — End: 2020-10-19

## 2020-09-17 NOTE — Telephone Encounter (Signed)
Requesting: Adderall 20mg  Contract: 02/27/2018 UDS: 07/15/2020  Last Visit: 07/15/20 Next Visit: None scheduled Last Refill:08/16/2020 #60 and 0rf   Please Advise

## 2020-10-19 ENCOUNTER — Encounter: Payer: Self-pay | Admitting: Internal Medicine

## 2020-10-19 ENCOUNTER — Telehealth: Payer: Self-pay | Admitting: Family Medicine

## 2020-10-19 MED ORDER — AMPHETAMINE-DEXTROAMPHETAMINE 20 MG PO TABS
20.0000 mg | ORAL_TABLET | Freq: Two times a day (BID) | ORAL | 0 refills | Status: DC
Start: 2020-10-19 — End: 2021-01-23

## 2020-10-19 NOTE — Telephone Encounter (Signed)
I sent 90 days.  PDMP okay

## 2020-10-19 NOTE — Telephone Encounter (Signed)
Requesting: Adderall 20mg  Contract: 02/27/2018 UDS: 07/15/2020 Last Visit: 07/15/2020 Next Visit: None Last Refill: 09/17/2020 #60 and 0RF  Pt requesting 90 day supply because he travels a lot for work.  Please Advise

## 2020-11-08 ENCOUNTER — Ambulatory Visit: Payer: BC Managed Care – PPO | Admitting: Family Medicine

## 2020-11-15 ENCOUNTER — Ambulatory Visit: Payer: BC Managed Care – PPO | Admitting: Family Medicine

## 2020-12-15 ENCOUNTER — Other Ambulatory Visit: Payer: Self-pay

## 2020-12-15 ENCOUNTER — Encounter: Payer: Self-pay | Admitting: Internal Medicine

## 2020-12-15 ENCOUNTER — Ambulatory Visit: Payer: BC Managed Care – PPO | Admitting: Internal Medicine

## 2020-12-15 VITALS — BP 116/81 | HR 109 | Temp 98.3°F | Resp 16 | Ht 71.0 in | Wt 222.2 lb

## 2020-12-15 DIAGNOSIS — R000 Tachycardia, unspecified: Secondary | ICD-10-CM

## 2020-12-15 DIAGNOSIS — R002 Palpitations: Secondary | ICD-10-CM | POA: Diagnosis not present

## 2020-12-15 DIAGNOSIS — F32A Depression, unspecified: Secondary | ICD-10-CM

## 2020-12-15 DIAGNOSIS — R519 Headache, unspecified: Secondary | ICD-10-CM | POA: Diagnosis not present

## 2020-12-15 LAB — COMPREHENSIVE METABOLIC PANEL
ALT: 23 U/L (ref 0–53)
AST: 19 U/L (ref 0–37)
Albumin: 4.8 g/dL (ref 3.5–5.2)
Alkaline Phosphatase: 70 U/L (ref 39–117)
BUN: 13 mg/dL (ref 6–23)
CO2: 29 mEq/L (ref 19–32)
Calcium: 9.9 mg/dL (ref 8.4–10.5)
Chloride: 103 mEq/L (ref 96–112)
Creatinine, Ser: 1.03 mg/dL (ref 0.40–1.50)
GFR: 88.31 mL/min (ref >60.00)
Glucose, Bld: 123 mg/dL — ABNORMAL HIGH (ref 70–99)
Potassium: 4.5 mEq/L (ref 3.5–5.1)
Sodium: 140 mEq/L (ref 135–145)
Total Bilirubin: 0.8 mg/dL (ref 0.2–1.2)
Total Protein: 7.5 g/dL (ref 6.0–8.3)

## 2020-12-15 LAB — CBC WITH DIFFERENTIAL/PLATELET
Basophils Absolute: 0.1 10*3/uL (ref 0.0–0.1)
Basophils Relative: 0.8 % (ref 0.0–3.0)
Eosinophils Absolute: 0.1 10*3/uL (ref 0.0–0.7)
Eosinophils Relative: 1.4 % (ref 0.0–5.0)
HCT: 44.6 % (ref 39.0–52.0)
Hemoglobin: 15.3 g/dL (ref 13.0–17.0)
Lymphocytes Relative: 31.3 % (ref 12.0–46.0)
Lymphs Abs: 2.1 10*3/uL (ref 0.7–4.0)
MCHC: 34.2 g/dL (ref 30.0–36.0)
MCV: 86.8 fl (ref 78.0–100.0)
Monocytes Absolute: 0.5 10*3/uL (ref 0.1–1.0)
Monocytes Relative: 7 % (ref 3.0–12.0)
Neutro Abs: 4 10*3/uL (ref 1.4–7.7)
Neutrophils Relative %: 59.5 % (ref 43.0–77.0)
Platelets: 347 10*3/uL (ref 150.0–400.0)
RBC: 5.15 Mil/uL (ref 4.22–5.81)
RDW: 12.7 % (ref 11.5–15.5)
WBC: 6.8 10*3/uL (ref 4.0–10.5)

## 2020-12-15 LAB — TSH: TSH: 1.52 u[IU]/mL (ref 0.35–4.50)

## 2020-12-15 MED ORDER — BUPROPION HCL ER (XL) 150 MG PO TB24: 150.0000 mg | ORAL_TABLET | Freq: Every day | ORAL | 1 refills | Status: DC

## 2020-12-15 NOTE — Patient Instructions (Addendum)
Start taking Wellbutrin XL 150 mg: 1 tablet daily in the morning. It should help gradually. If in 3 weeks you are not feeling better please let me know   GO TO THE LAB : Get the blood work     [place], [place] back for a checkup in 6 weeks

## 2020-12-15 NOTE — Progress Notes (Unsigned)
Pre visit review using our clinic review tool, if applicable. No additional management support is needed unless otherwise documented below in the visit note. 

## 2020-12-15 NOTE — Progress Notes (Signed)
Subjective:    Patient ID: Carl Beltran, male    DOB: 1975/11/17, 45 y.o.   MRN: [id number]  DOS:  12/15/2020 Type of visit - description: Acute, several concerns  For a while has noted that his apple watch reports his heart rate goes up to the 125's, this happens 2 to 3 times a day, episodes lasts 5 to 7 minutes.    Is not associated with chest pain, difficulty breathing, diaphoresis.  Some palpitations.  Stress has increased significantly at work, depression has returned, mild anxiety as well.  Sleeping is not great.  No suicidal ideas.  Headaches have returned, 3-4 times a week, last at least 1 hour and sometimes more. It is typically left-sided, no associated with eye tearing.    Review of Systems See above   Past Medical History:  Diagnosis Date  . Adult ADHD 2017   [place], predominantly inattentive presentation  . Allergies    cats, hay  . Attention deficit hyperactivity disorder (ADHD) 04/24/2016  . Depressive personality disorder 2017   [place]  . Erectile dysfunction     Past Surgical History:  Procedure Laterality Date  . HERNIA REPAIR     umbilical, at age 65 y/o  . KNEE CARTILAGE SURGERY Bilateral 09/20/2018    Allergies as of 12/15/2020   No Known Allergies     Medication List       Accurate as of December 15, 2020  9:25 AM. If you have any questions, ask your nurse or doctor.        amphetamine-dextroamphetamine 20 MG tablet Commonly known as: Adderall Take 1 tablet (20 mg total) by mouth 2 (two) times daily. Second dose by 12 noon or 1 PM   sildenafil 20 MG tablet Commonly known as: REVATIO TAKE 3 TO 4 TABLETS BY MOUTH AT BEDTIME AS NEEDED          Objective:   Physical Exam BP 116/81 (BP Location: Left Arm, Patient Position: Sitting, Cuff Size: Normal)   Pulse (!) 109   Temp 98.3 F (36.8 C) (Oral)   Resp 16   Ht 5\' 11"  (1.803 m)   Wt 222 lb 4 oz (100.8 kg)   SpO2 100%   BMI 31.00 kg/m  General:    Well developed, NAD, BMI noted. HEENT:  Normocephalic . Face symmetric, atraumatic Lungs:  CTA B Normal respiratory effort, no intercostal retractions, no accessory muscle use. Heart: RRR,  no murmur.  Lower extremities: no pretibial edema bilaterally  Skin: Not pale. Not jaundice Neurologic:  alert & oriented X3.  Speech normal, gait appropriate for age and unassisted Psych--  Cognition and judgment appear intact.  Cooperative with normal attention span and concentration.  Behavior appropriate. No anxious or depressed appearing.      Assessment    Assessment ADHD (confirmed by psychology 03-2016) Depression (dx by psych 03-2016) ED   PLAN: Palpitations: Tachycardia per his apple watch,  going on for a while, 2  to 3 episodes a day, no associated with symptoms except palpitations . Sxs happen typically at rest, sitting at work, sometimes associated with a spike of a stress during his day. EKG today: Sinus tachycardia, no acute changes. Plan: CMP, CBC, TSH, 48 Holter monitor. Depression, some anxiety: sxs have returned, increasing stress at work, currently on no medications, PHQ-9: 12, previously Lexapro helped for a while, subsequently was switched to fluoxetine and eventually to Wellbutrin..  Counseling help a lot. Plan: Offered meds, elected return to  Wellbutrin; already has an appointment to see a counselor  Headache: Symptoms has resurfaced, he feels they are similar to previous headaches, was evaluated by neurology on 02-2020, Rx amitriptyline and Maxalt, Maxalt did not work. Currently take Tylenol for it, 3-4 times a week. Plan: Work on depression and anxiety, reassess on RTC RTC 6 weeks        This visit occurred during the SARS-CoV-2 public health emergency.  Safety protocols were in place, including screening questions prior to the visit, additional usage of staff PPE, and extensive cleaning of exam room while observing appropriate contact time as indicated for  disinfecting solutions.

## 2020-12-16 NOTE — Assessment & Plan Note (Signed)
Palpitations: Tachycardia per his apple watch,  going on for a while, 2  to 3 episodes a day, no associated with symptoms except palpitations . Sxs happen typically at rest, sitting at work, sometimes associated with a spike of a stress during his day. EKG today: Sinus tachycardia, no acute changes. Plan: CMP, CBC, TSH, 48 Holter monitor. Depression, some anxiety: sxs have returned, increasing stress at work, currently on no medications, PHQ-9: 12, previously Lexapro helped for a while, subsequently was switched to fluoxetine and eventually to Wellbutrin..  Counseling help a lot. Plan: Offered meds, elected return to Wellbutrin; already has an appointment to see a counselor  Headache: Symptoms has resurfaced, he feels they are similar to previous headaches, was evaluated by neurology on 02-2020, Rx amitriptyline and Maxalt, Maxalt did not work. Currently take Tylenol for it, 3-4 times a week. Plan: Work on depression and anxiety, reassess on RTC RTC 6 weeks

## 2020-12-17 ENCOUNTER — Encounter: Payer: Self-pay | Admitting: Internal Medicine

## 2020-12-23 ENCOUNTER — Other Ambulatory Visit: Payer: Self-pay | Admitting: Internal Medicine

## 2020-12-23 ENCOUNTER — Ambulatory Visit (INDEPENDENT_AMBULATORY_CARE_PROVIDER_SITE_OTHER): Payer: BC Managed Care – PPO

## 2020-12-23 DIAGNOSIS — R002 Palpitations: Secondary | ICD-10-CM

## 2020-12-23 DIAGNOSIS — R000 Tachycardia, unspecified: Secondary | ICD-10-CM | POA: Diagnosis not present

## 2020-12-23 NOTE — Telephone Encounter (Signed)
Requesting: Adderall XR 20mg  Contract: 12/15/2020 UDS: 07/15/2020 Last Visit: 12/15/2020 Next Visit: 01/26/2021 Last Refill: 10/19/2020 #180 and 0RF Pt sig: 1 tab bid  Please Advise

## 2020-12-24 NOTE — Telephone Encounter (Signed)
Per PDMP, received a 90-day supply on 10/19/2020.

## 2021-01-11 DIAGNOSIS — R002 Palpitations: Secondary | ICD-10-CM | POA: Diagnosis not present

## 2021-01-11 DIAGNOSIS — R000 Tachycardia, unspecified: Secondary | ICD-10-CM | POA: Diagnosis not present

## 2021-01-17 ENCOUNTER — Other Ambulatory Visit: Payer: Self-pay | Admitting: Internal Medicine

## 2021-01-23 ENCOUNTER — Telehealth: Payer: Self-pay | Admitting: Internal Medicine

## 2021-01-24 MED ORDER — AMPHETAMINE-DEXTROAMPHETAMINE 20 MG PO TABS: 20.0000 mg | ORAL_TABLET | Freq: Two times a day (BID) | ORAL | 0 refills | Status: DC

## 2021-01-24 NOTE — Telephone Encounter (Signed)
Requesting: Adderall 20mg  Contract: 12/16/2019 UDS: 07/15/2020 Last Visit: 12/15/2020 Next Visit: 01/27/2021 Last Refill: 10/19/2020 #180 and 0RF Pt sig: 1 tab bid  Please Advise

## 2021-01-24 NOTE — Telephone Encounter (Signed)
PDMP okay, Rx sent 

## 2021-01-26 ENCOUNTER — Ambulatory Visit: Payer: BC Managed Care – PPO | Admitting: Internal Medicine

## 2021-01-27 ENCOUNTER — Other Ambulatory Visit: Payer: Self-pay

## 2021-01-27 ENCOUNTER — Ambulatory Visit: Payer: BC Managed Care – PPO | Admitting: Internal Medicine

## 2021-01-27 ENCOUNTER — Encounter: Payer: Self-pay | Admitting: Internal Medicine

## 2021-01-27 VITALS — BP 136/78 | HR 105 | Temp 98.4°F | Resp 16 | Ht 71.0 in | Wt 220.0 lb

## 2021-01-27 DIAGNOSIS — R002 Palpitations: Secondary | ICD-10-CM | POA: Diagnosis not present

## 2021-01-27 DIAGNOSIS — F32A Depression, unspecified: Secondary | ICD-10-CM

## 2021-01-27 MED ORDER — BUPROPION HCL ER (XL) 300 MG PO TB24: 300.0000 mg | ORAL_TABLET | Freq: Every day | ORAL | 4 refills | Status: DC

## 2021-01-27 NOTE — Progress Notes (Signed)
Subjective:    Patient ID: Carl Beltran, male    DOB: Jul 15, 1976, 45 y.o.   MRN: [id number]  DOS:  01/27/2021 Type of visit - description: Follow-up  Here to discuss  palpitations, depression/anxiety and headaches.  Fortunately headaches are better.  Still has palpitations from time to time, most noticeable immediately after sexual activity. His wrist  watch track his heart rate, average 110.  Also has developed a left-sided chest pressure, it is random, sometimes at rest, sometimes when he is playing golf.  Wellbutrin has helped to some extent, increased dose?   Review of Systems See above   Past Medical History:  Diagnosis Date  . Adult ADHD 2017   [place], predominantly inattentive presentation  . Allergies    cats, hay  . Attention deficit hyperactivity disorder (ADHD) 04/24/2016  . Depressive personality disorder 2017   [place]  . Erectile dysfunction     Past Surgical History:  Procedure Laterality Date  . HERNIA REPAIR     umbilical, at age 46 y/o  . KNEE CARTILAGE SURGERY Bilateral 09/20/2018    Allergies as of 01/27/2021   No Known Allergies     Medication List       Accurate as of January 27, 2021  9:42 AM. If you have any questions, ask your nurse or doctor.        amphetamine-dextroamphetamine 20 MG tablet Commonly known as: Adderall Take 1 tablet (20 mg total) by mouth 2 (two) times daily. Second dose by 12 noon or 1 PM   buPROPion 150 MG 24 hr tablet Commonly known as: WELLBUTRIN XL Take 1 tablet (150 mg total) by mouth daily.   sildenafil 20 MG tablet Commonly known as: REVATIO TAKE 3 TO 4 TABLETS BY MOUTH AT BEDTIME AS NEEDED          Objective:   Physical Exam BP 136/78 (BP Location: Left Arm, Patient Position: Sitting, Cuff Size: Normal)   Pulse (!) 105   Temp 98.4 F (36.9 C) (Oral)   Resp 16   Ht 5\' 11"  (1.803 m)   Wt 220 lb (99.8 kg)   SpO2 98%   BMI 30.68 kg/m  General:   Well  developed, NAD, BMI noted. HEENT:  Normocephalic . Face symmetric, atraumatic Lungs:  CTA B Normal respiratory effort, no intercostal retractions, no accessory muscle use. Heart: RRR,  no murmur.  Lower extremities: no pretibial edema bilaterally  Skin: Not pale. Not jaundice Neurologic:  alert & oriented X3.  Speech normal, gait appropriate for age and unassisted Psych--  Cognition and judgment appear intact.  Cooperative with normal attention span and concentration.  Behavior appropriate. He does not seem to be depressed or anxious today.     Assessment     Assessment ADHD (confirmed by psychology 03-2016) Depression (dx by psych 03-2016) ED Covid infex 09-2019   PLAN: Palpitations: See last visit, labs okay  3 -day Zio patch:  HR 48 - 143, average 93 bpm. No sustained arrhythmias. Rare supraventricular and ventricular ectopy. Elevated awake hour heart rates with overall good heart rate variability. Pt continue with palpitations, heart rate on average 110, he also has developed atypical chest pain.  He is very concerned about his heart, plan: Refer to cardiology. ADD: Patient is aware that Adderall can increase his heart rate, he is currently taking 20 mg in the morning and very rarely needs a second dose.  We discussed decrease the dose to 10 mg daily but he  declines for now. Depression: Started Wellbutrin, feeling somewhat better but thinks a higher dose will help, increase to Wellbutrin XL 300 mg daily.  He sees a Social worker. 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  Encouraged to balance pro/cons of having such stressful job. Headaches: See last visit, improved. RTC 3 months   This visit occurred during the SARS-CoV-2 public health emergency.  Safety protocols were in place, including screening questions prior to the visit, additional usage of staff PPE, and extensive cleaning of exam room while observing appropriate contact time as indicated for disinfecting  solutions.

## 2021-01-27 NOTE — Assessment & Plan Note (Signed)
Palpitations: See last visit, labs okay  3 -day Zio patch:  HR 48 - 143, average 93 bpm. No sustained arrhythmias. Rare supraventricular and ventricular ectopy. Elevated awake hour heart rates with overall good heart rate variability. Pt continue with palpitations, heart rate on average 110, he also has developed atypical chest pain.  He is very concerned about his heart, plan: Refer to cardiology. ADD: Patient is aware that Adderall can increase his heart rate, he is currently taking 20 mg in the morning and very rarely needs a second dose.  We discussed decrease the dose to 10 mg daily but he declines for now. Depression: Started Wellbutrin, feeling somewhat better but thinks a higher dose will help, increase to Wellbutrin XL 300 mg daily.  He sees a Social worker. 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  Encouraged to balance pro/cons of having such stressful job. Headaches: See last visit, improved. RTC 3 months

## 2021-01-27 NOTE — Patient Instructions (Signed)
We are referring you to cardiology.  If you are not contacted in the next several days please let us know   [place], [place] back for a checkup in 3 months

## 2021-01-30 NOTE — Progress Notes (Signed)
Cardiology Office Note:    Date:  01/31/2021   ID:  Carl Beltran, DOB 26-Oct-1976, MRN [id number]  PCP:  Colon Branch, MD  Cardiologist:  Shirlee More, MD   Referring MD: Colon Branch, MD  ASSESSMENT:    1. Palpitations   2. Chest discomfort    PLAN:    In order of problems listed above:  1. More than palpitation he gets warnings that he has had a resting sinus tachycardia I reviewed his monitor with him and I think were seen as an effect of his Adderall as opposed to arrhythmia.  This could also be influenced by previous COVID-19 at times associated with autonomic dysfunction afterwards however it tends to occur in more severe cases.  At this time I did offer him reassurance. 2. Further evaluation.  Ideally I think he be best served with a cardiac CTA however he did to be off of his Adderall for several weeks and he is hesitant to do that.  We will plan to do a stress Myoview and an echocardiogram as he is concerned about the possibility could have cardiomyopathy.  Next appointment 6 weeks   Medication Adjustments/Labs and Tests Ordered: Current medicines are reviewed at length with the patient today.  Concerns regarding medicines are outlined above.  No orders of the defined types were placed in this encounter.  No orders of the defined types were placed in this encounter.    Chief complaint my heart is rapid from the apple watch and I get chest pressure  History of Present Illness:    Carl Beltran is a 45 y.o. male who is being seen today for the evaluation of palpitation and chest pain at the request of Colon Branch, MD.  He has a history of ADDH and takes Adderall.Marland Kitchen He recently used a Zio event monitor showing sinus rhythm no ventricular ectopy rare supraventricular ectopy without SVT atrial fibrillation or flutter and was triggered events were sinus rhythm and sinus tachycardia.  He noticed when he got a new apple watch to get alerts that the heart rate was in the range  of 110-130 beats at rest.  He subsequently utilized a ambulatory event monitor that showed no clinical arrhythmia average heart rate of 93 bpm minimum 48 maximum 143.  He has palpitation but only after intense sexual activity.  He still does activities like golf and has no exercise intolerance exertional chest chest pain shortness of breath palpitation or syncope.  He has episodes of chest pressure it occurs erratically it is not exertional in nature it lasts for up to 15 minutes and spontaneously resolves.  There is no radiation to the jaw or shoulder no shortness of breath diaphoresis no associated GI symptoms of heartburn or indigestion.  He had a mild case of COVID-25 August 2019. He has no history of congenital rheumatic heart disease or heart murmur. Past Medical History:  Diagnosis Date  . Adult ADHD 2017   [place], predominantly inattentive presentation  . Allergies    cats, hay  . Attention deficit hyperactivity disorder (ADHD) 04/24/2016  . Depressive personality disorder 2017   [place]  . Erectile dysfunction     Past Surgical History:  Procedure Laterality Date  . HERNIA REPAIR     umbilical, at age 84 y/o  . KNEE CARTILAGE SURGERY Bilateral 09/20/2018    Current Medications: Current Meds  Medication Sig  . amphetamine-dextroamphetamine (ADDERALL) 20 MG tablet Take 1 tablet (20 mg total)  by mouth 2 (two) times daily. Second dose by 12 noon or 1 PM  . buPROPion (WELLBUTRIN XL) 300 MG 24 hr tablet Take 1 tablet (300 mg total) by mouth daily.  . sildenafil (REVATIO) 20 MG tablet TAKE 3 TO 4 TABLETS BY MOUTH AT BEDTIME AS NEEDED     Allergies:   Patient has no known allergies.   Social History   Socioeconomic History  . Marital status: Married    Spouse name: Not on file  . Number of children: 2  . Years of education: Not on file  . Highest education level: Bachelor's degree (e.g., BA, AB, BS)  Occupational History  . Occupation: IT     Employer: [employer]  Tobacco Use  . Smoking status: Former Smoker    Years: 3.00    Types: Cigarettes, Cigars  . Smokeless tobacco: Never Used  . Tobacco comment: smoked while in college   Vaping Use  . Vaping Use: Never used  Substance and Sexual Activity  . Alcohol use: Yes    Comment: socially   . Drug use: No  . Sexual activity: Not on file  Other Topics Concern  . Not on file  Social History Narrative   Household-- pt , wife ,2009, 2006      Lives at home with wife & children   Right handed   Caffeine: 1 cup/day   Social Determinants of Health   Financial Resource Strain: Not on file  Food Insecurity: Not on file  Transportation Needs: Not on file  Physical Activity: Not on file  Stress: Not on file  Social Connections: Not on file     Family History: The patient's family history includes CAD in an other family member; Headache in his brother; Tongue cancer in his father. There is no history of Colon cancer, Prostate cancer, or Diabetes.  ROS:   ROS Please see the history of present illness.     All other systems reviewed and are negative.  EKGs/Labs/Other Studies Reviewed:    The following studies were reviewed today:   EKG:  EKG is  ordered today.  The ekg ordered today is personally reviewed and demonstrates sinus to 106 bpm normal EKG  Recent Labs: 12/15/2020: ALT 23; BUN 13; Creatinine, Ser 1.03; Hemoglobin 15.3; Platelets 347.0; Potassium 4.5; Sodium 140; TSH 1.52  Recent Lipid Panel    Component Value Date/Time   CHOL 163 07/15/2020 1106   TRIG 180 (H) 07/15/2020 1106   HDL 41 07/15/2020 1106   CHOLHDL 4.0 07/15/2020 1106   VLDL 32.0 08/06/2019 1339   LDLCALC 94 07/15/2020 1106   LDLDIRECT 58.0 06/04/2018 1329    Physical Exam:    VS:  BP 120/84   Pulse (!) 110   Ht 5\' 10"  (1.778 m)   Wt 212 lb (96.2 kg)   SpO2 98%   BMI 30.42 kg/m     Wt Readings from Last 3 Encounters:  01/31/21 212 lb (96.2 kg)  01/27/21 220 lb (99.8 kg)   12/15/20 222 lb 4 oz (100.8 kg)     GEN: Appears his age well nourished, well developed in no acute distress HEENT: Normal NECK: No JVD; No carotid bruits LYMPHATICS: No lymphadenopathy CARDIAC: RRR, no murmurs, rubs, gallops RESPIRATORY:  Clear to auscultation without rales, wheezing or rhonchi  ABDOMEN: Soft, non-tender, non-distended MUSCULOSKELETAL:  No edema; No deformity  SKIN: Warm and dry NEUROLOGIC:  Alert and oriented x 3 PSYCHIATRIC:  Normal affect     Signed, Shirlee More,  MD  01/31/2021 10:46 AM     [facility]

## 2021-01-31 ENCOUNTER — Ambulatory Visit: Payer: BC Managed Care – PPO | Admitting: Cardiology

## 2021-01-31 ENCOUNTER — Encounter: Payer: Self-pay | Admitting: Cardiology

## 2021-01-31 ENCOUNTER — Other Ambulatory Visit: Payer: Self-pay

## 2021-01-31 VITALS — BP 120/84 | HR 110 | Ht 70.0 in | Wt 212.0 lb

## 2021-01-31 DIAGNOSIS — R002 Palpitations: Secondary | ICD-10-CM | POA: Diagnosis not present

## 2021-01-31 DIAGNOSIS — R0789 Other chest pain: Secondary | ICD-10-CM | POA: Diagnosis not present

## 2021-01-31 DIAGNOSIS — T7840XA Allergy, unspecified, initial encounter: Secondary | ICD-10-CM | POA: Insufficient documentation

## 2021-01-31 NOTE — Patient Instructions (Signed)
Medication Instructions:  Your physician recommends that you continue on your current medications as directed. Please refer to the Current Medication list given to you today.  *If you need a refill on your cardiac medications before your next appointment, please call your pharmacy*   Lab Work: None If you have labs (blood work) drawn today and your tests are completely normal, you will receive your results only by: Marland Kitchen MyChart Message (if you have MyChart) OR . A paper copy in the mail If you have any lab test that is abnormal or we need to change your treatment, we will call you to review the results.   Testing/Procedures: Your physician has requested that you have an echocardiogram. Echocardiography is a painless test that uses sound waves to create images of your heart. It provides your doctor with information about the size and shape of your heart and how well your heart's chambers and valves are working. This procedure takes approximately one hour. There are no restrictions for this procedure.    [facility] at [facility] [address], [place],  [address] Phone: [phone]    Please arrive 15 minutes prior to your appointment time for registration and insurance purposes.  The test will take approximately 3 to 4 hours to complete; you may bring reading material.  If someone comes with you to your appointment, they will need to remain in the main lobby due to limited space in the testing area. **If you are pregnant or breastfeeding, please notify the nuclear lab prior to your appointment**  How to prepare for your Myocardial Perfusion Test: . Do not eat or drink 3 hours prior to your test, except you may have water. . Do not consume products containing caffeine (regular or decaffeinated) 12 hours prior to your test. (ex: coffee, chocolate, sodas, tea). . Do bring a list of your current medications with you.  If not listed below, you  may take your medications as normal. . HOLD Erectile dysfunction medication for three days prior.  . Do wear comfortable clothes (no dresses or overalls) and walking shoes, tennis shoes preferred (No heels or open toe shoes are allowed). . Do NOT wear cologne, perfume, aftershave, or lotions (deodorant is allowed). . If these instructions are not followed, your test will have to be rescheduled.  Please report to [address], [address] for your test.  If you have questions or concerns about your appointment, you can call the Nuclear Lab at [phone].  If you cannot keep your appointment, please provide 24 hours notification to the Nuclear Lab, to avoid a possible $50 charge to your account.     Follow-Up: At [facility], you and your health needs are our priority.  As part of our continuing mission to provide you with exceptional heart care, we have created designated Provider Care Teams.  These Care Teams include your primary Cardiologist (physician) and Advanced Practice Providers (APPs -  Physician Assistants and Nurse Practitioners) who all work together to provide you with the care you need, when you need it.  We recommend signing up for the patient portal called "MyChart".  Sign up information is provided on this After Visit Summary.  MyChart is used to connect with patients for Virtual Visits (Telemedicine).  Patients are able to view lab/test results, encounter notes, upcoming appointments, etc.  Non-urgent messages can be sent to your provider as well.   To learn more about what you can do with MyChart, go  to [email or web address].    Your next appointment:   6 week(s)  The format for your next appointment:   In Person  Provider:   Shirlee More, MD   Other Instructions

## 2021-02-02 ENCOUNTER — Encounter (HOSPITAL_COMMUNITY): Payer: Self-pay | Admitting: *Deleted

## 2021-02-02 ENCOUNTER — Telehealth (HOSPITAL_COMMUNITY): Payer: Self-pay | Admitting: *Deleted

## 2021-02-02 NOTE — Telephone Encounter (Signed)
Patient given detailed instructions per Myocardial Perfusion Study Information Sheet for the test on 02/07/21 at 1000. Patient notified to arrive 15 minutes early and that it is imperative to arrive on time for appointment to keep from having the test rescheduled.  If you need to cancel or reschedule your appointment, please call the office within 24 hours of your appointment. . Patient verbalized understanding.[place] Mychart letter sent with instructions.

## 2021-02-02 NOTE — Addendum Note (Signed)
Addended by: Shirlee More on: 02/02/2021 05:15 PM   Modules accepted: Orders

## 2021-02-04 ENCOUNTER — Other Ambulatory Visit (HOSPITAL_COMMUNITY)
Admission: RE | Admit: 2021-02-04 | Discharge: 2021-02-04 | Disposition: A | Discharge status: Home / Self Care | Payer: BC Managed Care – PPO | Source: Ambulatory Visit | Attending: Cardiology | Admitting: Cardiology

## 2021-02-04 DIAGNOSIS — Z01812 Encounter for preprocedural laboratory examination: Secondary | ICD-10-CM | POA: Insufficient documentation

## 2021-02-04 DIAGNOSIS — Z20822 Contact with and (suspected) exposure to covid-19: Secondary | ICD-10-CM | POA: Insufficient documentation

## 2021-02-04 LAB — SARS CORONAVIRUS 2 (TAT 6-24 HRS): SARS Coronavirus 2: NEGATIVE

## 2021-02-07 ENCOUNTER — Other Ambulatory Visit: Payer: Self-pay

## 2021-02-07 ENCOUNTER — Ambulatory Visit (HOSPITAL_COMMUNITY): Payer: BC Managed Care – PPO | Attending: Internal Medicine

## 2021-02-07 DIAGNOSIS — R0789 Other chest pain: Secondary | ICD-10-CM | POA: Diagnosis not present

## 2021-02-07 LAB — MYOCARDIAL PERFUSION IMAGING
Estimated workload: 8.9 METS
Exercise duration (min): 7 min
Exercise duration (sec): 16 s
LV dias vol: 72 mL (ref 62–150)
LV sys vol: 30 mL
MPHR: 176 {beats}/min
Peak HR: 157 {beats}/min
Percent HR: 89 %
RPE: 19
Rest HR: 91 {beats}/min
SDS: 1
SRS: 1
SSS: 2
TID: 0.88

## 2021-02-07 MED ORDER — TECHNETIUM TC 99M TETROFOSMIN IV KIT
10.3000 | PACK | Freq: Once | INTRAVENOUS | Status: AC | PRN
  Administered 2021-02-07: 10.3 via INTRAVENOUS
  Filled 2021-02-07: qty 11

## 2021-02-07 MED ORDER — TECHNETIUM TC 99M TETROFOSMIN IV KIT
31.6000 | PACK | Freq: Once | INTRAVENOUS | Status: AC | PRN
  Administered 2021-02-07: 31.6 via INTRAVENOUS
  Filled 2021-02-07: qty 32

## 2021-02-08 ENCOUNTER — Telehealth: Payer: Self-pay

## 2021-02-08 NOTE — Telephone Encounter (Signed)
-----   Message from Richardo Priest, MD sent at 02/08/2021  7:50 AM EDT ----- His Myoview study is normal no findings of coronary artery disease heart muscle function is normal.

## 2021-02-08 NOTE — Telephone Encounter (Signed)
Spoke with patient regarding results and recommendation.  Patient verbalizes understanding and is agreeable to plan of care. Advised patient to call back with any issues or concerns.  

## 2021-02-14 ENCOUNTER — Ambulatory Visit: Payer: BC Managed Care – PPO | Admitting: Cardiology

## 2021-02-17 ENCOUNTER — Telehealth (INDEPENDENT_AMBULATORY_CARE_PROVIDER_SITE_OTHER): Payer: BC Managed Care – PPO | Admitting: Internal Medicine

## 2021-02-17 ENCOUNTER — Encounter: Payer: Self-pay | Admitting: Internal Medicine

## 2021-02-17 ENCOUNTER — Telehealth: Payer: BC Managed Care – PPO | Admitting: Physician Assistant

## 2021-02-17 VITALS — HR 124 | Ht 70.0 in | Wt 216.0 lb

## 2021-02-17 DIAGNOSIS — B9689 Other specified bacterial agents as the cause of diseases classified elsewhere: Secondary | ICD-10-CM

## 2021-02-17 DIAGNOSIS — J069 Acute upper respiratory infection, unspecified: Secondary | ICD-10-CM

## 2021-02-17 DIAGNOSIS — J208 Acute bronchitis due to other specified organisms: Secondary | ICD-10-CM

## 2021-02-17 MED ORDER — DOXYCYCLINE HYCLATE 100 MG PO TABS: 100.0000 mg | ORAL_TABLET | Freq: Two times a day (BID) | ORAL | 0 refills | Status: DC

## 2021-02-17 NOTE — Progress Notes (Signed)
I have spent 5 minutes in review of e-visit questionnaire, review and updating patient chart, medical decision making and response to patient.   Rachid Parham Cody Ashly Goethe, PA-C    

## 2021-02-17 NOTE — Progress Notes (Signed)
Subjective:    Patient ID: Carl Beltran, male    DOB: 1976-09-29, 45 y.o.   MRN: [id number]  DOS:  02/17/2021 Type of visit - description: Virtual Visit via Video Note  I connected with the above patient  by a video enabled telemedicine application and verified that I am speaking with the correct person using two identifiers.   THIS ENCOUNTER IS A VIRTUAL VISIT DUE TO COVID-19 - PATIENT WAS NOT SEEN IN THE OFFICE. PATIENT HAS CONSENTED TO VIRTUAL VISIT / TELEMEDICINE VISIT   Location of patient: home  Location of provider: office  Persons participating in the virtual visit: patient, provider   I discussed the limitations of evaluation and management by telemedicine and the availability of in person appointments. The patient expressed understanding and agreed to proceed.  Acute Symptoms started 3 days ago: Sinus congestion, sore throat, ear ache. He also has cough with episodic sputum production, yellow in color. Currently taking Xyzal, Tylenol.  Denies fever chills No nausea or vomiting.  No myalgias No chest pain no difficulty breathing    Review of Systems See above   Past Medical History:  Diagnosis Date  . Adult ADHD 2017   [place], predominantly inattentive presentation  . Allergies    cats, hay  . Attention deficit hyperactivity disorder (ADHD) 04/24/2016  . Depressive personality disorder 2017   [place]  . Erectile dysfunction     Past Surgical History:  Procedure Laterality Date  . HERNIA REPAIR     umbilical, at age 15 y/o  . KNEE CARTILAGE SURGERY Bilateral 09/20/2018    Allergies as of 02/17/2021   No Known Allergies     Medication List       Accurate as of February 17, 2021 11:59 AM. If you have any questions, ask your nurse or doctor.        amphetamine-dextroamphetamine 20 MG tablet Commonly known as: Adderall Take 1 tablet (20 mg total) by mouth 2 (two) times daily. Second dose by 12 noon or 1 PM    buPROPion 300 MG 24 hr tablet Commonly known as: Wellbutrin XL Take 1 tablet (300 mg total) by mouth daily.   doxycycline 100 MG tablet Commonly known as: VIBRA-TABS Take 1 tablet (100 mg total) by mouth 2 (two) times daily. Started by: Leeanne Rio, PA-C   Fluarix Quadrivalent 0.5 ML injection Generic drug: influenza vac split quadrivalent PF TAKE AS DIRECTED   sildenafil 20 MG tablet Commonly known as: REVATIO TAKE 3 TO 4 TABLETS BY MOUTH AT BEDTIME AS NEEDED         Objective:   Physical Exam Pulse (!) 124   Ht 5\' 10"  (1.778 m)   Wt 216 lb (98 kg)   BMI 30.99 kg/m  This is a virtual video visit, alert oriented x3, in no respiratory distress, speaking in complete sentences, nose is slightly congested, no cough noted. Heart rate is elevated (not a new issue).      Assessment     Assessment ADHD (confirmed by psychology 03-2016) Depression (dx by psych 03-2016) ED Covid infex 09-2019   PLAN: URI: Symptoms consistent with URI, recommend conservative treatment with antihistaminic, Tylenol, Flonase, Mucinex DM. He had 3 vaccines against COVID, nevertheless recommend to be check with a home test. He already obtained antibiotic prescription from another source, recommend not to take antibiotic unless he is not improving in the next few days. He verbalized understanding     I discussed the assessment and  treatment plan with the patient. The patient was provided an opportunity to ask questions and all were answered. The patient agreed with the plan and demonstrated an understanding of the instructions.   The patient was advised to call back or seek an in-person evaluation if the symptoms worsen or if the condition fails to improve as anticipated.

## 2021-02-17 NOTE — Progress Notes (Signed)
We are sorry that you are not feeling well.  Here is how we plan to help!  Based on your presentation I believe you most likely have A cough due to bacteria.  When patients have a fever and a productive cough with a change in color or increased sputum production, we are concerned about bacterial bronchitis.  If left untreated it can progress to pneumonia.  If your symptoms do not improve with your treatment plan it is important that you contact your provider.   I have prescribed Doxycycline 100 mg twice a day for 7 days     In addition you may use A non-prescription cough medication called Mucinex DM: take 2 tablets every 12 hours.   From your responses in the eVisit questionnaire you describe inflammation in the upper respiratory tract which is causing a significant cough.  This is commonly called Bronchitis and has four common causes:    Allergies  Viral Infections  Acid Reflux  Bacterial Infection Allergies, viruses and acid reflux are treated by controlling symptoms or eliminating the cause. An example might be a cough caused by taking certain blood pressure medications. You stop the cough by changing the medication. Another example might be a cough caused by acid reflux. Controlling the reflux helps control the cough.  USE OF BRONCHODILATOR ("RESCUE") INHALERS: There is a risk from using your bronchodilator too frequently.  The risk is that over-reliance on a medication which only relaxes the muscles surrounding the breathing tubes can reduce the effectiveness of medications prescribed to reduce swelling and congestion of the tubes themselves.  Although you feel brief relief from the bronchodilator inhaler, your asthma may actually be worsening with the tubes becoming more swollen and filled with mucus.  This can delay other crucial treatments, such as oral steroid medications. If you need to use a bronchodilator inhaler daily, several times per day, you should discuss this with your  provider.  There are probably better treatments that could be used to keep your asthma under control.     HOME CARE . Only take medications as instructed by your medical team. . Complete the entire course of an antibiotic. . Drink plenty of fluids and get plenty of rest. . Avoid close contacts especially the very young and the elderly . Cover your mouth if you cough or cough into your sleeve. . Always remember to wash your hands . A steam or ultrasonic humidifier can help congestion.   GET HELP RIGHT AWAY IF: . You develop worsening fever. . You become short of breath . You cough up blood. . Your symptoms persist after you have completed your treatment plan MAKE SURE YOU   Understand these instructions.  Will watch your condition.  Will get help right away if you are not doing well or get worse.  Your e-visit answers were reviewed by a board certified advanced clinical practitioner to complete your personal care plan.  Depending on the condition, your plan could have included both over the counter or prescription medications. If there is a problem please reply  once you have received a response from your provider. Your safety is important to us.  If you have drug allergies check your prescription carefully.    You can use MyChart to ask questions about today's visit, request a non-urgent call back, or ask for a work or school excuse for 24 hours related to this e-Visit. If it has been greater than 24 hours you will need to follow up with your provider,   or enter a new e-Visit to address those concerns. You will get an e-mail in the next two days asking about your experience.  I hope that your e-visit has been valuable and will speed your recovery. Thank you for using e-visits.

## 2021-02-19 NOTE — Assessment & Plan Note (Signed)
URI: Symptoms consistent with URI, recommend conservative treatment with antihistaminic, Tylenol, Flonase, Mucinex DM. He had 3 vaccines against COVID, nevertheless recommend to be check with a home test. He already obtained antibiotic prescription from another source, recommend not to take antibiotic unless he is not improving in the next few days. He verbalized understanding

## 2021-03-09 ENCOUNTER — Other Ambulatory Visit: Payer: Self-pay

## 2021-03-09 ENCOUNTER — Ambulatory Visit (HOSPITAL_BASED_OUTPATIENT_CLINIC_OR_DEPARTMENT_OTHER)
Admission: RE | Admit: 2021-03-09 | Discharge: 2021-03-09 | Disposition: A | Discharge status: Home / Self Care | Payer: BC Managed Care – PPO | Source: Ambulatory Visit | Attending: Cardiology | Admitting: Cardiology

## 2021-03-09 DIAGNOSIS — R0789 Other chest pain: Secondary | ICD-10-CM | POA: Insufficient documentation

## 2021-03-09 DIAGNOSIS — R002 Palpitations: Secondary | ICD-10-CM

## 2021-03-09 LAB — ECHOCARDIOGRAM COMPLETE
AR max vel: 3.55 cm2
AV Area VTI: 3.6 cm2
AV Area mean vel: 2.82 cm2
AV Mean grad: 4 mmHg
AV Peak grad: 6.2 mmHg
Ao pk vel: 1.24 m/s
Area-P 1/2: 4.6 cm2
Calc EF: 64 %
S' Lateral: 2.61 cm
Single Plane A2C EF: 62.4 %
Single Plane A4C EF: 65.4 %

## 2021-03-11 ENCOUNTER — Telehealth: Payer: Self-pay

## 2021-03-11 NOTE — Telephone Encounter (Signed)
Spoke with patient regarding results and recommendation.  Patient verbalizes understanding and is agreeable to plan of care. Advised patient to call back with any issues or concerns.  

## 2021-03-11 NOTE — Telephone Encounter (Signed)
-----   Message from Richardo Priest, MD sent at 03/11/2021  1:29 PM EDT ----- Echo is normal No problem with heart muscle function

## 2021-03-14 ENCOUNTER — Ambulatory Visit: Payer: BC Managed Care – PPO | Admitting: Cardiology

## 2021-04-14 ENCOUNTER — Other Ambulatory Visit: Payer: Self-pay | Admitting: Internal Medicine

## 2021-04-14 ENCOUNTER — Encounter: Payer: Self-pay | Admitting: Internal Medicine

## 2021-04-14 NOTE — Telephone Encounter (Signed)
Requesting: adderall  Contract:12/21/2020 UDS:07/15/2020 Last Visit:02/17/2021 Next Visit:04/29/2021 Last Refill:01/24/2021  Please Advise

## 2021-04-15 MED ORDER — AMPHETAMINE-DEXTROAMPHETAMINE 20 MG PO TABS: 20.0000 mg | ORAL_TABLET | Freq: Two times a day (BID) | ORAL | 0 refills | Status: DC

## 2021-04-15 NOTE — Telephone Encounter (Signed)
PDMP reviewed, is a slightly early but prescription sent.

## 2021-04-29 ENCOUNTER — Ambulatory Visit: Payer: BC Managed Care – PPO | Admitting: Internal Medicine

## 2021-04-29 ENCOUNTER — Encounter: Payer: Self-pay | Admitting: Internal Medicine

## 2021-04-29 ENCOUNTER — Other Ambulatory Visit: Payer: Self-pay

## 2021-04-29 VITALS — BP 132/72 | HR 108 | Temp 97.8°F | Resp 16 | Ht 70.0 in | Wt 220.2 lb

## 2021-04-29 DIAGNOSIS — Z0189 Encounter for other specified special examinations: Secondary | ICD-10-CM

## 2021-04-29 DIAGNOSIS — F909 Attention-deficit hyperactivity disorder, unspecified type: Secondary | ICD-10-CM

## 2021-04-29 DIAGNOSIS — R0683 Snoring: Secondary | ICD-10-CM | POA: Diagnosis not present

## 2021-04-29 NOTE — Progress Notes (Signed)
Subjective:    Patient ID: Carl Beltran, male    DOB: 05-03-1976, 45 y.o.   MRN: [id number]  DOS:  04/29/2021 Type of visit - description: Acute  Due to persisting tachycardia: Cardiology recommended to stop Adderall and reassess. He did stop Adderall 2 weeks ago, today BP is very good, he is still slightly tachycardic.  Since he stopped Adderall, his concentration has decreased Also he is easy to nod off if the task in front of him is boring. Interestingly, he is also waking up more frequently at night.  When asked, admits to snoring. Emotionally doing okay with Wellbutrin.  Review of Systems See above   Past Medical History:  Diagnosis Date   Adult ADHD 2017   [place], predominantly inattentive presentation   Allergies    cats, hay   Attention deficit hyperactivity disorder (ADHD) 04/24/2016   Depressive personality disorder 2017   [facility]   Erectile dysfunction     Past Surgical History:  Procedure Laterality Date   HERNIA REPAIR     umbilical, at age 70 y/o   KNEE CARTILAGE SURGERY Bilateral 09/20/2018    Allergies as of 04/29/2021   No Known Allergies      Medication List        Accurate as of April 29, 2021 11:59 PM. If you have any questions, ask your nurse or doctor.          STOP taking these medications    amphetamine-dextroamphetamine 20 MG tablet Commonly known as: Adderall Stopped by: Kathlene November, MD   doxycycline 100 MG tablet Commonly known as: VIBRA-TABS Stopped by: Kathlene November, MD   Fluarix Quadrivalent 0.5 ML injection Generic drug: influenza vac split quadrivalent PF Stopped by: Kathlene November, MD       TAKE these medications    buPROPion 300 MG 24 hr tablet Commonly known as: Wellbutrin XL Take 1 tablet (300 mg total) by mouth daily.   sildenafil 20 MG tablet Commonly known as: REVATIO TAKE 3 TO 4 TABLETS BY MOUTH AT BEDTIME AS NEEDED           Objective:   Physical Exam BP 132/72 (BP  Location: Left Arm, Patient Position: Sitting, Cuff Size: Normal)   Pulse (!) 108   Temp 97.8 F (36.6 C) (Oral)   Resp 16   Ht 5\' 10"  (1.778 m)   Wt 220 lb 4 oz (99.9 kg)   SpO2 95%   BMI 31.60 kg/m  General:   Well developed, NAD, BMI noted. HEENT:  Normocephalic . Face symmetric, atraumatic Throat is crowded Lower extremities: no pretibial edema bilaterally  Skin: Not pale. Not jaundice Neurologic:  alert & oriented X3.  Speech normal, gait appropriate for age and unassisted Psych--  Cognition and judgment appear intact.  Cooperative with normal attention span and concentration.  Behavior appropriate. No anxious or depressed appearing.      Assessment    Assessment ADHD (confirmed by psychology 03-2016) Depression (dx by psych 03-2016) ED Covid infex 09-2019   PLAN: Palpitations, tachycardia: Recently cardiology recommend to stop Adderall d/t tachycardia, still slightly tachycardic. ADD: See above, stopped Adderall ADD symptoms  resurfaced.  I think he needs to be on a nonstimulant, will refer him to a specialist Snoring.  Long-term snoring, he stopped adderall and is nodding off frequently, his neck anatomy makes him prone to OSA.  Epworth scale: 9. Plan: Refer to neurology.r/o OSA RTC CPX 07-2021   This visit occurred during the SARS-CoV-2  public health emergency.  Safety protocols were in place, including screening questions prior to the visit, additional usage of staff PPE, and extensive cleaning of exam room while observing appropriate contact time as indicated for disinfecting solutions.

## 2021-04-29 NOTE — Patient Instructions (Signed)
We are referring you to neurology for sleep apnea evaluation  Were sending you to the attention deficit specialist  Next visit: September 2022 CPX

## 2021-04-30 NOTE — Assessment & Plan Note (Signed)
Palpitations, tachycardia: Recently cardiology recommend to stop Adderall d/t tachycardia, still slightly tachycardic. ADD: See above, stopped Adderall ADD symptoms  resurfaced.  I think he needs to be on a nonstimulant, will refer him to a specialist Snoring.  Long-term snoring, he stopped adderall and is nodding off frequently, his neck anatomy makes him prone to OSA.  Epworth scale: 9. Plan: Refer to neurology.r/o OSA RTC CPX 07-2021

## 2021-07-04 ENCOUNTER — Other Ambulatory Visit: Payer: Self-pay | Admitting: Internal Medicine

## 2021-07-04 ENCOUNTER — Institutional Professional Consult (permissible substitution): Payer: Managed Care, Other (non HMO) | Admitting: Neurology

## 2021-07-05 ENCOUNTER — Encounter: Payer: Self-pay | Admitting: Internal Medicine

## 2021-07-17 ENCOUNTER — Encounter: Payer: Self-pay | Admitting: Internal Medicine

## 2021-07-18 ENCOUNTER — Encounter: Payer: Self-pay | Admitting: Internal Medicine

## 2021-07-18 ENCOUNTER — Telehealth (INDEPENDENT_AMBULATORY_CARE_PROVIDER_SITE_OTHER): Payer: Managed Care, Other (non HMO) | Admitting: Internal Medicine

## 2021-07-18 ENCOUNTER — Other Ambulatory Visit: Payer: Self-pay

## 2021-07-18 VITALS — HR 92 | Ht 70.0 in | Wt 213.0 lb

## 2021-07-18 DIAGNOSIS — U071 COVID-19: Secondary | ICD-10-CM | POA: Diagnosis not present

## 2021-07-18 MED ORDER — MOLNUPIRAVIR EUA 200MG CAPSULE: 4.0000 | ORAL_CAPSULE | Freq: Two times a day (BID) | ORAL | 0 refills | Status: AC

## 2021-07-18 NOTE — Progress Notes (Signed)
Subjective:    Patient ID: Carl Beltran, male    DOB: Apr 29, 1976, 45 y.o.   MRN: [id number]  DOS:  07/18/2021 Type of visit - description: Virtual Visit via Video Note  I connected with the above patient  by a video enabled telemedicine application and verified that I am speaking with the correct person using two identifiers.   THIS ENCOUNTER IS A VIRTUAL VISIT DUE TO COVID-19 - PATIENT WAS NOT SEEN IN THE OFFICE. PATIENT HAS CONSENTED TO VIRTUAL VISIT / TELEMEDICINE VISIT   Location of patient: home  Location of provider: office  Persons participating in the virtual visit: patient, provider   I discussed the limitations of evaluation and management by telemedicine and the availability of in person appointments. The patient expressed understanding and agreed to proceed.  Acute  Symptoms a started 3 days ago, Cough, chest congestion, no sputum productions. Reports chills the first night, the next day if developed sore throat. He was out of town, subsequently he flew back to [place] and tested positive.  Denies any nausea or vomiting Yesterday had some mild chest pain/shortness of breath but that is largely resolved today. No other family members affected  Review of Systems See above   Past Medical History:  Diagnosis Date   Adult ADHD 2017   [place], predominantly inattentive presentation   Allergies    cats, hay   Attention deficit hyperactivity disorder (ADHD) 04/24/2016   Depressive personality disorder 2017   [facility]   Erectile dysfunction     Past Surgical History:  Procedure Laterality Date   HERNIA REPAIR     umbilical, at age 25 y/o   KNEE CARTILAGE SURGERY Bilateral 09/20/2018    Allergies as of 07/18/2021   No Known Allergies      Medication List        Accurate as of July 18, 2021  3:57 PM. If you have any questions, ask your nurse or doctor.          buPROPion 300 MG 24 hr tablet Commonly known as:  WELLBUTRIN XL TAKE 1 TABLET(300 MG) BY MOUTH DAILY   sildenafil 20 MG tablet Commonly known as: REVATIO TAKE 3 TO 4 TABLETS BY MOUTH AT BEDTIME AS NEEDED           Objective:   Physical Exam Pulse 92   Ht '5\' 10"'$  (1.778 m)   Wt 213 lb (96.6 kg)   BMI 30.56 kg/m  This is a virtual video visit, alert oriented x3, in no distress, he did cough a few times, some chest congestion was audible. No blood pressures are available.  O2 sat 96%. At some point, we switch to telephone due to problems with audio.    Assessment     Assessment ADHD (confirmed by psychology 03-2016) Depression (dx by psych 03-2016) ED Covid infex 09-2019, 07-2021   PLAN: COVID infection: Symptoms as described above, he had 3 vaccines. Advised that antivirals will decrease his chances to get sicker.  We agreed on the following: Molnupiravir Rest, fluids, Tylenol, Mucinex DM. Recommend to isolate for total of 10 days. COVID booster in 4 weeks. He verbalized understanding.      I discussed the assessment and treatment plan with the patient. The patient was provided an opportunity to ask questions and all were answered. The patient agreed with the plan and demonstrated an understanding of the instructions.   The patient was advised to call back or seek an in-person evaluation if the  symptoms worsen or if the condition fails to improve as anticipated.

## 2021-07-19 NOTE — Assessment & Plan Note (Signed)
COVID infection: Symptoms as described above, he had 3 vaccines. Advised that antivirals will decrease his chances to get sicker.  We agreed on the following: Molnupiravir Rest, fluids, Tylenol, Mucinex DM. Recommend to isolate for total of 10 days. COVID booster in 4 weeks. He verbalized understanding.

## 2021-08-03 ENCOUNTER — Ambulatory Visit (INDEPENDENT_AMBULATORY_CARE_PROVIDER_SITE_OTHER): Payer: Managed Care, Other (non HMO) | Admitting: Internal Medicine

## 2021-08-03 ENCOUNTER — Other Ambulatory Visit: Payer: Self-pay

## 2021-08-03 ENCOUNTER — Encounter: Payer: Self-pay | Admitting: Internal Medicine

## 2021-08-03 ENCOUNTER — Encounter: Payer: BC Managed Care – PPO | Admitting: Internal Medicine

## 2021-08-03 VITALS — BP 122/84 | HR 104 | Temp 98.0°F | Resp 16 | Ht 70.0 in | Wt 220.0 lb

## 2021-08-03 DIAGNOSIS — N529 Male erectile dysfunction, unspecified: Secondary | ICD-10-CM | POA: Diagnosis not present

## 2021-08-03 DIAGNOSIS — Z0000 Encounter for general adult medical examination without abnormal findings: Secondary | ICD-10-CM

## 2021-08-03 DIAGNOSIS — Z1211 Encounter for screening for malignant neoplasm of colon: Secondary | ICD-10-CM

## 2021-08-03 MED ORDER — SILDENAFIL CITRATE 20 MG PO TABS
ORAL_TABLET | ORAL | 3 refills | Status: DC
Start: 2021-08-03 — End: 2021-12-26

## 2021-08-03 NOTE — Progress Notes (Signed)
Subjective:    Patient ID: Carl Beltran, male    DOB: Aug 04, 1976, 45 y.o.   MRN: [id number]  DOS:  08/03/2021 Type of visit - description: CPX  Here for CPX, few weeks ago he had COVID.  Feels completely recuperated except for some fatigue. Shortly after COVID he had 2 episodes of chest pain, lasted few minutes, no associated symptoms such as nausea/diaphoresis/shortness of breath. Did not have any leg swelling or calf pain. Symptoms were at rest.   Review of Systems  Other than above, a 14 point review of systems is negative       Past Medical History:  Diagnosis Date   Adult ADHD 2017   [place], predominantly inattentive presentation   Allergies    cats, hay   Attention deficit hyperactivity disorder (ADHD) 04/24/2016   Depressive personality disorder 2017   [facility]   Erectile dysfunction     Past Surgical History:  Procedure Laterality Date   HERNIA REPAIR     umbilical, at age 9 y/o   KNEE CARTILAGE SURGERY Bilateral 09/20/2018   Social History   Socioeconomic History   Marital status: Married    Spouse name: Not on file   Number of children: 2   Years of education: Not on file   Highest education level: Bachelor's degree (e.g., BA, AB, BS)  Occupational History   Occupation: IT    Employer: [employer]  Tobacco Use   Smoking status: Former    Years: 3.00    Types: Cigarettes, Cigars   Smokeless tobacco: Never   Tobacco comments:    smoked while in college   Vaping Use   Vaping Use: Never used  Substance and Sexual Activity   Alcohol use: Yes    Comment: socially    Drug use: No   Sexual activity: Not on file  Other Topics Concern   Not on file  Social History Narrative   Household-- pt , wife ,2009, 2006      Lives at home with wife & children   Right handed   Caffeine: 1 cup/day   Social Determinants of Health   Financial Resource Strain: Not on file  Food Insecurity: Not on file  Transportation  Needs: Not on file  Physical Activity: Not on file  Stress: Not on file  Social Connections: Not on file  Intimate Partner Violence: Not on file    Allergies as of 08/03/2021   No Known Allergies      Medication List        Accurate as of August 03, 2021 11:59 PM. If you have any questions, ask your nurse or doctor.          STOP taking these medications    buPROPion 300 MG 24 hr tablet Commonly known as: WELLBUTRIN XL Stopped by: Kathlene November, MD       TAKE these medications    sildenafil 20 MG tablet Commonly known as: REVATIO TAKE 3-4 TABLETS BY MOUTH AT BEDTIME AS NEEDED What changed: See the new instructions. Changed by: Kathlene November, MD           Objective:   Physical Exam BP 122/84 (BP Location: Left Arm, Patient Position: Sitting, Cuff Size: Normal)   Pulse (!) 104   Temp 98 F (36.7 C) (Oral)   Resp 16   Ht 5\' 10"  (1.778 m)   Wt 220 lb (99.8 kg)   SpO2 97%   BMI 31.57 kg/m  General: Well developed,  NAD, BMI noted Neck: No  thyromegaly  HEENT:  Normocephalic . Face symmetric, atraumatic Lungs:  CTA B Normal respiratory effort, no intercostal retractions, no accessory muscle use. Heart: RRR,  no murmur.  Abdomen:  Not distended, soft, non-tender. No rebound or rigidity.   Lower extremities: no pretibial edema bilaterally  Skin: Exposed areas without rash. Not pale. Not jaundice Neurologic:  alert & oriented X3.  Speech normal, gait appropriate for age and unassisted Strength symmetric and appropriate for age.  Psych: Cognition and judgment appear intact.  Cooperative with normal attention span and concentration.  Behavior appropriate. No anxious or depressed appearing.     Assessment     Assessment ADHD (confirmed by psychology 03-2016) Depression (dx by psych 03-2016) ED Covid infex 09-2019, 07-2021   PLAN: Here for CPX, we also discussed other issues COVID-19: Last episode diagnosed recently, he feels fully recuperated except  for some fatigue.  Had 2 episode of chest pain, unlikely to represent a cardiac issue or PE.  He feels better.  Rec observation.  If chest pain continue happening he will let me know ADHD: Off medications for several weeks, was not able to see psychology, does not like to restart any meds at this point. Depression: Off Wellbutrin for several weeks, self stopped, does not like to restart anything at this point. ED: RF sildenafil as needed Palpitations: No palpitations but heart rate is still slightly elevated.  Reassess in 6 months RTC 6 months  This visit occurred during the SARS-CoV-2 public health emergency.  Safety protocols were in place, including screening questions prior to the visit, additional usage of staff PPE, and extensive cleaning of exam room while observing appropriate contact time as indicated for disinfecting solutions.

## 2021-08-03 NOTE — Patient Instructions (Addendum)
You are overdue for a colonoscopy with Dr. Carlean Purl. Please call his office at [phone] to get on his schedule.      GO TO THE FRONT DESK, PLEASE SCHEDULE YOUR APPOINTMENTS Come back for fasting blood work early in the morning at your convenience  Come back for a checkup in 6 months

## 2021-08-04 ENCOUNTER — Encounter: Payer: Self-pay | Admitting: Internal Medicine

## 2021-08-04 NOTE — Assessment & Plan Note (Signed)
-  Td 2015 - s/p C-19 vaccines , rec booster  - flu shot rec q year -Had a Colonoscopy in 2008 for rectal bleeding, he is due for a CCS, 3 options discussed, elected colonoscopy.  See AVS -Diet and exercise discussed  -LABS : Will return fasting for a CMP, CBC, FLP and a free testosterone per patient request.

## 2021-08-04 NOTE — Assessment & Plan Note (Signed)
Here for CPX, we also discussed other issues COVID-19: Last episode diagnosed recently, he feels fully recuperated except for some fatigue.  Had 2 episode of chest pain, unlikely to represent a cardiac issue or PE.  He feels better.  Rec observation.  If chest pain continue happening he will let me know ADHD: Off medications for several weeks, was not able to see psychology, does not like to restart any meds at this point. Depression: Off Wellbutrin for several weeks, self stopped, does not like to restart anything at this point. ED: RF sildenafil as needed Palpitations: No palpitations but heart rate is still slightly elevated.  Reassess in 6 months RTC 6 months

## 2021-08-14 ENCOUNTER — Emergency Department (HOSPITAL_COMMUNITY): Payer: Managed Care, Other (non HMO)

## 2021-08-14 ENCOUNTER — Other Ambulatory Visit: Payer: Self-pay

## 2021-08-14 ENCOUNTER — Encounter (HOSPITAL_COMMUNITY): Payer: Self-pay | Admitting: Emergency Medicine

## 2021-08-14 ENCOUNTER — Emergency Department (HOSPITAL_COMMUNITY)
Admission: EM | Admit: 2021-08-14 | Discharge: 2021-08-14 | Disposition: A | Discharge status: Home / Self Care | Payer: Managed Care, Other (non HMO) | Attending: Emergency Medicine | Admitting: Emergency Medicine

## 2021-08-14 DIAGNOSIS — G4700 Insomnia, unspecified: Secondary | ICD-10-CM | POA: Diagnosis not present

## 2021-08-14 DIAGNOSIS — Z87891 Personal history of nicotine dependence: Secondary | ICD-10-CM | POA: Insufficient documentation

## 2021-08-14 DIAGNOSIS — R079 Chest pain, unspecified: Secondary | ICD-10-CM | POA: Diagnosis not present

## 2021-08-14 DIAGNOSIS — R410 Disorientation, unspecified: Secondary | ICD-10-CM | POA: Diagnosis not present

## 2021-08-14 DIAGNOSIS — G4730 Sleep apnea, unspecified: Secondary | ICD-10-CM | POA: Insufficient documentation

## 2021-08-14 DIAGNOSIS — R400 Somnolence: Secondary | ICD-10-CM | POA: Diagnosis not present

## 2021-08-14 LAB — BASIC METABOLIC PANEL
Anion gap: 9 (ref 5–15)
BUN: 10 mg/dL (ref 6–20)
CO2: 26 mmol/L (ref 22–32)
Calcium: 9.1 mg/dL (ref 8.9–10.3)
Chloride: 102 mmol/L (ref 98–111)
Creatinine, Ser: 1.06 mg/dL (ref 0.61–1.24)
GFR, Estimated: >60 mL/min (ref >60)
Glucose, Bld: 116 mg/dL — ABNORMAL HIGH (ref 70–99)
Potassium: 3.6 mmol/L (ref 3.5–5.1)
Sodium: 137 mmol/L (ref 135–145)

## 2021-08-14 LAB — CBC
HCT: 46.6 % (ref 39.0–52.0)
Hemoglobin: 15.8 g/dL (ref 13.0–17.0)
MCH: 29.9 pg (ref 26.0–34.0)
MCHC: 33.9 g/dL (ref 30.0–36.0)
MCV: 88.1 fL (ref 80.0–100.0)
Platelets: 379 10*3/uL (ref 150–400)
RBC: 5.29 MIL/uL (ref 4.22–5.81)
RDW: 13.1 % (ref 11.5–15.5)
WBC: 10.6 10*3/uL — ABNORMAL HIGH (ref 4.0–10.5)
nRBC: 0 % (ref 0.0–0.2)

## 2021-08-14 LAB — TROPONIN I (HIGH SENSITIVITY)
Troponin I (High Sensitivity): 6 ng/L (ref <18)
Troponin I (High Sensitivity): 6 ng/L (ref <18)

## 2021-08-14 MED ORDER — PREDNISONE 20 MG PO TABS: 20.0000 mg | ORAL_TABLET | Freq: Two times a day (BID) | ORAL | 0 refills | Status: DC

## 2021-08-14 NOTE — Discharge Instructions (Addendum)
It is highly likely that you have sleep apnea causing nighttime insomnia and daytime sleepiness.  We are giving you a prescription for prednisone to see if that can help, to give you a better night sleep.  We have set up an ambulatory referral to neurology for a sleep study.  Call your primary care doctor as needed for further help and assistance.  The testing today for your heart was reassuring.  There is no sign of a stroke, currently.

## 2021-08-14 NOTE — ED Triage Notes (Signed)
Pt reports pain to center of chest x 1 hour with SOB and dizziness.  Reports slurred speech and trouble speaking since yesterday morning.  No arm drift.  States he has problems staying awake.

## 2021-08-14 NOTE — ED Provider Notes (Signed)
[facility] EMERGENCY DEPARTMENT Provider Note   CSN: [id number] Arrival date & time: 08/14/21  1417     History Chief Complaint  Patient presents with   Chest Pain    Carl Beltran is a 45 y.o. male.  HPI Presents for evaluation of chest pain, confusion, difficulty talking, memory lapses, and difficulty sleeping for 2 weeks.  He also states that during the day he can be sitting in a chair and immediately fall asleep.  If he is concentrating on things he will not fall asleep.  His wife states he snores heavily.  He had COVID about 2 months ago and laid in bed for several weeks.  He does not smoke.  He is currently being treated for a biceps tendon tear with diclofenac.  He has been using extra diclofenac, because of pain in the left upper arm.  He has previously been referred for sleep apnea study but it was canceled because he had COVID.  He denies fever, chills, cough, persistent shortness of breath.  His episodes of chest pain have been intermittent over the last couple days and lasts about an hour at a time.  He does not have diaphoresis, nausea or vomiting.  No prior cardiac history.  He is not having any sinus congestion with sore throat currently.  Past Medical History:  Diagnosis Date   Adult ADHD 2017   [place], predominantly inattentive presentation   Allergies    cats, hay   Attention deficit hyperactivity disorder (ADHD) 04/24/2016   Depressive personality disorder 2017   [place]   Erectile dysfunction     Patient Active Problem List   Diagnosis Date Noted   Allergies    Chronic daily headache 03/02/2020   Migraine without aura and without status migrainosus, not intractable 03/02/2020   Chronic tension-type headache, intractable 03/02/2020   Depression 10/02/2017   Attention deficit hyperactivity disorder (ADHD) 04/24/2016   Depressive personality disorder 2017   Adult ADHD 2017   PCP NOTES >>>>>>> 10/27/2015    Annual physical exam 10/28/2014   Wheezing 08/26/2014   Erectile dysfunction 07/28/2013   HEMORRHOIDS-INTERNAL 12/27/2009    Past Surgical History:  Procedure Laterality Date   HERNIA REPAIR     umbilical, at age 80 y/o   KNEE CARTILAGE SURGERY Bilateral 09/20/2018       Family History  Problem Relation Age of Onset   CAD Other        GF   Tongue cancer Father    Headache Brother    Colon cancer Neg Hx    Prostate cancer Neg Hx    Diabetes Neg Hx     Social History   Tobacco Use   Smoking status: Former    Years: 3.00    Types: Cigarettes, Cigars   Smokeless tobacco: Never   Tobacco comments:    smoked while in college   Vaping Use   Vaping Use: Never used  Substance Use Topics   Alcohol use: Yes    Comment: socially    Drug use: No    Home Medications Prior to Admission medications   Medication Sig Start Date End Date Taking? Authorizing Provider  predniSONE (DELTASONE) 20 MG tablet Take 1 tablet (20 mg total) by mouth 2 (two) times daily. 08/14/21  Yes Daleen Bo, MD  sildenafil (REVATIO) 20 MG tablet TAKE 3-4 TABLETS BY MOUTH AT BEDTIME AS NEEDED 08/03/21   Colon Branch, MD    Allergies    Patient  has no known allergies.  Review of Systems   Review of Systems  All other systems reviewed and are negative.  Physical Exam Updated Vital Signs BP (!) 141/88 (BP Location: Left Arm)   Pulse (!) 107   Temp 98.5 F (36.9 C) (Oral)   Resp 15   SpO2 97%   Physical Exam Vitals and nursing note reviewed.  Constitutional:      General: He is not in acute distress.    Appearance: He is well-developed. He is not ill-appearing, toxic-appearing or diaphoretic.     Comments: Overweight.  Large neck.  HENT:     Head: Normocephalic and atraumatic.     Right Ear: External ear normal.     Left Ear: External ear normal.     Nose: No congestion or rhinorrhea.  Eyes:     Conjunctiva/sclera: Conjunctivae normal.     Pupils: Pupils are equal, round, and reactive  to light.  Neck:     Trachea: Phonation normal.  Cardiovascular:     Rate and Rhythm: Normal rate and regular rhythm.     Heart sounds: Normal heart sounds.  Pulmonary:     Effort: Pulmonary effort is normal.     Breath sounds: Normal breath sounds.  Abdominal:     Palpations: Abdomen is soft.     Tenderness: There is no abdominal tenderness.  Musculoskeletal:        General: Normal range of motion.     Cervical back: Normal range of motion and neck supple.  Skin:    General: Skin is warm and dry.  Neurological:     Mental Status: He is alert and oriented to person, place, and time.     Cranial Nerves: No cranial nerve deficit.     Sensory: No sensory deficit.     Motor: No abnormal muscle tone.     Coordination: Coordination normal.  Psychiatric:        Mood and Affect: Mood normal.        Behavior: Behavior normal.        Thought Content: Thought content normal.        Judgment: Judgment normal.    ED Results / Procedures / Treatments   Labs (all labs ordered are listed, but only abnormal results are displayed) Labs Reviewed  BASIC METABOLIC PANEL - Abnormal; Notable for the following components:      Result Value   Glucose, Bld 116 (*)    All other components within normal limits  CBC - Abnormal; Notable for the following components:   WBC 10.6 (*)    All other components within normal limits  TROPONIN I (HIGH SENSITIVITY)  TROPONIN I (HIGH SENSITIVITY)    EKG EKG Interpretation  Date/Time:  Sunday August 14 2021 14:35:08 EDT Ventricular Rate:  110 PR Interval:  126 QRS Duration: 66 QT Interval:  306 QTC Calculation: 414 R Axis:   86 Text Interpretation: Sinus tachycardia Otherwise normal ECG No old tracing to compare Confirmed by Daleen Bo [phone]) on 08/14/2021 5:05:28 PM  Radiology DG Chest 2 View  Result Date: 08/14/2021 CLINICAL DATA:  Sharp chest pain since this morning EXAM: CHEST - 2 VIEW COMPARISON:  08/26/2014 FINDINGS: The heart size and  mediastinal contours are within normal limits. Both lungs are clear. The visualized skeletal structures are unremarkable. IMPRESSION: No active cardiopulmonary disease. Electronically Signed   By: Randa Ngo M.D.   On: 08/14/2021 15:05    Procedures Procedures   Medications Ordered in ED Medications -  No data to display  ED Course  I have reviewed the triage vital signs and the nursing notes.  Pertinent labs & imaging results that were available during my care of the patient were reviewed by me and considered in my medical decision making (see chart for details).    MDM Rules/Calculators/A&P                            Patient Vitals for the past 24 hrs:  BP Temp Temp src Pulse Resp SpO2  08/14/21 1604 (!) 141/88 98.5 F (36.9 C) Oral (!) 107 15 97 %  08/14/21 1432 (!) 144/95 98.1 F (36.7 C) Oral (!) 111 18 98 %    5:29 PM Reevaluation with update and discussion. After initial assessment and treatment, an updated evaluation reveals no change in clinical status, findings discussed and questions answered. Daleen Bo   Medical Decision Making:  This patient is presenting for evaluation of chest pain, insomnia, sleepiness, which does require a range of treatment options, and is a complaint that involves a moderate risk of morbidity and mortality. The differential diagnoses include sleep apnea, ACS, complications from recent COVID infection. I decided to review old records, and in summary middle-aged male with ongoing snoring and previous consideration for sleep apnea, now with worsening insomnia, and complications of daytime sleepiness and intermittent periods of confusion.  I obtained additional historical information from wife at bedside.  Clinical Laboratory Tests Ordered, included CBC, Metabolic panel, and single troponin . Review indicates normal finding. Radiologic Tests Ordered, included chest.  I independently Visualized: Radiograph images, which show no infiltrate or  edema   Critical Interventions-clinical evaluation, laboratory testing, radiography, observation and reassessment  After These Interventions, the Patient was reevaluated and was found with fairly classic sleep apnea findings, and secondary symptoms.  Doubt ACS, PE or pneumonia.  Doubt CVA.  Patient has had some mild allergy symptoms recently, which may benefit from a course of prednisone.  He has a body habitus very concerning for sleep apnea.  He will likely require CPAP.  Amatory referral placed to neurology for that.  CRITICAL CARE-no Performed by: Daleen Bo  Nursing Notes Reviewed/ Care Coordinated Applicable Imaging Reviewed Interpretation of Laboratory Data incorporated into ED treatment  The patient appears reasonably screened and/or stabilized for discharge and I doubt any other medical condition or other [facility] requiring further screening, evaluation, or treatment in the ED at this time prior to discharge.  Plan: Home Medications-continue usual, hold diclofenac; Home Treatments-work on sleep hygiene; return here if the recommended treatment, does not improve the symptoms; Recommended follow up-neurology follow-up ASAP, PCP, PR     Final Clinical Impression(s) / ED Diagnoses Final diagnoses:  Sleep apnea, unspecified type  Nonspecific chest pain  Insomnia, unspecified type  Daytime sleepiness    Rx / DC Orders ED Discharge Orders          Ordered    Ambulatory referral to Neurology       Comments: An appointment is requested in approximately: 1 week, for evaluation of likely sleep apnea   08/14/21 1723    predniSONE (DELTASONE) 20 MG tablet  2 times daily        08/14/21 1724             Daleen Bo, MD 08/14/21 1729

## 2021-08-14 NOTE — ED Provider Notes (Signed)
Emergency Medicine Provider Triage Evaluation Note  Carl Beltran , a 45 y.o. male  was evaluated in triage.  Pt complains of chest pain that began this morning described as sharp sensation to his chest.  Evaluated by orthopedics approximately 2 weeks ago, reports he was given a prescription for pain medication, states that since has been taking this medication he has been more sleepy, is like he is falling asleep and cannot stay awake.  Review of Systems  Positive: Chest pain, generalized weakness Negative: Fever, headache  Physical Exam  BP (!) 144/95 (BP Location: Left Arm)   Pulse (!) 111   Temp 98.1 F (36.7 C) (Oral)   Resp 18   SpO2 98%  Gen:   Awake, no distress  Resp:  Normal effort  MSK:   Moves extremities without difficulty  Other:    Medical Decision Making  Medically screening exam initiated at 2:44 PM.  Appropriate orders placed.  EOGHAN BELCHER was informed that the remainder of the evaluation will be completed by another provider, this initial triage assessment does not replace that evaluation, and the importance of remaining in the ED until their evaluation is complete.  Patient prescribed Diclofenac by Ortho reports he has been taking 2 tablets twice a day instead of 1 tablet, states that he feels this medication is making harder for him to stay awake.  Also had 1 episode of chest pain which lasted for an hour with shortness of breath and dizziness prior to arrival.  No previous cardiac history.   Janeece Fitting, PA-C 08/14/21 1451    Carmin Muskrat, MD 08/14/21 [phone]

## 2021-08-17 ENCOUNTER — Ambulatory Visit: Payer: Managed Care, Other (non HMO) | Admitting: Neurology

## 2021-08-17 ENCOUNTER — Encounter: Payer: Self-pay | Admitting: Neurology

## 2021-08-17 VITALS — BP 131/81 | HR 109 | Ht 70.0 in | Wt 229.8 lb

## 2021-08-17 DIAGNOSIS — G4719 Other hypersomnia: Secondary | ICD-10-CM | POA: Diagnosis not present

## 2021-08-17 DIAGNOSIS — R0683 Snoring: Secondary | ICD-10-CM | POA: Diagnosis not present

## 2021-08-17 DIAGNOSIS — R519 Headache, unspecified: Secondary | ICD-10-CM | POA: Diagnosis not present

## 2021-08-17 DIAGNOSIS — E669 Obesity, unspecified: Secondary | ICD-10-CM

## 2021-08-17 DIAGNOSIS — E66811 Obesity, class 1: Secondary | ICD-10-CM

## 2021-08-17 DIAGNOSIS — R0681 Apnea, not elsewhere classified: Secondary | ICD-10-CM

## 2021-08-17 DIAGNOSIS — R351 Nocturia: Secondary | ICD-10-CM

## 2021-08-17 NOTE — Progress Notes (Signed)
Subjective:    Patient ID: Carl Beltran is a 45 y.o. male.  HPI    Carl Age, MD, PhD [facility] [address], [address] [address] [place], [place] [address]  Dear Dr. Larose Kells,  I saw your patient, Carl Beltran, upon your kind request in my sleep clinic today for initial consultation of his sleep disorder, in particular, concern for underlying obstructive sleep apnea.  The patient is accompanied by his wife today.  As you know, Carl Beltran is a 45 year old right-handed gentleman with an underlying medical history of ADHD, tachycardia, allergies, depression, ED, recurrent headaches (followed by Dr. Jaynee Eagles and Debbora Presto, NP in this office), and mild obesity, who reports snoring and excessive daytime somnolence.  I reviewed your office note from 04/29/2021.  He recently presented to the emergency room on 08/14/2021 with chest pain, heavy snoring, and difficulty sleeping.  His Epworth sleepiness score is 21 out of 24, fatigue severity score is 50 out of 63. He has a history of COVID infection a few months ago.  When he saw Dr. Jaynee Eagles in April 2021 he was started on nortriptyline and given a Medrol dose pack as well as a prescription for Maxalt.  He is currently not on any of these medications.  He was given a prescription for amitriptyline in June 2021 when he saw Debbora Presto, NP.  He is currently no longer on amitriptyline either. He reports that the headache medications were not effective.  He did not schedule a follow-up appointment.  He reports that his sleepiness has become worse in the recent past.  His wife has noticed apneic pauses for years, but worse in the past few months, his weight has been fluctuating.    Bedtime is around 10 PM, he does not have trouble going to sleep but has trouble maintaining sleep.  He is a restless sleeper.  He has no obvious family history of sleep apnea.   His Past Medical History Is Significant For: Past Medical History:  Diagnosis Date    Adult ADHD 2017   [place], predominantly inattentive presentation   Allergies    cats, hay   Attention deficit hyperactivity disorder (ADHD) 04/24/2016   Depressive personality disorder 2017   [facility]   Erectile dysfunction     His Past Surgical History Is Significant For: Past Surgical History:  Procedure Laterality Date   HERNIA REPAIR     umbilical, at Beltran 45 y/o   KNEE CARTILAGE SURGERY Bilateral 09/20/2018    His Family History Is Significant For: Family History  Problem Relation Beltran of Onset   Tongue cancer Father    Headache Brother    CAD Other        GF   Colon cancer Neg Hx    Prostate cancer Neg Hx    Diabetes Neg Hx    Sleep apnea Neg Hx     His Social History Is Significant For: Social History   Socioeconomic History   Marital status: Married    Spouse name: Not on file   Number of children: 2   Years of education: Not on file   Highest education level: Bachelor's degree (e.g., BA, AB, BS)  Occupational History   Occupation: IT    Employer: [employer]  Tobacco Use   Smoking status: Former    Years: 3.00    Types: Cigarettes, Cigars   Smokeless tobacco: Never   Tobacco comments:    smoked while in college  Vaping Use   Vaping Use: Never used  Substance and Sexual Activity   Alcohol use: Not Currently    Alcohol/week: 1.0 standard drink    Types: 1 Shots of liquor per week    Comment: one everyother week   Drug use: No   Sexual activity: Not on file  Other Topics Concern   Not on file  Social History Narrative   Household-- pt , wife ,2009, 2006      Lives at home with wife & children   Right handed   Caffeine: 1 cup/day   Social Determinants of Health   Financial Resource Strain: Not on file  Food Insecurity: Not on file  Transportation Needs: Not on file  Physical Activity: Not on file  Stress: Not on file  Social Connections: Not on file    His Allergies Are:  No Known Allergies:   His  Current Medications Are:  Outpatient Encounter Medications as of 08/17/2021  Medication Sig   predniSONE (DELTASONE) 20 MG tablet Take 1 tablet (20 mg total) by mouth 2 (two) times daily.   sildenafil (REVATIO) 20 MG tablet TAKE 3-4 TABLETS BY MOUTH AT BEDTIME AS NEEDED   No facility-administered encounter medications on file as of 08/17/2021.  :   Review of Systems:  Out of a complete 14 point review of systems, all are reviewed and negative with the exception of these symptoms as listed below:  Review of Systems  Neurological:        Pt is here for a sleep consult. Pt states he snores and has headaches in am and fatigue throughout  the day. Pt denies sleep study done before and no hypertension.    Ess:21 FSS:50   Objective:  Neurological Exam  Physical Exam Physical Examination:   Vitals:   08/17/21 1339  BP: 131/81  Pulse: (!) 109    General Examination: The patient is a very pleasant 45 y.o. male in no acute distress. He appears well-developed and well-nourished and well groomed.   HEENT: Normocephalic, atraumatic, pupils are equal, round and reactive to light, extraocular tracking is good without limitation to gaze excursion or nystagmus noted. Hearing is grossly intact. Face is symmetric with normal facial animation. Speech is clear with no dysarthria noted. There is no hypophonia. There is no lip, neck/head, jaw or voice tremor. Neck is supple with full range of passive and active motion. There are no carotid bruits on auscultation. Oropharynx exam reveals: mild mouth dryness, adequate dental hygiene and marked airway crowding, due to larger uvula, small airway entry and thicker soft palate.  Tonsils in place, approximately 1 or 2+ in size bilaterally.  Mallampati class IV.  Neck circumference of 19 3/8 inches.  He has a minimal overbite, slightly skewed teeth alignment.  Chest: Clear to auscultation without wheezing, rhonchi or crackles noted.  Heart: S1+S2+0, regular  and normal without murmurs, rubs or gallops noted.   Abdomen: Soft, non-tender and non-distended.  Extremities: There is no obvious edema in the distal legs.     Skin: Warm and dry without trophic changes noted.   Musculoskeletal: exam reveals no obvious joint deformities.   Neurologically:  Mental status: The patient is awake, alert and oriented in all 4 spheres. His immediate and remote memory, attention, language skills and fund of knowledge are appropriate. There is no evidence of aphasia, agnosia, apraxia or anomia. Speech is clear with normal prosody and enunciation. Thought process is linear. Mood is normal and affect is normal.  Cranial nerves  II - XII are as described above under HEENT exam.  Motor exam: Normal bulk, strength and tone is noted. There is no tremor, fine motor skills and coordination: grossly intact.  Cerebellar testing: No dysmetria or intention tremor. There is no truncal or gait ataxia.  Sensory exam: intact to light touch in the upper and lower extremities.  Gait, station and balance: He stands easily. No veering to one side is noted. No leaning to one side is noted. Posture is Beltran-appropriate and stance is narrow based. Gait shows normal stride length and normal pace. No problems turning are noted.   Assessment and Plan:  In summary, ZIAN DELAIR is a very pleasant 45 y.o.-year old male with an underlying medical history of ADHD, tachycardia, allergies, depression, ED, recurrent headaches (for which he has seen Dr. Jaynee Eagles and Debbora Presto, NP in this office), and mild obesity, whose history and physical exam are concerning for obstructive sleep apnea (OSA). I had a long chat with the patient and his wife about my findings and the diagnosis of OSA, its prognosis and treatment options. We talked about medical treatments, surgical interventions and non-pharmacological approaches. I explained in particular the risks and ramifications of untreated moderate to severe OSA,  especially with respect to developing cardiovascular disease down the Road, including congestive heart failure, difficult to treat hypertension, cardiac arrhythmias, or stroke. Even type 2 diabetes has, in part, been linked to untreated OSA. Symptoms of untreated OSA include daytime sleepiness, memory problems, mood irritability and mood disorder such as depression and anxiety, lack of energy, as well as recurrent headaches, especially morning headaches. We talked about trying to maintain a healthy lifestyle in general, as well as the importance of weight control.  I recommended the following at this time: sleep study.  I outlined the difference between a laboratory attended sleep study versus home sleep test.  I explained the sleep test procedure to the patient and also outlined possible surgical and non-surgical treatment options of OSA, including the use of a custom-made dental device (which would require a referral to a specialist dentist or oral surgeon), upper airway surgical options, such as traditional UPPP or a novel less invasive surgical option in the form of Inspire hypoglossal nerve stimulation (which would involve a referral to an ENT surgeon). I also explained the CPAP treatment option to the patient, who indicated that he would be willing to try CPAP if the need arises.  We will pick up our discussion after testing.  We will keep him posted as to his test results by phone call and plan a follow-up in this clinic accordingly.  I answered all their questions today and the patient and his wife were in agreement. Thank you very much for allowing me to participate in the care of this nice patient. If I can be of any further assistance to you please do not hesitate to call me at [phone].  Sincerely,   Carl Age, MD, PhD

## 2021-08-17 NOTE — Patient Instructions (Signed)
It was nice to meet you today. Based on your symptoms and your exam I believe you are at risk for obstructive sleep apnea (aka OSA), and I think we should proceed with a sleep study to determine whether you do or do not have OSA and how severe it is. Even, if you have mild OSA, I may want you to consider treatment with CPAP, as treatment of even borderline or mild sleep apnea can result and improvement of symptoms such as sleep disruption, daytime sleepiness, nighttime bathroom breaks, restless leg symptoms, improvement of headache syndromes, even improved mood disorder.   As explained, an attended sleep study meaning you get to stay overnight in the sleep lab, lets us monitor sleep-related behaviors such as sleep talking and leg movements in sleep, in addition to monitoring for sleep apnea.  A home sleep test is a screening tool for sleep apnea only, and unfortunately does not help with any other sleep-related diagnoses.  Please remember, the long-term risks and ramifications of untreated moderate to severe obstructive sleep apnea are: increased Cardiovascular disease, including congestive heart failure, stroke, difficult to control hypertension, treatment resistant obesity, arrhythmias, especially irregular heartbeat commonly known as A. Fib. (atrial fibrillation); even type 2 diabetes has been linked to untreated OSA.   Sleep apnea can cause disruption of sleep and sleep deprivation in most cases, which, in turn, can cause recurrent headaches, problems with memory, mood, concentration, focus, and vigilance. Most people with untreated sleep apnea report excessive daytime sleepiness, which can affect their ability to drive. Please do not drive if you feel sleepy. Patients with sleep apnea can also develop difficulty initiating and maintaining sleep (aka insomnia).   Having sleep apnea may increase your risk for other sleep disorders, including involuntary behaviors sleep such as sleep terrors, sleep  talking, sleepwalking.    Having sleep apnea can also increase your risk for restless leg syndrome and leg movements at night.   Please note that untreated obstructive sleep apnea may carry additional perioperative morbidity. Patients with significant obstructive sleep apnea (typically, in the moderate to severe degree) should receive, if possible, perioperative PAP (positive airway pressure) therapy and the surgeons and particularly the anesthesiologists should be informed of the diagnosis and the severity of the sleep disordered breathing.   I will likely see you back after your sleep study to go over the test results and where to go from there. We will call you after your sleep study to advise about the results (most likely, you will hear from Megan, my nurse) and to set up an appointment at the time, as necessary.    Our sleep lab administrative assistant will call you to schedule your sleep study and give you further instructions, regarding the check in process for the sleep study, arrival time, what to bring, when you can expect to leave after the study, etc., and to answer any other logistical questions you may have. If you don't hear back from her by about 2 weeks from now, please feel free to call her direct line at [phone] or you can call our general clinic number, or email us through My Chart.    

## 2021-08-23 ENCOUNTER — Encounter: Payer: Self-pay | Admitting: Internal Medicine

## 2021-08-24 ENCOUNTER — Other Ambulatory Visit: Payer: Self-pay

## 2021-08-25 ENCOUNTER — Encounter: Payer: Self-pay | Admitting: Internal Medicine

## 2021-08-25 ENCOUNTER — Ambulatory Visit: Payer: Managed Care, Other (non HMO) | Admitting: Internal Medicine

## 2021-08-25 VITALS — BP 118/84 | HR 106 | Temp 98.1°F | Resp 18 | Ht 70.0 in | Wt 227.5 lb

## 2021-08-25 DIAGNOSIS — R052 Subacute cough: Secondary | ICD-10-CM

## 2021-08-25 DIAGNOSIS — J40 Bronchitis, not specified as acute or chronic: Secondary | ICD-10-CM | POA: Diagnosis not present

## 2021-08-25 MED ORDER — ALBUTEROL SULFATE HFA 108 (90 BASE) MCG/ACT IN AERS: 2.0000 | INHALATION_SPRAY | Freq: Four times a day (QID) | RESPIRATORY_TRACT | 2 refills | Status: DC | PRN

## 2021-08-25 MED ORDER — PREDNISONE 10 MG PO TABS: ORAL_TABLET | ORAL | 0 refills | Status: DC

## 2021-08-25 MED ORDER — AZITHROMYCIN 250 MG PO TABS: ORAL_TABLET | ORAL | 0 refills | Status: DC

## 2021-08-25 NOTE — Assessment & Plan Note (Signed)
Cough, suspected bronchitis. The patient was Dx with COVID 07/18/2021.  Treated with Molnupiravir After that he felt much better except for cough. Since then the cough is gradually getting worse and is productive often times, he also has some wheezing. Went to the ER a few days ago, see HPI, work-up negative.  They suspected sleep apnea, was referred to neuro. Cough could be postviral versus intercurrent bronchitis.  He has a lot of fluid behind the TMs. Plan: Zithromax, second round of prednisone ( got one @ the ER), albuterol if wheezing.  Call if not gradually better R/O OSA: Saw neurology 08/17/2021, was evaluated for OSA, sleep study pending

## 2021-08-25 NOTE — Progress Notes (Signed)
Subjective:    Patient ID: Carl Beltran, male    DOB: 1976/08/22, 45 y.o.   MRN: [id number]  DOS:  08/25/2021 Type of visit - description: ER follow-up  Went to the ER with multiple symptoms on  08/14/2021: Chest pain, confusion, difficulty talking, memory lapses, difficulty sleeping, cough. Work-up: BMP, CBC, troponin, chest x-ray, EKG: Labs okay, EKG sinus tachycardia. Was Rx prednisone, was referred to neurology for sleep apnea work-up as they were suspicious of this diagnosis.   He is here today for further evaluation: Confusion, difficulty with speech is better.  He continue with cough, this is going on for few weeks, gradually worse.  Cough is random, at times intense, half of the time he brings up greenish sputum with traces of blood but no frank hemoptysis.  He denies fever chills No sinus pain or congestion No substernal chest pain, shortness of breath or edema. No GERD. When asked, admits to a wheezing sound from his chest.    Review of Systems See above   Past Medical History:  Diagnosis Date   Adult ADHD 2017   [place], predominantly inattentive presentation   Allergies    cats, hay   Attention deficit hyperactivity disorder (ADHD) 04/24/2016   Depressive personality disorder 2017   [facility]   Erectile dysfunction     Past Surgical History:  Procedure Laterality Date   HERNIA REPAIR     umbilical, at age 35 y/o   KNEE CARTILAGE SURGERY Bilateral 09/20/2018    Allergies as of 08/25/2021   No Known Allergies      Medication List        Accurate as of August 25, 2021  2:41 PM. If you have any questions, ask your nurse or doctor.          albuterol 108 (90 Base) MCG/ACT inhaler Commonly known as: VENTOLIN HFA Inhale 2 puffs into the lungs every 6 (six) hours as needed for wheezing or shortness of breath. Started by: Kathlene November, MD   azithromycin 250 MG tablet Commonly known as: Zithromax Z-Pak 2 tabs a day  the first day, then 1 tab a day x 4 days Started by: Kathlene November, MD   predniSONE 10 MG tablet Commonly known as: DELTASONE 4 tablets x 2 days, 3 tabs x 2 days, 2 tabs x 2 days, 1 tab x 2 days What changed:  medication strength how much to take how to take this when to take this additional instructions Changed by: Kathlene November, MD   sildenafil 20 MG tablet Commonly known as: REVATIO TAKE 3-4 TABLETS BY MOUTH AT BEDTIME AS NEEDED           Objective:   Physical Exam BP 118/84 (BP Location: Left Arm, Patient Position: Sitting, Cuff Size: Normal)   Pulse (!) 106   Temp 98.1 F (36.7 C) (Oral)   Resp 18   Ht 5\' 10"  (1.778 m)   Wt 227 lb 8 oz (103.2 kg)   SpO2 98%   BMI 32.64 kg/m  General:   Well developed, NAD, BMI noted. HEENT:  Normocephalic . Face symmetric, atraumatic TMs: Both are moderately bulging and slightly red with no discharge Throat: Very crowded, no redness. Nose with mild congestion. Lungs:  CTA B Normal respiratory effort, no intercostal retractions, no accessory muscle use. Heart: RRR,  no murmur.  Lower extremities: no pretibial edema bilaterally  Skin: Not pale. Not jaundice Neurologic:  alert & oriented X3.  Speech normal, gait appropriate  for age and unassisted Psych--  Cognition and judgment appear intact.  Cooperative with normal attention span and concentration.  Behavior appropriate. No anxious or depressed appearing.      Assessment    Assessment ADHD (confirmed by psychology 03-2016) Depression (dx by psych 03-2016) ED Covid infex 09-2019, 07-2021   PLAN: Cough, suspected bronchitis. The patient was Dx with COVID 07/18/2021.  Treated with Molnupiravir After that he felt much better except for cough. Since then the cough is gradually getting worse and is productive often times, he also has some wheezing. Went to the ER a few days ago, see HPI, work-up negative.  They suspected sleep apnea, was referred to neuro. Cough could be  postviral versus intercurrent bronchitis.  He has a lot of fluid behind the TMs. Plan: Zithromax, second round of prednisone ( got one @ the ER), albuterol if wheezing.  Call if not gradually better R/O OSA: Saw neurology 08/17/2021, was evaluated for OSA, sleep study pending     This visit occurred during the SARS-CoV-2 public health emergency.  Safety protocols were in place, including screening questions prior to the visit, additional usage of staff PPE, and extensive cleaning of exam room while observing appropriate contact time as indicated for disinfecting solutions.

## 2021-08-25 NOTE — Patient Instructions (Addendum)
When better recommend to proceed with flu shot and covid booster at your convenience.  For cough: Robitussin-DM as needed Zithromax, an antibiotic Prednisone for few days, see prescription If you are wheezing, have severe cough: Use albuterol 2 puffs every 6 hours as needed  Call if not gradually better in the next 2 weeks.  When you feel better, call for a lab appointment early in the morning fasting.  We will check your cholesterol and testosterone as you requested during your physical exam.

## 2021-08-31 ENCOUNTER — Ambulatory Visit (INDEPENDENT_AMBULATORY_CARE_PROVIDER_SITE_OTHER): Payer: Managed Care, Other (non HMO) | Admitting: Neurology

## 2021-08-31 DIAGNOSIS — G4733 Obstructive sleep apnea (adult) (pediatric): Secondary | ICD-10-CM | POA: Diagnosis not present

## 2021-08-31 DIAGNOSIS — R351 Nocturia: Secondary | ICD-10-CM

## 2021-08-31 DIAGNOSIS — E669 Obesity, unspecified: Secondary | ICD-10-CM

## 2021-08-31 DIAGNOSIS — R0681 Apnea, not elsewhere classified: Secondary | ICD-10-CM

## 2021-08-31 DIAGNOSIS — R0683 Snoring: Secondary | ICD-10-CM

## 2021-08-31 DIAGNOSIS — G4719 Other hypersomnia: Secondary | ICD-10-CM

## 2021-08-31 DIAGNOSIS — R519 Headache, unspecified: Secondary | ICD-10-CM

## 2021-09-05 NOTE — Progress Notes (Signed)
See procedure note.

## 2021-09-06 HISTORY — PX: SHOULDER ARTHROSCOPY WITH BICEPS TENDON REPAIR: SHX5674

## 2021-09-06 NOTE — Procedures (Signed)
     [facility]  HOME SLEEP TEST (Watch PAT) REPORT  STUDY DATE: 08/31/2021  DOB: July 14, 1976  MRN: [id number]  ORDERING CLINICIAN: Star Age, MD, PhD   REFERRING CLINICIAN: Colon Branch, MD   CLINICAL INFORMATION/HISTORY: 45 year old right-handed gentleman with an underlying medical history of ADHD, tachycardia, allergies, depression, ED, recurrent headaches (followed by Dr. Jaynee Eagles and Debbora Presto, NP in this office), and mild obesity, who reports snoring and excessive daytime somnolence.   Epworth sleepiness score: 21/24.  BMI: 32.8 kg/m  FINDINGS:   Sleep Summary:   Total Recording Time (hours, min): 7 hours, 24 minutes  Total Sleep Time (hours, min): 5 hours, 39 minutes   Percent REM (%):    10.6%   Respiratory Indices:   Calculated pAHI (per hour):  78.4/hour         REM pAHI:    46.1/hour       NREM pAHI: 82.5/hour  Central AHI: 12/h  Oxygen Saturation Statistics:    Oxygen Saturation (%) Mean: 94%   Minimum oxygen saturation (%):                 77%   O2 Saturation Range (%): 77-99%    O2 Saturation (minutes) <=88%: 8.3 min  Pulse Rate Statistics:   Pulse Mean (bpm):    83/min    Pulse Range (37-125/min)   IMPRESSION: OSA (obstructive sleep apnea), severe  RECOMMENDATION:  This home sleep test demonstrates severe obstructive sleep apnea with a total AHI of 78.4/hour and O2 nadir of 77%.  Snoring was detected, and appeared to be in the loud range throughout the night. Treatment with positive airway pressure is highly recommended. This will require - ideally - a full night CPAP titration study for proper treatment settings, O2 monitoring and mask fitting. For now, the patient will be advised to proceed with an autoPAP titration/trial at home.  A laboratory attended titration study can be considered in the future for optimization of his treatment and better tolerance of therapy.  Alternative treatment options are limited secondary to  the severity of the patient's sleep disordered breathing; concomitant weight loss is recommended.  Please note, that untreated obstructive sleep apnea may carry additional perioperative morbidity. Patients with significant obstructive sleep apnea should receive perioperative PAP therapy and the surgeons and particularly the anesthesiologist should be informed of the diagnosis and the severity of the sleep disordered breathing. The patient should be cautioned not to drive, work at heights, or operate dangerous or heavy equipment when tired or sleepy. Review and reiteration of good sleep hygiene measures should be pursued with any patient. Other causes of the patient's symptoms, including circadian rhythm disturbances, an underlying mood disorder, medication effect and/or an underlying medical problem cannot be ruled out based on this test. Clinical correlation is recommended. The patient and his referring provider will be notified of the test results. The patient will be seen in follow up in sleep clinic at [facility].  I certify that I have reviewed the raw data recording prior to the issuance of this report in accordance with the standards of the American Academy of Sleep Medicine (AASM).   INTERPRETING PHYSICIAN:   Star Age, MD, PhD  Board Certified in Neurology and Sleep Medicine  [facility] [address], [place], [place] [address] [phone]

## 2021-09-06 NOTE — Addendum Note (Signed)
Addended by: Star Age on: 09/06/2021 04:01 PM   Modules accepted: Orders

## 2021-09-07 ENCOUNTER — Telehealth: Payer: Self-pay | Admitting: Neurology

## 2021-09-07 NOTE — Telephone Encounter (Signed)
-----   Message from Star Age, MD sent at 09/06/2021  4:00 PM EDT ----- Patient referred by Dr. Larose Kells for sleep apnea evaluation, he sees Dr. Jaynee Eagles and Amy for headache management.  I saw him on 08/17/2021, patient had a HST on 08/31/2021.    Please call and notify the patient that the recent home sleep test showed obstructive sleep apnea in the severe range. I recommend treatment for this in the form of autoPAP, which means, that we don't have to bring him in for a sleep study with CPAP, but will let him start using a so called autoPAP machine at home, through a DME company (of his choice, or as per insurance requirement). The DME representative will fit the patient with a mask of choice, educate him on how to use the machine, how to put the mask on, etc. I have placed an order in the chart. Please send the order to a local DME, talk to patient, send report to referring MD. Please also reinforce the need for compliance with treatment. We will need a FU in sleep clinic for 10 weeks post-PAP set up, please arrange that with me or one of our NPs. Thanks,   Star Age, MD, PhD [facility])

## 2021-09-07 NOTE — Telephone Encounter (Signed)
I called Carl Beltran. I advised Carl Beltran that Dr. Rexene Alberts reviewed their sleep study results and found that Carl Beltran severe sleep apnea. Dr. Rexene Alberts recommends that Carl Beltran starts auto CPAP. I reviewed PAP compliance expectations with the Carl Beltran. Carl Beltran is agreeable to starting a CPAP. I advised Carl Beltran that an order will be sent to a DME, [facility], and [facility] will call the Carl Beltran within about one week after they file with the Carl Beltran's insurance. [facility] will show the Carl Beltran how to use the machine, fit for masks, and troubleshoot the CPAP if needed. A follow up appt was made for insurance purposes with Dr. Rexene Alberts on 12/14/2021 at 7:45 am. Carl Beltran verbalized understanding to arrive 15 minutes early and bring their CPAP. A letter with all of this information in it will be mailed to the Carl Beltran as a reminder. I verified with the Carl Beltran that the address we have on file is correct. Carl Beltran verbalized understanding of results. Carl Beltran had no questions at this time but was encouraged to call back if questions arise. I have sent the order to [facility] and have received confirmation that they have received the order.

## 2021-11-10 ENCOUNTER — Encounter: Payer: Self-pay | Admitting: Internal Medicine

## 2021-12-06 ENCOUNTER — Other Ambulatory Visit: Payer: Self-pay

## 2021-12-06 ENCOUNTER — Ambulatory Visit (AMBULATORY_SURGERY_CENTER): Payer: Managed Care, Other (non HMO) | Admitting: *Deleted

## 2021-12-06 VITALS — Ht 70.0 in | Wt 225.0 lb

## 2021-12-06 DIAGNOSIS — Z1211 Encounter for screening for malignant neoplasm of colon: Secondary | ICD-10-CM

## 2021-12-06 NOTE — Progress Notes (Signed)

## 2021-12-14 ENCOUNTER — Ambulatory Visit: Payer: Managed Care, Other (non HMO) | Admitting: Neurology

## 2021-12-14 ENCOUNTER — Encounter: Payer: Self-pay | Admitting: Neurology

## 2021-12-14 VITALS — BP 128/84 | HR 86 | Ht 70.0 in | Wt 234.2 lb

## 2021-12-14 DIAGNOSIS — G4733 Obstructive sleep apnea (adult) (pediatric): Secondary | ICD-10-CM | POA: Diagnosis not present

## 2021-12-14 DIAGNOSIS — Z789 Other specified health status: Secondary | ICD-10-CM

## 2021-12-14 NOTE — Progress Notes (Signed)
Subjective:    Patient ID: Carl Beltran is a 46 y.o. male.  HPI    Interim history:   Mr. Klingensmith is a 46 year old right-handed gentleman with an underlying medical history of ADHD, tachycardia, allergies, depression, ED, recurrent headaches (followed by Dr. Jaynee Eagles and Debbora Presto, NP in this office), and mild obesity, who presents for follow-up consultation of his obstructive sleep apnea after interim testing and starting AutoPap therapy.  The patient is unaccompanied today.  I first met him at the request of his primary care physician on 08/17/2021, at which time he reported snoring and excessive daytime somnolence.  I advised him to proceed with sleep testing.  He had a home sleep test on 08/31/2021 which indicated severe obstructive sleep apnea with an AHI of 78.4/h, O2 nadir 77%.  Fairly loud snoring was detected throughout the night.  He was advised to start AutoPap therapy.  His set up date was 09/21/2021.  He has a Personnel officer.  Today, 12/14/2021: I reviewed his AutoPap compliance data from 11/14/2021 to 12/13/2021, which is a total of 30 days, during which time he used his machine only 4 days with percent use days greater than 4 hours at 0%, indicating noncompliance.  His compliance between 09/21/2021 through current showed 29 out of 84 days of usage, percent use days greater than 4 hours at 7.1% only.  He did try to use his machine more consistently in the first 12 days.  He reports that the pressure is too high.  He has not been in touch with his DME company regarding a pressure change or called [place] or emailed [place], unfortunately.  He is encouraged to get in touch with his DME provider.  I provided the phone number.  He is also encouraged to always get in touch with [place] if he has trouble with his machine.  He has not noticed any benefit yet but he admits that he has not been using it much.  He is willing to try it to use it at a lower pressure.  He reports that his home sleep test barely slept.  He  reports that he had insomnia that night.  He is advised that his desaturations did go into the 70s.  If he has desaturations during wakefulness, he may need to be evaluated by a pulmonologist for an underlying cause, he is encouraged to talk to his primary care about evaluation if need be.  He is working on weight loss.  He had shoulder surgery in November 2022 and is now getting back on exercising more.   The patient's allergies, current medications, family history, past medical history, past social history, past surgical history and problem list were reviewed and updated as appropriate.   Previously:   08/17/21: (He) reports snoring and excessive daytime somnolence.  I reviewed your office note from 04/29/2021.  He recently presented to the emergency room on 08/14/2021 with chest pain, heavy snoring, and difficulty sleeping.  His Epworth sleepiness score is 21 out of 24, fatigue severity score is 50 out of 63. He has a history of COVID infection a few months ago.  When he saw Dr. Jaynee Eagles in April 2021 he was started on nortriptyline and given a Medrol dose pack as well as a prescription for Maxalt.  He is currently not on any of these medications.  He was given a prescription for amitriptyline in June 2021 when he saw Debbora Presto, NP.  He is currently no longer on amitriptyline either. He reports that  the headache medications were not effective.  He did not schedule a follow-up appointment.   He reports that his sleepiness has become worse in the recent past.  His wife has noticed apneic pauses for years, but worse in the past few months, his weight has been fluctuating.     Bedtime is around 10 PM, he does not have trouble going to sleep but has trouble maintaining sleep.  He is a restless sleeper.  He has no obvious family history of sleep apnea.  His Past Medical History Is Significant For: Past Medical History:  Diagnosis Date   Adult ADHD 2017   [place], predominantly inattentive  presentation   Allergies    cats, hay   Attention deficit hyperactivity disorder (ADHD) 04/24/2016   Depressive personality disorder 2017   [facility]   Erectile dysfunction    Sleep apnea    NO CPAP    His Past Surgical History Is Significant For: Past Surgical History:  Procedure Laterality Date   COLONOSCOPY  11/29/2006   Dr.Gessner   HERNIA REPAIR     umbilical, at age 28 y/o   KNEE CARTILAGE SURGERY Bilateral 09/20/2018   SHOULDER ARTHROSCOPY WITH BICEPS TENDON REPAIR Left 09/2021    His Family History Is Significant For: Family History  Problem Relation Age of Onset   Tongue cancer Father    Headache Brother    CAD Other        GF   Colon cancer Neg Hx    Prostate cancer Neg Hx    Diabetes Neg Hx    Sleep apnea Neg Hx    Colon polyps Neg Hx    Stomach cancer Neg Hx    Rectal cancer Neg Hx     His Social History Is Significant For: Social History   Socioeconomic History   Marital status: Married    Spouse name: Not on file   Number of children: 2   Years of education: Not on file   Highest education level: Bachelor's degree (e.g., BA, AB, BS)  Occupational History   Occupation: IT    Employer: [employer]  Tobacco Use   Smoking status: Former    Years: 3.00    Types: Cigarettes, Cigars   Smokeless tobacco: Never   Tobacco comments:    smoked while in college   Vaping Use   Vaping Use: Never used  Substance and Sexual Activity   Alcohol use: Not Currently    Comment: one everyother week   Drug use: No   Sexual activity: Not on file  Other Topics Concern   Not on file  Social History Narrative   Household-- pt , wife ,2009, 2006      Lives at home with wife & children   Right handed   Caffeine: 1 cup/day   Social Determinants of Health   Financial Resource Strain: Not on file  Food Insecurity: Not on file  Transportation Needs: Not on file  Physical Activity: Not on file  Stress: Not on file  Social Connections: Not on  file    His Allergies Are:  No Known Allergies:   His Current Medications Are:  Outpatient Encounter Medications as of 12/14/2021  Medication Sig   COLLAGEN PO Take by mouth.   Multiple Vitamin (MULTIVITAMIN ADULT PO) Take by mouth.   Omega-3 Fatty Acids (FISH OIL PO) Take by mouth.   Probiotic Product (PROBIOTIC DAILY PO) Take by mouth.   sildenafil (REVATIO) 20 MG tablet TAKE 3-4 TABLETS  BY MOUTH AT BEDTIME AS NEEDED   No facility-administered encounter medications on file as of 12/14/2021.  :  Review of Systems:  Out of a complete 14 point review of systems, all are reviewed and negative with the exception of these symptoms as listed below:  Review of Systems  Neurological:        Pt is here for follow up with CPAP. Pt states  its too much  air pressure in mask . PT states he can only wear it for 2 hours at the most    Objective:  Neurological Exam  Physical Exam Physical Examination:   Vitals:   12/14/21 0755  BP: 128/84  Pulse: 86    General Examination: The patient is a very pleasant 46 y.o. male in no acute distress. He appears well-developed and well groomed.   HEENT: Normocephalic, atraumatic, pupils are equal, round and reactive to light, extraocular tracking is well-preserved.  Face is symmetric, normal facial animation, speech is clear without dysarthria.  No carotid bruits.  Airway examination reveals mild to moderate mouth dryness, fairly significant airway crowding.  Tongue protrudes centrally and palate elevates symmetrically.    Chest: Clear to auscultation without wheezing, rhonchi or crackles noted.   Heart: S1+S2+0, regular and normal without murmurs, rubs or gallops noted.    Abdomen: Soft, non-tender and non-distended.   Extremities: There is no obvious edema in the distal legs.      Skin: Warm and dry without trophic changes noted.    Musculoskeletal: exam reveals no obvious joint deformities.    Neurologically:  Mental status: The patient is  awake, alert and oriented in all 4 spheres. His immediate and remote memory, attention, language skills and fund of knowledge are appropriate. There is no evidence of aphasia, agnosia, apraxia or anomia. Speech is clear with normal prosody and enunciation. Thought process is linear. Mood is normal and affect is normal.  Cranial nerves II - XII are as described above under HEENT exam.  Motor exam: Normal bulk, strength and tone is noted. There is no tremor, fine motor skills and coordination: grossly intact.  Cerebellar testing: No dysmetria or intention tremor. There is no truncal or gait ataxia.  Sensory exam: intact to light touch.  Gait, station and balance: He stands easily. No veering to one side is noted. No leaning to one side is noted. Posture is age-appropriate and stance is narrow based. Gait shows normal stride length and normal pace. No problems turning are noted.    Assessment and Plan:  In summary, KORON GODEAUX is a very pleasant 46 year old male with an underlying medical history of ADHD, tachycardia, allergies, depression, ED, recurrent headaches (for which he has seen Dr. Jaynee Eagles and Debbora Presto, NP in this office), and obesity, who presents for follow-up consultation of his obstructive sleep apnea.  She has been in the severe range by home sleep testing on 08/31/2021 with an AHI of 78.4/h and O2 nadir of 77%, snoring was in the lower range throughout the night.  Of note, the patient indicates that he did not sleep well that night or hardly at all.  Nevertheless, snoring was captured on the home sleep test equipment as well as significant desaturations.  He is advised to talk to his primary care about evaluation of an underlying lung disease as he may have had some desaturations during wakefulness them.  He is encouraged to continue to work on weight loss and treat his sleep apnea with an AutoPap machine for now.  We can consider alternative treatment options down the road including surgical  options or bringing him in for a formal titration.  For now, since he is struggling with tolerance of the pressure, I suggested we reduce his pressure range from currently 6 cm to 15 cm, down to 4 cm to 12 cm.  He is advised to get in touch with his DME provider for additional help with adjusting the humidity level.  He is also advised to talk to them about his compliance as he has not been able to be consistent with his AutoPap machine.  He is advised to follow-up to see one of our nurse practitioners in about 3 to 4 months, sooner if needed.  I provided him with the phone number for [facility] at this visit as well.  I suggested we proceed with an overnight pulse oximetry test once he is on the new settings of his pressure.  I ordered a pressure change as well and is an ONO therapy.  We will call him with his results or email him with a MyChart message.  I answered all his questions today and he was in agreement with our plan.  I spent 30 minutes in total face-to-face time and in reviewing records during pre-charting, more than 50% of which was spent in counseling and coordination of care, reviewing test results, reviewing medications and treatment regimen and/or in discussing or reviewing the diagnosis of OSA, the prognosis and treatment options. Pertinent laboratory and imaging test results that were available during this visit with the patient were reviewed by me and considered in my medical decision making (see chart for details).

## 2021-12-16 ENCOUNTER — Encounter: Payer: Self-pay | Admitting: Internal Medicine

## 2021-12-19 NOTE — Progress Notes (Signed)
Message Received: 4 days ago Denyse Amass, RN; Vanessa Ralphs got it      Previous Messages   ----- Message -----  From: Brandon Melnick, RN  Sent: 12/14/2021  10:57 AM EST  To: Ocie Bob, *   New orders in [place].     Do ONO x 1 night while patien on autopap at home fax results to Dr. Rexene Alberts.     Change autopap to minimum pressure of 4 cm, max pressure of 12cm for better tolerance.     Carl Beltran  Male, 46 y.o., 1976-09-27  MRN:  [id number]  Thanks SY

## 2021-12-20 ENCOUNTER — Encounter: Payer: Managed Care, Other (non HMO) | Admitting: Internal Medicine

## 2021-12-20 ENCOUNTER — Ambulatory Visit (AMBULATORY_SURGERY_CENTER): Payer: Managed Care, Other (non HMO) | Admitting: Internal Medicine

## 2021-12-20 ENCOUNTER — Encounter: Payer: Self-pay | Admitting: Internal Medicine

## 2021-12-20 VITALS — BP 138/86 | HR 97 | Temp 97.1°F | Resp 14 | Ht 70.0 in | Wt 225.0 lb

## 2021-12-20 DIAGNOSIS — D128 Benign neoplasm of rectum: Secondary | ICD-10-CM

## 2021-12-20 DIAGNOSIS — K635 Polyp of colon: Secondary | ICD-10-CM | POA: Diagnosis not present

## 2021-12-20 DIAGNOSIS — K621 Rectal polyp: Secondary | ICD-10-CM

## 2021-12-20 DIAGNOSIS — D129 Benign neoplasm of anus and anal canal: Secondary | ICD-10-CM

## 2021-12-20 DIAGNOSIS — Z1211 Encounter for screening for malignant neoplasm of colon: Secondary | ICD-10-CM

## 2021-12-20 DIAGNOSIS — D127 Benign neoplasm of rectosigmoid junction: Secondary | ICD-10-CM | POA: Diagnosis not present

## 2021-12-20 DIAGNOSIS — D125 Benign neoplasm of sigmoid colon: Secondary | ICD-10-CM

## 2021-12-20 MED ORDER — SODIUM CHLORIDE 0.9 % IV SOLN
500.0000 mL | Freq: Once | INTRAVENOUS | Status: DC
Start: 2021-12-20 — End: 2021-12-20

## 2021-12-20 NOTE — Progress Notes (Signed)
VS- Donna Thomas  Pt's states no medical or surgical changes since previsit or office visit.  

## 2021-12-20 NOTE — Patient Instructions (Signed)
Handout on Hemorrhoids provided.   Await pathology results.   YOU HAD AN ENDOSCOPIC PROCEDURE TODAY AT [facility]:   Refer to the procedure report that was given to you for any specific questions about what was found during the examination.  If the procedure report does not answer your questions, please call your gastroenterologist to clarify.  If you requested that your care partner not be given the details of your procedure findings, then the procedure report has been included in a sealed envelope for you to review at your convenience later.  YOU SHOULD EXPECT: Some feelings of bloating in the abdomen. Passage of more gas than usual.  Walking can help get rid of the air that was put into your GI tract during the procedure and reduce the bloating. If you had a lower endoscopy (such as a colonoscopy or flexible sigmoidoscopy) you may notice spotting of blood in your stool or on the toilet paper. If you underwent a bowel prep for your procedure, you may not have a normal bowel movement for a few days.  Please Note:  You might notice some irritation and congestion in your nose or some drainage.  This is from the oxygen used during your procedure.  There is no need for concern and it should clear up in a day or so.  SYMPTOMS TO REPORT IMMEDIATELY:  Following lower endoscopy (colonoscopy or flexible sigmoidoscopy):  Excessive amounts of blood in the stool  Significant tenderness or worsening of abdominal pains  Swelling of the abdomen that is new, acute  Fever of 100F or higher  For urgent or emergent issues, a gastroenterologist can be reached at any hour by calling [phone]. Do not use MyChart messaging for urgent concerns.    DIET:  We do recommend a small meal at first, but then you may proceed to your regular diet.  Drink plenty of fluids but you should avoid alcoholic beverages for 24 hours.  ACTIVITY:  You should plan to take it easy for the rest of today and you  should NOT DRIVE or use heavy machinery until tomorrow (because of the sedation medicines used during the test).    FOLLOW UP: Our staff will call the number listed on your records 48-72 hours following your procedure to check on you and address any questions or concerns that you may have regarding the information given to you following your procedure. If we do not reach you, we will leave a message.  We will attempt to reach you two times.  During this call, we will ask if you have developed any symptoms of COVID 19. If you develop any symptoms (ie: fever, flu-like symptoms, shortness of breath, cough etc.) before then, please call [phone].  If you test positive for Covid 19 in the 2 weeks post procedure, please call and report this information to [place].    If any biopsies were taken you will be contacted by phone or by letter within the next 1-3 weeks.  Please call us at [phone] if you have not heard about the biopsies in 3 weeks.    SIGNATURES/CONFIDENTIALITY: You and/or your care partner have signed paperwork which will be entered into your electronic medical record.  These signatures attest to the fact that that the information above on your After Visit Summary has been reviewed and is understood.  Full responsibility of the confidentiality of this discharge information lies with you and/or your care-partner.

## 2021-12-20 NOTE — Progress Notes (Signed)
Called to room to assist during endoscopic procedure.  Patient ID and intended procedure confirmed with present staff. Received instructions for my participation in the procedure from the performing physician.  

## 2021-12-20 NOTE — Progress Notes (Signed)
[facility] History and Physical   Primary Care Physician:  Colon Branch, MD   Reason for Procedure:   Colon cancer screening  Plan:    colonoscopy     HPI: Carl Beltran is a 46 y.o. male here for colonoscopy   Past Medical History:  Diagnosis Date   Adult ADHD 2017   [place], predominantly inattentive presentation   Allergies    cats, hay   Attention deficit hyperactivity disorder (ADHD) 04/24/2016   Depressive personality disorder 2017   [facility]   Erectile dysfunction    Sleep apnea    NO CPAP    Past Surgical History:  Procedure Laterality Date   COLONOSCOPY  11/29/2006   Dr.Analise Glotfelty   HERNIA REPAIR     umbilical, at age 74 y/o   KNEE CARTILAGE SURGERY Bilateral 09/20/2018   SHOULDER ARTHROSCOPY WITH BICEPS TENDON REPAIR Left 09/2021    Prior to Admission medications   Medication Sig Start Date End Date Taking? Authorizing Provider  COLLAGEN PO Take by mouth.   Yes [provider]  Multiple Vitamin (MULTIVITAMIN ADULT PO) Take by mouth.   Yes [provider]  Omega-3 Fatty Acids (FISH OIL PO) Take by mouth.   Yes [provider]  Probiotic Product (PROBIOTIC DAILY PO) Take by mouth.   Yes [provider]  sildenafil (REVATIO) 20 MG tablet TAKE 3-4 TABLETS BY MOUTH AT BEDTIME AS NEEDED 08/03/21   Colon Branch, MD    Current Outpatient Medications  Medication Sig Dispense Refill   COLLAGEN PO Take by mouth.     Multiple Vitamin (MULTIVITAMIN ADULT PO) Take by mouth.     Omega-3 Fatty Acids (FISH OIL PO) Take by mouth.     Probiotic Product (PROBIOTIC DAILY PO) Take by mouth.     sildenafil (REVATIO) 20 MG tablet TAKE 3-4 TABLETS BY MOUTH AT BEDTIME AS NEEDED 60 tablet 3   Current Facility-Administered Medications  Medication Dose Route Frequency Provider Last Rate Last Admin   0.9 %  sodium chloride infusion  500 mL Intravenous Once [name], MD        Allergies as of  12/20/2021   (No Known Allergies)    Family History  Problem Relation Age of Onset   Tongue cancer Father    Headache Brother    CAD Other        GF   Colon cancer Neg Hx    Prostate cancer Neg Hx    Diabetes Neg Hx    Sleep apnea Neg Hx    Colon polyps Neg Hx    Stomach cancer Neg Hx    Rectal cancer Neg Hx     Social History   Socioeconomic History   Marital status: Married    Spouse name: Not on file   Number of children: 2   Years of education: Not on file   Highest education level: Bachelor's degree (e.g., BA, AB, BS)  Occupational History   Occupation: IT    Employer: [employer]  Tobacco Use   Smoking status: Former    Years: 3.00    Types: Cigarettes, Cigars   Smokeless tobacco: Never   Tobacco comments:    smoked while in college   Vaping Use   Vaping Use: Never used  Substance and Sexual Activity   Alcohol use: Not Currently    Comment: one everyother week   Drug use: No   Sexual activity: Not on file  Other Topics  Concern   Not on file  Social History Narrative   Household-- pt , wife ,2009, 2006      Lives at home with wife & children   Right handed   Caffeine: 1 cup/day      Review of Systems:  All other review of systems negative except as mentioned in the HPI.  Physical Exam: Vital signs BP 108/78    Pulse 97    Temp (!) 97.1 F (36.2 C) (Temporal)    Ht 5\' 10"  (1.778 m)    Wt 225 lb (102.1 kg)    SpO2 96%    BMI 32.28 kg/m   General:   Alert,  Well-developed, well-nourished, pleasant and cooperative in NAD Lungs:  Clear throughout to auscultation.   Heart:  Regular rate and rhythm; no murmurs, clicks, rubs,  or gallops. Abdomen:  Soft, nontender and nondistended. Normal bowel sounds.   Neuro/Psych:  Alert and cooperative. Normal mood and affect. A and O x 3   @Marilouise Densmore  Simonne Maffucci, MD, [facility] [phone] (pager) 12/20/2021 3:43 PM@

## 2021-12-20 NOTE — Progress Notes (Signed)
100 oral airway for OSA

## 2021-12-20 NOTE — Op Note (Signed)
[facility] Patient Name: Carl Beltran Procedure Date: 12/20/2021 3:37 PM MRN: [id number] Endoscopist: [name] , MD Age: 46 Referring MD:  Date of Birth: 1976/10/19 Gender: Male Account #: [id number] Procedure:                Colonoscopy Indications:              Screening for colorectal malignant neoplasm Medicines:                Propofol per Anesthesia, Monitored Anesthesia Care Procedure:                Pre-Anesthesia Assessment:                           - Prior to the procedure, a History and Physical                            was performed, and patient medications and                            allergies were reviewed. The patient's tolerance of                            previous anesthesia was also reviewed. The risks                            and benefits of the procedure and the sedation                            options and risks were discussed with the patient.                            All questions were answered, and informed consent                            was obtained. Prior Anticoagulants: The patient has                            taken no previous anticoagulant or antiplatelet                            agents. ASA Grade Assessment: III - A patient with                            severe systemic disease. After reviewing the risks                            and benefits, the patient was deemed in                            satisfactory condition to undergo the procedure.                           After obtaining informed consent, the colonoscope  was passed under direct vision. Throughout the                            procedure, the patient's blood pressure, pulse, and                            oxygen saturations were monitored continuously. The                            Olympus CF-HQ190L (Serial# [id number]) Colonoscope was                            introduced through the anus and advanced to the the                             cecum, identified by appendiceal orifice and                            ileocecal valve. The colonoscopy was performed                            without difficulty. The patient tolerated the                            procedure well. The quality of the bowel                            preparation was good. The ileocecal valve,                            appendiceal orifice, and rectum were photographed. Scope In: 3:50:52 PM Scope Out: 4:03:48 PM Scope Withdrawal Time: 0 hours 11 minutes 36 seconds  Total Procedure Duration: 0 hours 12 minutes 56 seconds  Findings:                 The perianal and digital rectal examinations were                            normal.                           Five sessile polyps were found in the rectum and                            sigmoid colon. The polyps were diminutive in size.                            These polyps were removed with a cold snare.                            Resection and retrieval were complete. Verification                            of patient identification for the specimen was  done. Estimated blood loss was minimal.                           Internal hemorrhoids were found.                           The exam was otherwise without abnormality on                            direct and retroflexion views. Complications:            No immediate complications. Estimated Blood Loss:     Estimated blood loss was minimal. Impression:               - Five diminutive polyps in the rectum and in the                            sigmoid colon, removed with a cold snare. Resected                            and retrieved.                           - Internal hemorrhoids.                           - The examination was otherwise normal on direct                            and retroflexion views. Recommendation:           - Patient has a contact number available for                            emergencies. The  signs and symptoms of potential                            delayed complications were discussed with the                            patient. Return to normal activities tomorrow.                            Written discharge instructions were provided to the                            patient.                           - Resume previous diet.                           - Continue present medications.                           - Await pathology results.                           -  Repeat colonoscopy is recommended for                            surveillance. The colonoscopy date will be                            determined after pathology results from today's                            exam become available for review. [name], MD 12/20/2021 4:09:02 PM This report has been signed electronically.

## 2021-12-20 NOTE — Progress Notes (Signed)
Pt non-responsive, VVS, Report to RN  °

## 2021-12-22 ENCOUNTER — Telehealth: Payer: Self-pay

## 2021-12-22 NOTE — Telephone Encounter (Signed)
°  Follow up Call-  Call back number 12/20/2021  Post procedure Call Back phone  # [phone]  Permission to leave phone message Yes  Some recent data might be hidden     Patient questions:  Do you have a fever, pain , or abdominal swelling? No. Pain Score  0 *  Have you tolerated food without any problems? Yes.    Have you been able to return to your normal activities? Yes.    Do you have any questions about your discharge instructions: Diet   No. Medications  No. Follow up visit  No.  Do you have questions or concerns about your Care? No.  Actions: * If pain score is 4 or above: No action needed, pain <4.

## 2021-12-25 ENCOUNTER — Encounter: Payer: Self-pay | Admitting: Internal Medicine

## 2021-12-25 ENCOUNTER — Other Ambulatory Visit: Payer: Self-pay | Admitting: Internal Medicine

## 2022-01-23 ENCOUNTER — Encounter: Payer: Managed Care, Other (non HMO) | Admitting: Internal Medicine

## 2022-02-06 ENCOUNTER — Ambulatory Visit: Payer: Managed Care, Other (non HMO) | Admitting: Internal Medicine

## 2022-02-06 ENCOUNTER — Encounter: Payer: Self-pay | Admitting: Internal Medicine

## 2022-02-06 VITALS — BP 126/82 | HR 100 | Temp 98.0°F | Resp 16 | Ht 70.0 in | Wt 233.0 lb

## 2022-02-06 DIAGNOSIS — G4733 Obstructive sleep apnea (adult) (pediatric): Secondary | ICD-10-CM | POA: Diagnosis not present

## 2022-02-06 DIAGNOSIS — Z9989 Dependence on other enabling machines and devices: Secondary | ICD-10-CM | POA: Diagnosis not present

## 2022-02-06 DIAGNOSIS — R000 Tachycardia, unspecified: Secondary | ICD-10-CM | POA: Diagnosis not present

## 2022-02-06 DIAGNOSIS — F909 Attention-deficit hyperactivity disorder, unspecified type: Secondary | ICD-10-CM

## 2022-02-06 NOTE — Assessment & Plan Note (Signed)
OSA: Dx 08-2021, unable to use the CPAP more than 4 hours at night, mostly due to dry mouth, we talk about adjusting the settings, also she could reach out to [facility] - the CPAP provider-  for further advice. ?ADHD: Currently untreated, sxs are still there, reportedly he saw psychology, recommend to reach out to them for further advice. ?Postnasal dripping: Recommend nasal sprays, declined. ?Tachycardia: Still slightly tachycardic, currently not taking any ADHD medication. ?Preventive care: Recommend a COVID booster, patient is skeptical. ?RTC CPX 08-2022 ?

## 2022-02-06 NOTE — Patient Instructions (Signed)
? ?  GO TO THE FRONT DESK, PLEASE SCHEDULE YOUR APPOINTMENTS ?Come back for  a physical exam by 07-2022 ?

## 2022-02-06 NOTE — Progress Notes (Signed)
? ?  Subjective:  ? ? Patient ID: Carl Beltran, male    DOB: 13-Jan-1976, 46 y.o.   MRN: [id number] ? ?DOS:  02/06/2022 ?Type of visit - description: f/u ? ?Since the last office visit was Dx with sleep apnea. ?Not using the CPAP 100%. ?Anxiety depression: Not an issue ?ADD symptoms: still has sxs ?Had a colonoscopy. ?Had COVID twice, since the second time he is having some postnasal dripping and clearing the throat but no other resp sxs ? ?Review of Systems ?See above  ? ?Past Medical History:  ?Diagnosis Date  ? Adult ADHD 2017  ? [place], predominantly inattentive presentation  ? Allergies   ? cats, hay  ? Attention deficit hyperactivity disorder (ADHD) 04/24/2016  ? Depressive personality disorder 2017  ? [place]  ? Erectile dysfunction   ? Sleep apnea   ? NO CPAP  ? ? ?Past Surgical History:  ?Procedure Laterality Date  ? COLONOSCOPY  11/29/2006  ? Dr.Gessner  ? HERNIA REPAIR    ? umbilical, at age 30 y/o  ? KNEE CARTILAGE SURGERY Bilateral 09/20/2018  ? SHOULDER ARTHROSCOPY WITH BICEPS TENDON REPAIR Left 09/2021  ? ? ?Current Outpatient Medications  ?Medication Instructions  ? COLLAGEN PO Oral  ? Multiple Vitamin (MULTIVITAMIN ADULT PO) Oral  ? Omega-3 Fatty Acids (FISH OIL PO) Oral  ? Probiotic Product (PROBIOTIC DAILY PO) Oral  ? sildenafil (REVATIO) 20 MG tablet TAKE 3 TO 4 TABLETS BY MOUTH AT BEDTIME AS NEEDED  ? ? ?   ?Objective:  ? Physical Exam ?BP 126/82 (BP Location: Left Arm, Patient Position: Sitting, Cuff Size: Normal)   Pulse 100   Temp 98 ?F (36.7 ?C) (Oral)   Resp 16   Ht '5\' 10"'$  (1.778 m)   Wt 233 lb (105.7 kg)   SpO2 99%   BMI 33.43 kg/m?  ?General:   ?Well developed, NAD, BMI noted. ?HEENT:  ?Normocephalic . Face symmetric, atraumatic ?Lungs:  ?CTA B ?Normal respiratory effort, no intercostal retractions, no accessory muscle use. ?Heart: RRR,  no murmur.  ?Lower extremities: no pretibial edema bilaterally  ?Skin: Not pale. Not jaundice ?Neurologic:  ?alert &  oriented X3.  ?Speech normal, gait appropriate for age and unassisted ?Psych--  ?Cognition and judgment appear intact.  ?Cooperative with normal attention span and concentration.  ?Behavior appropriate. ?No anxious or depressed appearing.  ? ?   ?Assessment   ? ?ASSESSMENT ?ADHD (confirmed by psychology 03-2016) ?Depression (dx by psych 03-2016) ?ED ?Tachycardia >> saw cards, Myoview 02-2021, echo 03/2021 negative ?OSA , severe dx  08-2021 ?Covid infex 09-2019, 07-2021 ? ? ?PLAN: ?OSA: Dx 08-2021, unable to use the CPAP more than 4 hours at night, mostly due to dry mouth, we talk about adjusting the settings, also she could reach out to [facility] - the CPAP provider-  for further advice. ?ADHD: Currently untreated, sxs are still there, reportedly he saw psychology, recommend to reach out to them for further advice. ?Postnasal dripping: Recommend nasal sprays, declined. ?Tachycardia: Still slightly tachycardic, currently not taking any ADHD medication. ?Preventive care: Recommend a COVID booster, patient is skeptical. ?RTC CPX 08-2022 ? ?  ? ?This visit occurred during the SARS-CoV-2 public health emergency.  Safety protocols were in place, including screening questions prior to the visit, additional usage of staff PPE, and extensive cleaning of exam room while observing appropriate contact time as indicated for disinfecting solutions.  ? ?

## 2022-03-12 ENCOUNTER — Encounter: Payer: Self-pay | Admitting: Internal Medicine

## 2022-03-13 NOTE — Telephone Encounter (Signed)
Pt was scheduled for an office visit to see Dr. Carlean Purl on 03/24/2022 at 10:10: Pt was made aware: ?Pt verbalized understanding with all questions answered.  ? ?

## 2022-03-14 ENCOUNTER — Ambulatory Visit: Payer: Managed Care, Other (non HMO) | Admitting: Family Medicine

## 2022-03-14 NOTE — Progress Notes (Deleted)
  PATIENT: Carl Beltran DOB: 05/03/1976  REASON FOR VISIT: follow up HISTORY FROM: patient  No chief complaint on file.    HISTORY OF PRESENT ILLNESS:  03/14/22 ALL:  Carl Beltran is a 45 y.o. male here today for follow up for OSA on CPAP. He was last seen by Carl Beltran 12/2021 and having difficulty tolerating CPAP. Pressure settings were adjusted to 4-12cmH20.    HISTORY: (copied from Carl Beltran's previous note)  Carl Beltran is a 45-year-old right-handed gentleman with an underlying medical history of ADHD, tachycardia, allergies, depression, ED, recurrent headaches (followed by Carl Beltran and Carl Lomax, NP in this office), and mild obesity, who presents for follow-up consultation of his obstructive sleep apnea after interim testing and starting AutoPap therapy.  The patient is unaccompanied today.  I first met him at the request of his primary care physician on 08/17/2021, at which time he reported snoring and excessive daytime somnolence.  I advised him to proceed with sleep testing.  He had a home sleep test on 08/31/2021 which indicated severe obstructive sleep apnea with an AHI of 78.4/h, O2 nadir 77%.  Fairly loud snoring was detected throughout the night.  He was advised to start AutoPap therapy.  His set up date was 09/21/2021.  He has a Luna machine.   Today, 12/14/2021: I reviewed his AutoPap compliance data from 11/14/2021 to 12/13/2021, which is a total of 30 days, during which time he used his machine only 4 days with percent use days greater than 4 hours at 0%, indicating noncompliance.  His compliance between 09/21/2021 through current showed 29 out of 84 days of usage, percent use days greater than 4 hours at 7.1% only.  He did try to use his machine more consistently in the first 12 days.  He reports that the pressure is too high.  He has not been in touch with his DME company regarding a pressure change or called us or emailed us, unfortunately.  He is encouraged to get in touch  with his DME provider.  I provided the phone number.  He is also encouraged to always get in touch with us if he has trouble with his machine.  He has not noticed any benefit yet but he admits that he has not been using it much.  He is willing to try it to use it at a lower pressure.  He reports that his home sleep test barely slept.  He reports that he had insomnia that night.  He is advised that his desaturations did go into the 70s.  If he has desaturations during wakefulness, he may need to be evaluated by a pulmonologist for an underlying cause, he is encouraged to talk to his primary care about evaluation if need be.  He is working on weight loss.  He had shoulder surgery in November 2022 and is now getting back on exercising more.    REVIEW OF SYSTEMS: Out of a complete 14 system review of symptoms, the patient complains only of the following symptoms, and all other reviewed systems are negative.  ESS:  ALLERGIES: No Known Allergies  HOME MEDICATIONS: Outpatient Medications Prior to Visit  Medication Sig Dispense Refill   COLLAGEN PO Take by mouth.     Multiple Vitamin (MULTIVITAMIN ADULT PO) Take by mouth.     Omega-3 Fatty Acids (FISH OIL PO) Take by mouth.     Probiotic Product (PROBIOTIC DAILY PO) Take by mouth.     sildenafil (REVATIO) 20 MG   tablet TAKE 3 TO 4 TABLETS BY MOUTH AT BEDTIME AS NEEDED 60 tablet 3   No facility-administered medications prior to visit.    PAST MEDICAL HISTORY: Past Medical History:  Diagnosis Date   Adult ADHD 2017   Carl Beltran, predominantly inattentive presentation   Allergies    cats, hay   Attention deficit hyperactivity disorder (ADHD) 04/24/2016   Depressive personality disorder 2017   [facility]   Erectile dysfunction    Sleep apnea    NO CPAP    PAST SURGICAL HISTORY: Past Surgical History:  Procedure Laterality Date   COLONOSCOPY  11/29/2006   CarlGessner   HERNIA REPAIR     umbilical, at age 85 y/o    KNEE CARTILAGE SURGERY Bilateral 09/20/2018   SHOULDER ARTHROSCOPY WITH BICEPS TENDON REPAIR Left 09/2021    FAMILY HISTORY: Family History  Problem Relation Age of Onset   Tongue cancer Father    Headache Brother    CAD Other        GF   Colon cancer Neg Hx    Prostate cancer Neg Hx    Diabetes Neg Hx    Sleep apnea Neg Hx    Colon polyps Neg Hx    Stomach cancer Neg Hx    Rectal cancer Neg Hx     SOCIAL HISTORY: Social History   Socioeconomic History   Marital status: Married    Spouse name: Not on file   Number of children: 2   Years of education: Not on file   Highest education level: Bachelor's degree (e.g., BA, AB, BS)  Occupational History   Occupation: IT    Employer: [employer]  Tobacco Use   Smoking status: Former    Years: 3.00    Types: Cigarettes, Cigars   Smokeless tobacco: Never   Tobacco comments:    smoked while in college   Vaping Use   Vaping Use: Never used  Substance and Sexual Activity   Alcohol use: Not Currently    Comment: one everyother week   Drug use: No   Sexual activity: Not on file  Other Topics Concern   Not on file  Social History Narrative   Household-- pt , wife ,2009, 2006      Lives at home with wife & children   Right handed   Caffeine: 1 cup/day   Social Determinants of Health   Financial Resource Strain: Not on file  Food Insecurity: Not on file  Transportation Needs: Not on file  Physical Activity: Not on file  Stress: Not on file  Social Connections: Not on file  Intimate Partner Violence: Not on file     PHYSICAL EXAM  There were no vitals filed for this visit. There is no height or weight on file to calculate BMI.  Generalized: Well developed, in no acute distress  Cardiology: normal rate and rhythm, no murmur noted Respiratory: clear to auscultation bilaterally  Neurological examination  Mentation: Alert oriented to time, place, history taking. Follows all commands speech and language  fluent Cranial nerve II-XII: Pupils were equal round reactive to light. Extraocular movements were full, visual field were full  Motor: The motor testing reveals 5 over 5 strength of all 4 extremities. Good symmetric motor tone is noted throughout.  Gait and station: Gait is normal.    DIAGNOSTIC DATA (LABS, IMAGING, TESTING) - I reviewed patient records, labs, notes, testing and imaging myself where available.      View : No data to display.  Lab Results  Component Value Date   WBC 10.6 (H) 08/14/2021   HGB 15.8 08/14/2021   HCT 46.6 08/14/2021   MCV 88.1 08/14/2021   PLT 379 08/14/2021      Component Value Date/Time   NA 137 08/14/2021 1428   K 3.6 08/14/2021 1428   CL 102 08/14/2021 1428   CO2 26 08/14/2021 1428   GLUCOSE 116 (H) 08/14/2021 1428   BUN 10 08/14/2021 1428   CREATININE 1.06 08/14/2021 1428   CALCIUM 9.1 08/14/2021 1428   PROT 7.5 12/15/2020 0954   ALBUMIN 4.8 12/15/2020 0954   AST 19 12/15/2020 0954   ALT 23 12/15/2020 0954   ALKPHOS 70 12/15/2020 0954   BILITOT 0.8 12/15/2020 0954   GFRNONAA >60 08/14/2021 1428   GFRAA >90 01/15/2012 0605   Lab Results  Component Value Date   CHOL 163 07/15/2020   HDL 41 07/15/2020   LDLCALC 94 07/15/2020   LDLDIRECT 58.0 06/04/2018   TRIG 180 (H) 07/15/2020   CHOLHDL 4.0 07/15/2020   Lab Results  Component Value Date   HGBA1C 5.4 06/04/2018   No results found for: VITAMINB12 Lab Results  Component Value Date   TSH 1.52 12/15/2020     ASSESSMENT AND PLAN 46 y.o. year old male  has a past medical history of Adult ADHD (2017), Allergies, Attention deficit hyperactivity disorder (ADHD) (04/24/2016), Depressive personality disorder (2017), Erectile dysfunction, and Sleep apnea. here with   No diagnosis found.    KELL FERRIS is doing well on CPAP therapy. Compliance report reveals ***. *** was encouraged to continue using CPAP nightly and for greater than 4 hours each night. We will update  supply orders as indicated. Risks of untreated sleep apnea review and education materials provided. Healthy lifestyle habits encouraged. *** will follow up in ***, sooner if needed. *** verbalizes understanding and agreement with this plan.    No orders of the defined types were placed in this encounter.    No orders of the defined types were placed in this encounter.     Debbora Presto, FNP-C 03/14/2022, 7:20 AM [facility] [address], [place], [place] [address] [phone]

## 2022-03-14 NOTE — Patient Instructions (Incomplete)

## 2022-03-24 ENCOUNTER — Encounter: Payer: Self-pay | Admitting: Internal Medicine

## 2022-03-24 ENCOUNTER — Other Ambulatory Visit (INDEPENDENT_AMBULATORY_CARE_PROVIDER_SITE_OTHER): Payer: Managed Care, Other (non HMO)

## 2022-03-24 ENCOUNTER — Ambulatory Visit: Payer: Managed Care, Other (non HMO) | Admitting: Internal Medicine

## 2022-03-24 VITALS — BP 120/70 | HR 103 | Ht 69.25 in | Wt 236.1 lb

## 2022-03-24 DIAGNOSIS — R140 Abdominal distension (gaseous): Secondary | ICD-10-CM

## 2022-03-24 DIAGNOSIS — K529 Noninfective gastroenteritis and colitis, unspecified: Secondary | ICD-10-CM | POA: Diagnosis not present

## 2022-03-24 LAB — C-REACTIVE PROTEIN: CRP: <1 mg/dL (ref 0.5–20.0)

## 2022-03-24 LAB — SEDIMENTATION RATE: Sed Rate: 5 mm/hr (ref 0–15)

## 2022-03-24 NOTE — Progress Notes (Signed)
Carl Beltran 46 y.o. Mar 21, 1976 [id number]  Assessment & Plan:   Encounter Diagnoses  Name Primary?   Abdominal distention Yes   Bloating    Frequent defecation      Cause of these episodes is not clear.  I wonder about abdomino phrenic dyssynergia.  Chronic frequent stools question IBS question celiac disease.  We will test for that.  Also check CRP and sed rate.  Colonoscopy did not reveal any endoscopic evidence of colitis and his history is not compatible with microscopic colitis.  He did have ileitis back in 2007 on a CT scan only a follow-up colonoscopy was negative including ileal inspection and biopsies.  Crohn's disease is in the differential I would say though I still think unlikely.   I have asked him to take a photograph and also come in for an abdominal x-ray the next time he has an episode of abdominal distention.  I have told him to look at abdomino phrenic dyssynergia on the web.  What he describes certainly sounds like this paradoxical diaphragm descent and an appropriate abdominal wall relaxation.  One thing I did not consider but we will follow-up on is whether or not he is taking any supplements or medications for lifting which can contribute to abdominal distention issues seen in body builders.   Orders Placed This Encounter  Procedures   DG Abd 2 Views   Tissue transglutaminase, IgA   IgA   C-reactive protein   Sedimentation rate   CC: Colon Branch, MD   Subjective:   Chief Complaint: Bloating  HPI 89 year old white man that I have met in the past initially in 2008 after late 2007 CT scan in the setting of a diarrheal illness suggested ileitis, colonoscopy then demonstrated a normal ileum including biopsies and hemorrhoids.  I then saw him February 2023 for a screening colonoscopy and he had 5 distal sigmoid and rectal hyperplastic polyps and hemorrhoids.  Repeat colonoscopy 2033 planned.  He is here because of a lifelong history of frequent bowel  movements, 70% formed 30% loose, 10 or more times a day including overnight.  He has had problems like this his whole life he thinks.  He also has several episodes a year with extreme bloating and abdominal distention and says he looks pregnant.  He had 1 within the past month or so and is just getting over it and that was unusual and that it lasted for weeks.  Normally it is 2 to 3 days.  He has never taken a photo or had imaging during 1 of these episodes.  He has never been tested for celiac disease.  Bowel movements are unchanged during the episodes he does not vomit.  He feels unwell.  He has been a Product manager since the seventh grade.  Power lifting.  He has had to lay off of that and his CrossFit over the past several months because he had a biceps tear and a repair on the left.  He thinks that is why his weight has gone up recently. No Known Allergies Current Meds  Medication Sig   CINNAMON PO Take 2 capsules by mouth daily.   sildenafil (REVATIO) 20 MG tablet TAKE 3 TO 4 TABLETS BY MOUTH AT BEDTIME AS NEEDED   TURMERIC PO Take 1 capsule by mouth daily.   Past Medical History:  Diagnosis Date   Adult ADHD 2017   [place], predominantly inattentive presentation   Allergies    cats, hay   Attention  deficit hyperactivity disorder (ADHD) 04/24/2016   Depressive personality disorder 2017   [facility]   Erectile dysfunction    History of colon polyps    distal sigmoid and rectal hyperplastic polyps   Sleep apnea    NO CPAP   Past Surgical History:  Procedure Laterality Date   COLONOSCOPY  11/29/2006   Dr.Quantez Schnyder   KNEE CARTILAGE SURGERY Bilateral 09/20/2018   SHOULDER ARTHROSCOPY WITH BICEPS TENDON REPAIR Left 15/8682   UMBILICAL HERNIA REPAIR     umbilical, at age 62 y/o   Social History   Social History Narrative   Household-- pt , wife ,2009, 2006      Lives at home with wife & children   Right handed   Caffeine: 1 cup/day   family history  includes CAD in an other family member; Headache in his brother; Tongue cancer in his father.   Review of Systems See HPI  Objective:   Physical Exam BP 120/70   Pulse (!) 103   Ht 5' 9.25" (1.759 m) Comment: height measured without shoes  Wt 236 lb 2 oz (107.1 kg)   BMI 34.62 kg/m  WDWN NAd Lungs cta Cor NL  Abd soft NT no mass

## 2022-03-24 NOTE — Patient Instructions (Addendum)
You may have a condition called abdominophrenic dyssynergia. You can look that up. The [employer] has some videos about it. You can [place] and you will see them.  Your provider has requested that you go to the basement level for lab work before leaving today. Press "B" on the elevator. The lab is located at the first door on the left as you exit the elevator.  Due to recent changes in healthcare laws, you may see the results of your imaging and laboratory studies on MyChart before your provider has had a chance to review them.  We understand that in some cases there may be results that are confusing or concerning to you. Not all laboratory results come back in the same time frame and the provider may be waiting for multiple results in order to interpret others.  Please give [place] 48 hours in order for your provider to thoroughly review all the results before contacting the office for clarification of your results.   When you have a spell of the abdominal distention come to our x-ray department, open Mon-Fri 8-5pm, close for lunch 12:30pm-1:00pm. No appointment needed. Also when you have a spell of the distention take a picture.   I appreciate the opportunity to care for you. Silvano Rusk, MD, [facility]

## 2022-03-27 LAB — IGA: Immunoglobulin A: 360 mg/dL — ABNORMAL HIGH (ref 47–310)

## 2022-03-27 LAB — TISSUE TRANSGLUTAMINASE, IGA: (tTG) Ab, IgA: <1 U/mL

## 2022-04-05 ENCOUNTER — Ambulatory Visit (INDEPENDENT_AMBULATORY_CARE_PROVIDER_SITE_OTHER)
Admission: RE | Admit: 2022-04-05 | Discharge: 2022-04-05 | Disposition: A | Discharge status: Home / Self Care | Payer: Managed Care, Other (non HMO) | Source: Ambulatory Visit | Attending: Internal Medicine | Admitting: Internal Medicine

## 2022-04-05 DIAGNOSIS — R140 Abdominal distension (gaseous): Secondary | ICD-10-CM

## 2022-04-06 ENCOUNTER — Encounter: Payer: Self-pay | Admitting: Internal Medicine

## 2022-04-07 ENCOUNTER — Other Ambulatory Visit: Payer: Self-pay | Admitting: Internal Medicine

## 2022-04-07 DIAGNOSIS — R197 Diarrhea, unspecified: Secondary | ICD-10-CM

## 2022-04-07 NOTE — Progress Notes (Signed)
alprotectin

## 2022-04-10 ENCOUNTER — Other Ambulatory Visit: Payer: Managed Care, Other (non HMO)

## 2022-04-14 ENCOUNTER — Other Ambulatory Visit: Payer: Managed Care, Other (non HMO)

## 2022-04-14 DIAGNOSIS — R197 Diarrhea, unspecified: Secondary | ICD-10-CM

## 2022-04-19 ENCOUNTER — Encounter: Payer: Self-pay | Admitting: Internal Medicine

## 2022-04-24 LAB — CALPROTECTIN, FECAL: Calprotectin, Fecal: 84 ug/g (ref 0–120)

## 2022-04-25 ENCOUNTER — Other Ambulatory Visit: Payer: Self-pay | Admitting: Internal Medicine

## 2022-04-25 DIAGNOSIS — R197 Diarrhea, unspecified: Secondary | ICD-10-CM

## 2022-05-12 ENCOUNTER — Encounter: Payer: Self-pay | Admitting: Internal Medicine

## 2022-05-25 ENCOUNTER — Encounter: Payer: Self-pay | Admitting: Internal Medicine

## 2022-05-26 ENCOUNTER — Telehealth: Payer: Self-pay

## 2022-05-26 NOTE — Telephone Encounter (Signed)
Nurse Assessment Nurse: D'Heur Lucia Gaskins, RN, Adrienne Date/Time (Eastern Time): 05/26/2022 8:38:35 AM Confirm and document reason for call. If symptomatic, describe symptoms. ---Caller states he pulled muscle in his chest on the right side carrying something and making sure he's not having a heart attack. The pain is sharp when he moves a certain way and radiates into the back on the right upper back. The pain is present only with certain movements. Does the patient have any new or worsening symptoms? ---Yes Will a triage be completed? ---Yes Related visit to physician within the last 2 weeks? ---No Does the PT have any chronic conditions? (i.e. diabetes, asthma, this includes High risk factors for pregnancy, etc.) ---No Is this a behavioral health or substance abuse call? ---No Guidelines Guideline Title Affirmed Question Affirmed Notes Nurse Date/Time Eilene Ghazi Time) Chest Injury [1] Chest wall swelling, bruise or pain AND [2] present < 7 days D'Heur Lucia Gaskins, RN, Adrienne 05/26/2022 8:40:46 AM Disp. Time Eilene Ghazi Time) Disposition Final User 05/26/2022 8:37:04 AM Send to Urgent Daron Offer, [place] PLEASE NOTE: All timestamps contained within this report are represented as [place] Standard Time. CONFIDENTIALTY NOTICE: This fax transmission is intended only for the addressee. It contains information that is legally privileged, confidential or otherwise protected from use or disclosure. If you are not the intended recipient, you are strictly prohibited from reviewing, disclosing, copying using or disseminating any of this information or taking any action in reliance on or regarding this information. If you have received this fax in error, please notify us immediately by telephone so that we can arrange for its return to [place]. Phone: [phone], Toll-Free: [phone], Fax: [phone] Page: 2 of 2 Call Id: [id number] [place]. Time Eilene Ghazi Time) Disposition Final User 05/26/2022  8:46:38 AM Home Care Yes [place], RN, [place] Final Disposition 05/26/2022 8:46:38 AM Home Care Yes D'Heur Lucia Gaskins, RN, Carl Beltran Caller Disagree/Comply Comply Caller Understands Yes PreDisposition Call Doctor Care Advice Given Per Guideline HOME CARE: * You should be able to treat this at home. USE A COLD PACK FOR PAIN, SWELLING, OR BRUISING: * Put a cold pack or an ice bag (wrapped in a moist towel) on the area for 20 minutes. USE HEAT ON AREA AFTER 48 HOURS: * If pain, swelling, or bruising last more than 48 hours (2 days), then use heat on the area. * Use a heat pack, heating pad, or warm wet washcloth. * Do this for 10 minutes three times a day. PAIN MEDICINES: * For pain relief, you can take either acetaminophen, ibuprofen, or naproxen. CALL BACK IF: * Swelling or bruise becomes over 2 inches (5 cm) * Pain or swelling lasts over 7 days * You become worse CARE ADVICE given per Chest Injury (Adult) guideline. Comments User: Carl Beltran, D'Heur Lucia Gaskins, RN Date/Time Eilene Ghazi Time): 05/26/2022 8:44:02 AM Caller states it feels tighter when he presses on the area where he pulled a muscle

## 2022-05-26 NOTE — Telephone Encounter (Signed)
Recommended home care, see below. Pt has an appt scheduled on Tuesday w/ PCP as well.

## 2022-05-29 ENCOUNTER — Ambulatory Visit: Payer: Managed Care, Other (non HMO) | Admitting: Internal Medicine

## 2022-05-30 ENCOUNTER — Telehealth: Payer: Self-pay

## 2022-05-30 ENCOUNTER — Ambulatory Visit: Payer: Managed Care, Other (non HMO) | Admitting: Internal Medicine

## 2022-05-30 ENCOUNTER — Encounter: Payer: Self-pay | Admitting: Internal Medicine

## 2022-05-30 VITALS — BP 118/62 | HR 101 | Temp 98.4°F | Resp 16 | Ht 69.25 in | Wt 241.1 lb

## 2022-05-30 DIAGNOSIS — R052 Subacute cough: Secondary | ICD-10-CM

## 2022-05-30 DIAGNOSIS — E669 Obesity, unspecified: Secondary | ICD-10-CM

## 2022-05-30 DIAGNOSIS — G4733 Obstructive sleep apnea (adult) (pediatric): Secondary | ICD-10-CM

## 2022-05-30 MED ORDER — WEGOVY 0.25 MG/0.5ML ~~LOC~~ SOAJ: 0.2500 mg | SUBCUTANEOUS | 0 refills | Status: DC

## 2022-05-30 MED ORDER — PANTOPRAZOLE SODIUM 40 MG PO TBEC: 40.0000 mg | DELAYED_RELEASE_TABLET | Freq: Every day | ORAL | 3 refills | Status: DC

## 2022-05-30 NOTE — Telephone Encounter (Signed)
PA initiated via Covermymeds; KEY: [place]. Awaiting determination.

## 2022-05-30 NOTE — Progress Notes (Unsigned)
Subjective:    Patient ID: Carl Beltran, male    DOB: 1976/01/16, 46 y.o.   MRN: [id number]  DOS:  05/30/2022 Type of visit - description: Multiple issues  Patient is concerned about weight gain. Is his observation that it has been worse in the last few months, on chart review this has been a gradual process for the last few years.  His wife is concerned about diabetes.  He denies blurred vision, polyuria, questional polydipsia.  He also reports lower extremity edema, exam is negative.  Sleep apnea: Intolerant to CPAP, see assessment and plan.  Continue with cough, it is daily, persisting, mostly dry. No classic heartburn but he feels "something in the throat".  No allergies to speak of. Reported no chest pain to me.  Wt Readings from Last 3 Encounters:  05/30/22 241 lb 2 oz (109.4 kg)  03/24/22 236 lb 2 oz (107.1 kg)  02/06/22 233 lb (105.7 kg)     Weight 2019: 211 pounds. Weight 2021: 224 pounds. Weight today: 241 pounds   Review of Systems See above   Past Medical History:  Diagnosis Date   Adult ADHD 2017   [place], predominantly inattentive presentation   Allergies    cats, hay   Attention deficit hyperactivity disorder (ADHD) 04/24/2016   Depressive personality disorder 2017   [facility]   Erectile dysfunction    History of colon polyps    distal sigmoid and rectal hyperplastic polyps   Sleep apnea    NO CPAP    Past Surgical History:  Procedure Laterality Date   COLONOSCOPY  11/29/2006   Dr.Gessner   KNEE CARTILAGE SURGERY Bilateral 09/20/2018   SHOULDER ARTHROSCOPY WITH BICEPS TENDON REPAIR Left 23/5361   UMBILICAL HERNIA REPAIR     umbilical, at age 42 y/o    Current Outpatient Medications  Medication Instructions   CINNAMON PO 2 capsules, Oral, Daily   sildenafil (REVATIO) 20 MG tablet TAKE 3 TO 4 TABLETS BY MOUTH AT BEDTIME AS NEEDED   TURMERIC PO 1 capsule, Oral, Daily       Objective:   Physical  Exam BP 118/62   Pulse (!) 101   Temp 98.4 F (36.9 C) (Oral)   Resp 16   Ht 5' 9.25" (1.759 m)   Wt 241 lb 2 oz (109.4 kg)   SpO2 94%   BMI 35.35 kg/m  General:   Well developed, NAD, BMI noted. HEENT:  Normocephalic . Face symmetric, atraumatic Lungs:  CTA B Normal respiratory effort, no intercostal retractions, no accessory muscle use. Heart: RRR,  no murmur.  Lower extremities: no pretibial edema bilaterally  Skin: Not pale. Not jaundice Neurologic:  alert & oriented X3.  Speech normal, gait appropriate for age and unassisted Psych--  Cognition and judgment appear intact.  Cooperative with normal attention span and concentration.  Behavior appropriate. No anxious or depressed appearing.      Assessment     ASSESSMENT ADHD (confirmed by psychology 03-2016) Depression (dx by psych 03-2016) ED Tachycardia >> saw cards, Myoview 02-2021, echo 03/2021 negative OSA , severe dx  08-2021 Covid infex 09-2019, 07-2021   PLAN: Morbid obesity: BMI of 35, OSA,  weight increasing gradually. Request pharmacological therapy, it is indicated, of the options available, GLP-1 receptor agonist are appropriate. We agreed on Wegovy,  how to use it discussed with the patient.  If unable to get it he could go for Saxenda. He is aware these medications will work as long as  he continue taking them thus is extremely important he develops good diet habits. Also encouraged regular exercise. Cough: Persistent, reportedly since he had COVID 07/2021.  Last CXR okay.  Recommend a trial with PPIs.  If not better refer to pulmonary. OSA: Since the last visit he reportedly tried harder to use the CPAP however he was never able to get used to it despite using different masks etc., unfortunately he returned the equipment to [place] care.  Advised that we will have to re-address OSA at some point since it is a major CV RF  RTC 2- 3 m  Time spent 30 minutes, discussing diet, exercise, pros and cons of GLP-1  receptor agonists.  We also talk about OSA and cardiovascular risk

## 2022-05-30 NOTE — Patient Instructions (Addendum)
Will start Wegovy. Start to eat healthy.  You may follow a program such as [employer] or weight watchers. Start gradual exercise program.  Your goal is to be active 3 hours of every week.  Start taking pantoprazole 40 mg 1 tablet before your breakfast. This is a acid suppressant but should help with the cough. If your cough is not gradually improving let me know.   Recommend to proceed with covid booster (bivalent) at your pharmacy. Flu shot this fall.      GO TO THE LAB : Get the blood work     GO TO THE FRONT DESK, [place] Come back for checkup in 2 to 3 months

## 2022-05-31 ENCOUNTER — Encounter: Payer: Self-pay | Admitting: Internal Medicine

## 2022-05-31 DIAGNOSIS — E669 Obesity, unspecified: Secondary | ICD-10-CM | POA: Insufficient documentation

## 2022-05-31 LAB — COMPREHENSIVE METABOLIC PANEL
ALT: 26 U/L (ref 0–53)
AST: 19 U/L (ref 0–37)
Albumin: 4.5 g/dL (ref 3.5–5.2)
Alkaline Phosphatase: 75 U/L (ref 39–117)
BUN: 16 mg/dL (ref 6–23)
CO2: 28 mEq/L (ref 19–32)
Calcium: 9.3 mg/dL (ref 8.4–10.5)
Chloride: 103 mEq/L (ref 96–112)
Creatinine, Ser: 1.12 mg/dL (ref 0.40–1.50)
GFR: 79.05 mL/min (ref >60.00)
Glucose, Bld: 110 mg/dL — ABNORMAL HIGH (ref 70–99)
Potassium: 4.2 mEq/L (ref 3.5–5.1)
Sodium: 139 mEq/L (ref 135–145)
Total Bilirubin: 0.5 mg/dL (ref 0.2–1.2)
Total Protein: 7.2 g/dL (ref 6.0–8.3)

## 2022-05-31 LAB — HEMOGLOBIN A1C: Hgb A1c MFr Bld: 5.5 % (ref 4.6–6.5)

## 2022-05-31 LAB — TSH: TSH: 1.28 u[IU]/mL (ref 0.35–5.50)

## 2022-05-31 NOTE — Assessment & Plan Note (Signed)
Morbid obesity: BMI of 35, OSA,  weight increasing gradually. Request pharmacological therapy, it is indicated, of the options available, GLP-1 receptor agonist are appropriate. We agreed on Wegovy,  how to use it discussed with the patient.  If unable to get it he could go for Saxenda. He is aware these medications will work as long as he continue taking them thus is extremely important he develops good diet habits. Also encouraged regular exercise. Cough: Persistent, reportedly since he had COVID 07/2021.  Last CXR okay.  Recommend a trial with PPIs.  If not better refer to pulmonary. OSA: Since the last visit he reportedly tried harder to use the CPAP however he was never able to get used to it despite using different masks etc., unfortunately he returned the equipment to [place] care.  Advised that we will have to re-address OSA at some point since it is a major CV RF  RTC 2- 3 m

## 2022-05-31 NOTE — Telephone Encounter (Signed)
PA approved. Effective 05/30/22 to 12/26/22.

## 2022-06-09 ENCOUNTER — Other Ambulatory Visit: Payer: Self-pay | Admitting: Internal Medicine

## 2022-06-30 ENCOUNTER — Other Ambulatory Visit (HOSPITAL_BASED_OUTPATIENT_CLINIC_OR_DEPARTMENT_OTHER): Payer: Self-pay

## 2022-06-30 ENCOUNTER — Encounter: Payer: Self-pay | Admitting: Internal Medicine

## 2022-06-30 MED ORDER — WEGOVY 0.25 MG/0.5ML ~~LOC~~ SOAJ
0.2500 mg | SUBCUTANEOUS | 0 refills | Status: DC
  Filled 2022-06-30: qty 2, 28d supply, fill #0

## 2022-07-26 ENCOUNTER — Other Ambulatory Visit (HOSPITAL_BASED_OUTPATIENT_CLINIC_OR_DEPARTMENT_OTHER): Payer: Self-pay

## 2022-07-26 MED ORDER — WEGOVY 0.5 MG/0.5ML ~~LOC~~ SOAJ
0.5000 mg | SUBCUTANEOUS | 0 refills | Status: DC
  Filled 2022-07-26: qty 2, 28d supply, fill #0

## 2022-07-26 MED ORDER — PANTOPRAZOLE SODIUM 40 MG PO TBEC
40.0000 mg | DELAYED_RELEASE_TABLET | Freq: Every day | ORAL | 1 refills | Status: DC
  Filled 2022-07-26: qty 30, 30d supply, fill #0

## 2022-08-02 ENCOUNTER — Other Ambulatory Visit (HOSPITAL_BASED_OUTPATIENT_CLINIC_OR_DEPARTMENT_OTHER): Payer: Self-pay

## 2022-08-04 ENCOUNTER — Encounter: Payer: Self-pay | Admitting: Internal Medicine

## 2022-08-04 ENCOUNTER — Ambulatory Visit (INDEPENDENT_AMBULATORY_CARE_PROVIDER_SITE_OTHER): Payer: Managed Care, Other (non HMO) | Admitting: Internal Medicine

## 2022-08-04 VITALS — BP 118/68 | HR 86 | Temp 98.0°F | Resp 18 | Ht 69.25 in | Wt 239.2 lb

## 2022-08-04 DIAGNOSIS — R5383 Other fatigue: Secondary | ICD-10-CM

## 2022-08-04 DIAGNOSIS — R7989 Other specified abnormal findings of blood chemistry: Secondary | ICD-10-CM

## 2022-08-04 DIAGNOSIS — Z23 Encounter for immunization: Secondary | ICD-10-CM | POA: Diagnosis not present

## 2022-08-04 DIAGNOSIS — Z0000 Encounter for general adult medical examination without abnormal findings: Secondary | ICD-10-CM | POA: Diagnosis not present

## 2022-08-04 LAB — CBC WITH DIFFERENTIAL/PLATELET
Basophils Absolute: 0.1 10*3/uL (ref 0.0–0.1)
Basophils Relative: 1 % (ref 0.0–3.0)
Eosinophils Absolute: 0.1 10*3/uL (ref 0.0–0.7)
Eosinophils Relative: 1.6 % (ref 0.0–5.0)
HCT: 44.3 % (ref 39.0–52.0)
Hemoglobin: 15.1 g/dL (ref 13.0–17.0)
Lymphocytes Relative: 31.3 % (ref 12.0–46.0)
Lymphs Abs: 2.1 10*3/uL (ref 0.7–4.0)
MCHC: 33.9 g/dL (ref 30.0–36.0)
MCV: 87.2 fl (ref 78.0–100.0)
Monocytes Absolute: 0.6 10*3/uL (ref 0.1–1.0)
Monocytes Relative: 8.2 % (ref 3.0–12.0)
Neutro Abs: 3.9 10*3/uL (ref 1.4–7.7)
Neutrophils Relative %: 57.9 % (ref 43.0–77.0)
Platelets: 315 10*3/uL (ref 150.0–400.0)
RBC: 5.09 Mil/uL (ref 4.22–5.81)
RDW: 12.7 % (ref 11.5–15.5)
WBC: 6.8 10*3/uL (ref 4.0–10.5)

## 2022-08-04 LAB — TESTOSTERONE: Testosterone: 259.46 ng/dL — ABNORMAL LOW (ref 300.00–890.00)

## 2022-08-04 LAB — LIPID PANEL
Cholesterol: 192 mg/dL (ref 0–200)
HDL: 33.6 mg/dL — ABNORMAL LOW (ref >39.00)
NonHDL: 158.77
Total CHOL/HDL Ratio: 6
Triglycerides: 286 mg/dL — ABNORMAL HIGH (ref 0.0–149.0)
VLDL: 57.2 mg/dL — ABNORMAL HIGH (ref 0.0–40.0)

## 2022-08-04 LAB — LDL CHOLESTEROL, DIRECT: Direct LDL: 51 mg/dL

## 2022-08-04 MED ORDER — PANTOPRAZOLE SODIUM 20 MG PO TBEC: 20.0000 mg | DELAYED_RELEASE_TABLET | Freq: Every day | ORAL | 1 refills | Status: DC

## 2022-08-04 NOTE — Assessment & Plan Note (Addendum)
Here for CPX OSA: Severe, intolerant to CPAP, plans to discuss with the specialist alternative treatments. Morbid obesity: Not making progress, wegovy on backorder.  Recommend not to let his guard down and continue trying to eat healthy and exercise regularly. Fatigue: Request testosterone check, will do, caveats of results interpretation discussed with patient.   Cough: See last visit, cough improved with PPIs, will decrease pantoprazole from 40 mg to 20 mg.   RTC 4 months.

## 2022-08-04 NOTE — Assessment & Plan Note (Signed)
-  Td 2015 - C-19 vax: pros>> cons  - flu shot today -Had a Colonoscopy in 2008 for rectal bleeding, Cscope 12-2021: 10 years per GI letter -Diet and exercise discussed  -LABS: FLP CBC and testosterone levels per patient request

## 2022-08-04 NOTE — Progress Notes (Signed)
Subjective:    Patient ID: Carl Beltran, male    DOB: August 21, 1976, 46 y.o.   MRN: [id number]  DOS:  08/04/2022 Type of visit - description: CPX  For CPX. In general feels well. Has a history of cough, decreased with  PPIs.  Denies major problems with allergies or postnasal dripping. Complain of fatigue, testosterone check?  No other symptoms.  Wt Readings from Last 3 Encounters:  08/04/22 239 lb 4 oz (108.5 kg)  05/30/22 241 lb 2 oz (109.4 kg)  03/24/22 236 lb 2 oz (107.1 kg)     Review of Systems  Other than above, a 14 point review of systems is negative    Past Medical History:  Diagnosis Date   Adult ADHD 2017   [place], predominantly inattentive presentation   Allergies    cats, hay   Attention deficit hyperactivity disorder (ADHD) 04/24/2016   Depressive personality disorder 2017   [facility]   Erectile dysfunction    History of colon polyps    distal sigmoid and rectal hyperplastic polyps   Sleep apnea    NO CPAP    Past Surgical History:  Procedure Laterality Date   COLONOSCOPY  11/29/2006   Dr.Gessner   KNEE CARTILAGE SURGERY Bilateral 09/20/2018   SHOULDER ARTHROSCOPY WITH BICEPS TENDON REPAIR Left 08/2724   UMBILICAL HERNIA REPAIR     umbilical, at age 48 y/o   Social History   Socioeconomic History   Marital status: Married    Spouse name: Not on file   Number of children: 2   Years of education: Not on file   Highest education level: Bachelor's degree (e.g., BA, AB, BS)  Occupational History   Occupation: IT    Employer: [employer]  Tobacco Use   Smoking status: Former    Years: 3.00    Types: Cigarettes, Cigars   Smokeless tobacco: Never   Tobacco comments:    smoked while in college   Vaping Use   Vaping Use: Never used  Substance and Sexual Activity   Alcohol use: Not Currently    Comment: one everyother week   Drug use: No   Sexual activity: Not on file  Other Topics Concern   Not on file   Social History Narrative   Household-- pt , wife ,2009, 2006      Lives at home with wife & children   Right handed   Caffeine: 1 cup/day   Social Determinants of Health   Financial Resource Strain: Not on file  Food Insecurity: Not on file  Transportation Needs: Not on file  Physical Activity: Not on file  Stress: Not on file  Social Connections: Not on file  Intimate Partner Violence: Not on file    Current Outpatient Medications  Medication Instructions   CINNAMON PO 2 capsules, Oral, Daily   pantoprazole (PROTONIX) 20 mg, Oral, Daily   sildenafil (REVATIO) 20 MG tablet TAKE 3 TO 4 TABLETS BY MOUTH AT BEDTIME AS NEEDED   TURMERIC PO 1 capsule, Oral, Daily   Wegovy 0.5 mg, Subcutaneous, Weekly       Objective:   Physical Exam BP 118/68   Pulse 86   Temp 98 F (36.7 C) (Oral)   Resp 18   Ht 5' 9.25" (1.759 m)   Wt 239 lb 4 oz (108.5 kg)   SpO2 94%   BMI 35.08 kg/m  General: Well developed, NAD, BMI noted Neck: No  thyromegaly  HEENT:  Normocephalic . Face symmetric,  atraumatic Lungs:  CTA B Normal respiratory effort, no intercostal retractions, no accessory muscle use. Heart: RRR,  no murmur.  Abdomen:  Not distended, soft, non-tender. No rebound or rigidity.   Lower extremities: no pretibial edema bilaterally  Skin: Exposed areas without rash. Not pale. Not jaundice Neurologic:  alert & oriented X3.  Speech normal, gait appropriate for age and unassisted Strength symmetric and appropriate for age.  Psych: Cognition and judgment appear intact.  Cooperative with normal attention span and concentration.  Behavior appropriate. No anxious or depressed appearing.     Assessment    ASSESSMENT ADHD (confirmed by psychology 03-2016) Depression (dx by psych 03-2016) ED Tachycardia >> saw cards, Myoview 02-2021, echo 03/2021 negative OSA , severe dx  08-2021, intolerant to CPAP as of 07-2022 Covid infex 09-2019, 07-2021   PLAN: Here for CPX OSA: Severe,  intolerant to CPAP, plans to discuss with the specialist alternative treatments. Morbid obesity: Not making progress, wegovy on backorder.  Recommend not to let his guard down and continue trying to eat healthy and exercise regularly. Fatigue: Request testosterone check, will do, caveats of results interpretation discussed with patient.   Cough: See last visit, cough improved with PPIs, will decrease pantoprazole from 40 mg to 20 mg.   RTC 4 months.

## 2022-08-04 NOTE — Patient Instructions (Signed)
Try to decrease the dose of pantoprazole from 40 mg to 20 mg.   If the cough resurface, go back on 40 mg.   GO TO THE LAB : Get the blood work     [place], [place] back for a checkup in 4 months

## 2022-08-07 ENCOUNTER — Other Ambulatory Visit (HOSPITAL_BASED_OUTPATIENT_CLINIC_OR_DEPARTMENT_OTHER): Payer: Self-pay

## 2022-08-08 NOTE — Addendum Note (Signed)
Addended byDamita Dunnings D on: 08/08/2022 08:13 AM   Modules accepted: Orders

## 2022-08-21 ENCOUNTER — Other Ambulatory Visit (HOSPITAL_BASED_OUTPATIENT_CLINIC_OR_DEPARTMENT_OTHER): Payer: Self-pay

## 2022-08-21 ENCOUNTER — Telehealth: Payer: Managed Care, Other (non HMO) | Admitting: Nurse Practitioner

## 2022-08-21 DIAGNOSIS — J40 Bronchitis, not specified as acute or chronic: Secondary | ICD-10-CM | POA: Diagnosis not present

## 2022-08-21 MED ORDER — DOXYCYCLINE HYCLATE 100 MG PO TABS: 100.0000 mg | ORAL_TABLET | Freq: Two times a day (BID) | ORAL | 0 refills | Status: AC

## 2022-08-21 MED ORDER — PREDNISONE 20 MG PO TABS: 20.0000 mg | ORAL_TABLET | Freq: Every day | ORAL | 0 refills | Status: AC

## 2022-08-21 NOTE — Progress Notes (Signed)
We are sorry that you are not feeling well.  Here is how we plan to help!  Based on your presentation I believe you most likely have A cough due to bacteria.  When patients have a fever and a productive cough with a change in color or increased sputum production, we are concerned about bacterial bronchitis.  If left untreated it can progress to pneumonia.  If your symptoms do not improve with your treatment plan it is important that you contact your provider.   I have prescribed Doxycycline 100 mg twice a day for 7 days     In addition you may use A non-prescription cough medication called Mucinex DM: take 2 tablets every 12 hours.  Prednisone 20 mg daily for 5 days  From your responses in the eVisit questionnaire you describe inflammation in the upper respiratory tract which is causing a significant cough.  This is commonly called Bronchitis and has four common causes:   Allergies Viral Infections Acid Reflux Bacterial Infection Allergies, viruses and acid reflux are treated by controlling symptoms or eliminating the cause. An example might be a cough caused by taking certain blood pressure medications. You stop the cough by changing the medication. Another example might be a cough caused by acid reflux. Controlling the reflux helps control the cough.  USE OF BRONCHODILATOR ("RESCUE") INHALERS: There is a risk from using your bronchodilator too frequently.  The risk is that over-reliance on a medication which only relaxes the muscles surrounding the breathing tubes can reduce the effectiveness of medications prescribed to reduce swelling and congestion of the tubes themselves.  Although you feel brief relief from the bronchodilator inhaler, your asthma may actually be worsening with the tubes becoming more swollen and filled with mucus.  This can delay other crucial treatments, such as oral steroid medications. If you need to use a bronchodilator inhaler daily, several times per day, you should  discuss this with your provider.  There are probably better treatments that could be used to keep your asthma under control.     HOME CARE Only take medications as instructed by your medical team. Complete the entire course of an antibiotic. Drink plenty of fluids and get plenty of rest. Avoid close contacts especially the very young and the elderly Cover your mouth if you cough or cough into your sleeve. Always remember to wash your hands A steam or ultrasonic humidifier can help congestion.   GET HELP RIGHT AWAY IF: You develop worsening fever. You become short of breath You cough up blood. Your symptoms persist after you have completed your treatment plan MAKE SURE YOU  Understand these instructions. Will watch your condition. Will get help right away if you are not doing well or get worse.    Thank you for choosing an e-visit.  Your e-visit answers were reviewed by a board certified advanced clinical practitioner to complete your personal care plan. Depending upon the condition, your plan could have included both over the counter or prescription medications.  Please review your pharmacy choice. Make sure the pharmacy is open so you can pick up prescription now. If there is a problem, you may contact your provider through [employer] and have the prescription routed to another pharmacy.  Your safety is important to [place]. If you have drug allergies check your prescription carefully.   For the next 24 hours you can use MyChart to ask questions about today's visit, request a non-urgent call back, or ask for a work or school excuse.  You will get an email in the next two days asking about your experience. I hope that your e-visit has been valuable and will speed your recovery.   Meds ordered this encounter  Medications   predniSONE (DELTASONE) 20 MG tablet    Sig: Take 1 tablet (20 mg total) by mouth daily with breakfast for 5 days.    Dispense:  5 tablet    Refill:  0    doxycycline (VIBRA-TABS) 100 MG tablet    Sig: Take 1 tablet (100 mg total) by mouth 2 (two) times daily for 7 days.    Dispense:  14 tablet    Refill:  0     I spent approximately 5 minutes reviewing the patient's history, current symptoms and coordinating their plan of care today.

## 2022-08-22 ENCOUNTER — Other Ambulatory Visit (HOSPITAL_BASED_OUTPATIENT_CLINIC_OR_DEPARTMENT_OTHER): Payer: Self-pay

## 2022-08-31 ENCOUNTER — Other Ambulatory Visit (HOSPITAL_BASED_OUTPATIENT_CLINIC_OR_DEPARTMENT_OTHER): Payer: Self-pay

## 2022-10-04 ENCOUNTER — Other Ambulatory Visit (INDEPENDENT_AMBULATORY_CARE_PROVIDER_SITE_OTHER): Payer: Managed Care, Other (non HMO)

## 2022-10-04 DIAGNOSIS — R7989 Other specified abnormal findings of blood chemistry: Secondary | ICD-10-CM

## 2022-10-04 DIAGNOSIS — E291 Testicular hypofunction: Secondary | ICD-10-CM

## 2022-10-04 LAB — LUTEINIZING HORMONE: LH: 2.66 m[IU]/mL (ref 1.50–9.30)

## 2022-10-04 LAB — FOLLICLE STIMULATING HORMONE: FSH: 5.9 m[IU]/mL (ref 1.4–18.1)

## 2022-10-04 LAB — TESTOSTERONE: Testosterone: 252.51 ng/dL — ABNORMAL LOW (ref 300.00–890.00)

## 2022-10-04 NOTE — Addendum Note (Signed)
Addended byDamita Dunnings D on: 10/04/2022 04:30 PM   Modules accepted: Orders

## 2022-10-19 ENCOUNTER — Encounter: Payer: Self-pay | Admitting: Internal Medicine

## 2022-10-19 DIAGNOSIS — R7989 Other specified abnormal findings of blood chemistry: Secondary | ICD-10-CM

## 2022-11-22 ENCOUNTER — Encounter: Payer: Self-pay | Admitting: Internal Medicine

## 2022-11-23 NOTE — Telephone Encounter (Signed)
Please advise patient: Need to re-start with the initial dose the first month. Send Rx Oral medication is not appropriate for weight loss.

## 2022-11-27 ENCOUNTER — Other Ambulatory Visit (HOSPITAL_BASED_OUTPATIENT_CLINIC_OR_DEPARTMENT_OTHER): Payer: Self-pay

## 2022-11-27 MED ORDER — PANTOPRAZOLE SODIUM 20 MG PO TBEC
20.0000 mg | DELAYED_RELEASE_TABLET | Freq: Every day | ORAL | 1 refills | Status: DC
  Filled 2022-11-27: qty 30, 30d supply, fill #0

## 2022-11-27 MED ORDER — WEGOVY 0.25 MG/0.5ML ~~LOC~~ SOAJ
0.2500 mg | SUBCUTANEOUS | 0 refills | Status: DC
  Filled 2022-11-27: qty 2, 28d supply, fill #0

## 2022-11-27 NOTE — Addendum Note (Signed)
Addended byDamita Dunnings D on: 11/27/2022 09:36 AM   Modules accepted: Orders

## 2022-11-27 NOTE — Addendum Note (Signed)
Addended byDamita Dunnings D on: 11/27/2022 09:28 AM   Modules accepted: Orders

## 2022-11-28 ENCOUNTER — Other Ambulatory Visit (HOSPITAL_BASED_OUTPATIENT_CLINIC_OR_DEPARTMENT_OTHER): Payer: Self-pay

## 2022-11-29 ENCOUNTER — Other Ambulatory Visit (HOSPITAL_BASED_OUTPATIENT_CLINIC_OR_DEPARTMENT_OTHER): Payer: Self-pay

## 2022-12-04 ENCOUNTER — Ambulatory Visit: Payer: Managed Care, Other (non HMO) | Admitting: Internal Medicine

## 2022-12-12 ENCOUNTER — Other Ambulatory Visit: Payer: Self-pay

## 2022-12-12 ENCOUNTER — Other Ambulatory Visit (HOSPITAL_BASED_OUTPATIENT_CLINIC_OR_DEPARTMENT_OTHER): Payer: Self-pay

## 2022-12-26 ENCOUNTER — Other Ambulatory Visit (HOSPITAL_BASED_OUTPATIENT_CLINIC_OR_DEPARTMENT_OTHER): Payer: Self-pay

## 2022-12-26 MED ORDER — WEGOVY 0.5 MG/0.5ML ~~LOC~~ SOAJ
0.5000 mg | SUBCUTANEOUS | 0 refills | Status: DC
  Filled 2022-12-26: qty 2, 28d supply, fill #0

## 2022-12-26 NOTE — Addendum Note (Signed)
Addended byDamita Dunnings D on: 12/26/2022 09:39 AM   Modules accepted: Orders

## 2023-01-24 ENCOUNTER — Other Ambulatory Visit (HOSPITAL_BASED_OUTPATIENT_CLINIC_OR_DEPARTMENT_OTHER): Payer: Self-pay

## 2023-01-24 MED ORDER — WEGOVY 1 MG/0.5ML ~~LOC~~ SOAJ
1.0000 mg | SUBCUTANEOUS | 0 refills | Status: DC
  Filled 2023-01-24: qty 2, 28d supply, fill #0

## 2023-01-24 MED ORDER — PANTOPRAZOLE SODIUM 40 MG PO TBEC
40.0000 mg | DELAYED_RELEASE_TABLET | Freq: Every day | ORAL | 0 refills | Status: DC
  Filled 2023-01-24: qty 30, 30d supply, fill #0

## 2023-01-24 NOTE — Telephone Encounter (Signed)
Rx sent. Mychart message sent to Pt instructing him to call and schedule follow-up appt.

## 2023-01-24 NOTE — Telephone Encounter (Signed)
Okay to go back on pantoprazole 40 mg. Needs office visit, please arrange

## 2023-01-24 NOTE — Addendum Note (Signed)
Addended byDamita Dunnings D on: 01/24/2023 04:12 PM   Modules accepted: Orders

## 2023-01-24 NOTE — Addendum Note (Signed)
Addended byDamita Dunnings D on: 01/24/2023 10:13 AM   Modules accepted: Orders

## 2023-01-25 ENCOUNTER — Other Ambulatory Visit (HOSPITAL_BASED_OUTPATIENT_CLINIC_OR_DEPARTMENT_OTHER): Payer: Self-pay

## 2023-01-29 ENCOUNTER — Encounter: Payer: Self-pay | Admitting: Internal Medicine

## 2023-01-29 ENCOUNTER — Other Ambulatory Visit (HOSPITAL_BASED_OUTPATIENT_CLINIC_OR_DEPARTMENT_OTHER): Payer: Self-pay

## 2023-01-29 NOTE — Telephone Encounter (Signed)
Duplicate message. 

## 2023-01-30 ENCOUNTER — Other Ambulatory Visit (HOSPITAL_BASED_OUTPATIENT_CLINIC_OR_DEPARTMENT_OTHER): Payer: Self-pay

## 2023-01-31 ENCOUNTER — Other Ambulatory Visit (HOSPITAL_BASED_OUTPATIENT_CLINIC_OR_DEPARTMENT_OTHER): Payer: Self-pay

## 2023-02-01 ENCOUNTER — Other Ambulatory Visit (HOSPITAL_BASED_OUTPATIENT_CLINIC_OR_DEPARTMENT_OTHER): Payer: Self-pay

## 2023-02-01 ENCOUNTER — Telehealth: Payer: Self-pay

## 2023-02-01 NOTE — Telephone Encounter (Signed)
PA approved. Effective 02/01/2023 to 09/03/2023

## 2023-02-01 NOTE — Telephone Encounter (Signed)
PA initiated via Covermymeds; KEY: [id number]. Awaiting determination.

## 2023-02-16 ENCOUNTER — Other Ambulatory Visit (HOSPITAL_BASED_OUTPATIENT_CLINIC_OR_DEPARTMENT_OTHER): Payer: Self-pay

## 2023-02-16 ENCOUNTER — Encounter: Payer: Self-pay | Admitting: Internal Medicine

## 2023-02-16 MED ORDER — PANTOPRAZOLE SODIUM 40 MG PO TBEC
40.0000 mg | DELAYED_RELEASE_TABLET | Freq: Every day | ORAL | 0 refills | Status: DC
  Filled 2023-02-16: qty 30, 30d supply, fill #0

## 2023-02-16 MED ORDER — WEGOVY 1.7 MG/0.75ML ~~LOC~~ SOAJ
1.7000 mg | SUBCUTANEOUS | 0 refills | Status: DC
  Filled 2023-02-16: qty 3, 28d supply, fill #0

## 2023-02-16 NOTE — Addendum Note (Signed)
Addended byConrad Bevier D on: 02/16/2023 11:26 AM   Modules accepted: Orders

## 2023-03-05 ENCOUNTER — Ambulatory Visit: Payer: Managed Care, Other (non HMO) | Admitting: Internal Medicine

## 2023-03-05 ENCOUNTER — Encounter: Payer: Self-pay | Admitting: Internal Medicine

## 2023-03-05 DIAGNOSIS — G4733 Obstructive sleep apnea (adult) (pediatric): Secondary | ICD-10-CM

## 2023-03-05 MED ORDER — PANTOPRAZOLE SODIUM 20 MG PO TBEC: 20.0000 mg | DELAYED_RELEASE_TABLET | Freq: Every day | ORAL | 1 refills | Status: DC

## 2023-03-05 NOTE — Patient Instructions (Addendum)
Vaccines I recommend: Covid booster       GO TO THE FRONT DESK, PLEASE SCHEDULE YOUR APPOINTMENTS Come back for   a physical by 07-2023

## 2023-03-05 NOTE — Progress Notes (Unsigned)
Subjective:    Patient ID: Carl Beltran, male    DOB: Nov 06, 1976, 47 y.o.   MRN: [id number]  DOS:  03/05/2023 Type of visit - description: Follow-up  Follow-up from previous visit. We talk about morbid obesity, low testosterone, sleep apnea and GERD. In general feeling well.  + Weight loss on Wegovy.  Last week has some difficult time "popping"  the left ear after he flew from [place], no ear pain or discharge.  Infection?  Wt Readings from Last 3 Encounters:  03/05/23 225 lb 8 oz (102.3 kg)  08/04/22 239 lb 4 oz (108.5 kg)  05/30/22 241 lb 2 oz (109.4 kg)    Review of Systems See above   Past Medical History:  Diagnosis Date   Adult ADHD 2017   [facility], predominantly inattentive presentation   Allergies    cats, hay   Attention deficit hyperactivity disorder (ADHD) 04/24/2016   Depressive personality disorder 2017   [facility]   Erectile dysfunction    History of colon polyps    distal sigmoid and rectal hyperplastic polyps   Sleep apnea    NO CPAP    Past Surgical History:  Procedure Laterality Date   COLONOSCOPY  11/29/2006   Dr.Gessner   KNEE CARTILAGE SURGERY Bilateral 09/20/2018   SHOULDER ARTHROSCOPY WITH BICEPS TENDON REPAIR Left 09/2021   UMBILICAL HERNIA REPAIR     umbilical, at age 38 y/o    Current Outpatient Medications  Medication Instructions   pantoprazole (PROTONIX) 20 mg, Oral, Daily   sildenafil (REVATIO) 20 MG tablet TAKE 3 TO 4 TABLETS BY MOUTH AT BEDTIME AS NEEDED   Wegovy 1.7 mg, Subcutaneous, Weekly       Objective:   Physical Exam BP 108/74   Pulse (!) 112   Temp 97.8 F (36.6 C) (Oral)   Resp 16   Ht 5' 9.25" (1.759 m)   Wt 225 lb 8 oz (102.3 kg)   SpO2 96%   BMI 33.06 kg/m  General:   Well developed, NAD, BMI noted. HEENT:  Normocephalic . Face symmetric, atraumatic. TMs: Slightly bulged but symmetric without redness Lungs:  CTA B Normal respiratory effort, no intercostal retractions,  no accessory muscle use. Heart: RRR,  no murmur.  Lower extremities: no pretibial edema bilaterally  Skin: Not pale. Not jaundice Neurologic:  alert & oriented X3.  Speech normal, gait appropriate for age and unassisted Psych--  Cognition and judgment appear intact.  Cooperative with normal attention span and concentration.  Behavior appropriate. No anxious or depressed appearing.      Assessment    ASSESSMENT ADHD (confirmed by psychology 03-2016) Depression (dx by psych 03-2016) ED Tachycardia >> saw cards, Myoview 02-2021, echo 03/2021 negative OSA , severe dx  08-2021, intolerant to CPAP as of 07-2022   PLAN: Morbid obesity: Started Wegovy several months ago, at home scales has lost about 30 pounds from his peak weight.  At this office the wt loss is ~ 15 pounds.  He is taking Wegovy 1.7  mg weekly, currently with no side effects.  Has changed his portion sizes and is trying to eat more vegetables.  Encouraged to develop new habits and increase his physical activity OSA: CPAP intolerance, since he has lost weight wife told him that his snoring much less and his energy level is better. Low testosterone:  levels were low twice with normal LH, FSH, to see Endo next month. Ear infection?  Exam negative. GERD: Well-controlled, suspect he will continue  to do well even with a lower dose, change pantoprazole to 20 mg. RTC 5 months CPX

## 2023-03-06 NOTE — Assessment & Plan Note (Signed)
Morbid obesity: Carl Beltran several months ago, at home scales has lost about 30 pounds from his peak weight.  At this office the wt loss is ~ 15 pounds.  He is taking Wegovy 1.7  mg weekly, currently with no side effects.  Has changed his portion sizes and is trying to eat more vegetables.  Encouraged to develop new habits and increase his physical activity OSA: CPAP intolerance, since he has lost weight wife told him that his snoring much less and his energy level is better. Low testosterone:  levels were low twice with normal LH, FSH, to see Endo next month. Ear infection?  Exam negative. GERD: Well-controlled, suspect he will continue to do well even with a lower dose, change pantoprazole to 20 mg. RTC 5 months CPX

## 2023-03-29 ENCOUNTER — Encounter: Payer: Self-pay | Admitting: Internal Medicine

## 2023-03-29 ENCOUNTER — Other Ambulatory Visit (HOSPITAL_BASED_OUTPATIENT_CLINIC_OR_DEPARTMENT_OTHER): Payer: Self-pay

## 2023-03-29 MED ORDER — WEGOVY 2.4 MG/0.75ML ~~LOC~~ SOAJ
2.4000 mg | SUBCUTANEOUS | 3 refills | Status: DC
  Filled 2023-03-29: qty 3, 28d supply, fill #0

## 2023-03-30 ENCOUNTER — Encounter: Payer: Self-pay | Admitting: Internal Medicine

## 2023-03-30 MED ORDER — BENZONATATE 200 MG PO CAPS: 200.0000 mg | ORAL_CAPSULE | Freq: Three times a day (TID) | ORAL | 0 refills | Status: DC | PRN

## 2023-04-25 ENCOUNTER — Other Ambulatory Visit (HOSPITAL_BASED_OUTPATIENT_CLINIC_OR_DEPARTMENT_OTHER): Payer: Self-pay

## 2023-04-25 MED ORDER — PANTOPRAZOLE SODIUM 20 MG PO TBEC
20.0000 mg | DELAYED_RELEASE_TABLET | Freq: Every day | ORAL | 1 refills | Status: DC
  Filled 2023-04-25: qty 30, 30d supply, fill #0
  Filled 2023-05-24: qty 30, 30d supply, fill #1
  Filled 2023-06-20: qty 30, 30d supply, fill #2
  Filled 2023-07-23: qty 30, 30d supply, fill #3
  Filled 2023-08-22: qty 30, 30d supply, fill #4

## 2023-04-25 MED ORDER — WEGOVY 2.4 MG/0.75ML ~~LOC~~ SOAJ
2.4000 mg | SUBCUTANEOUS | 3 refills | Status: DC
  Filled 2023-04-25: qty 3, 28d supply, fill #0
  Filled 2023-05-24: qty 3, 28d supply, fill #1
  Filled 2023-06-20: qty 3, 28d supply, fill #2
  Filled 2023-07-23: qty 3, 28d supply, fill #3

## 2023-05-01 ENCOUNTER — Other Ambulatory Visit: Payer: Self-pay | Admitting: Internal Medicine

## 2023-06-19 ENCOUNTER — Other Ambulatory Visit: Payer: Self-pay | Admitting: Internal Medicine

## 2023-06-20 ENCOUNTER — Other Ambulatory Visit: Payer: Self-pay | Admitting: Internal Medicine

## 2023-06-20 NOTE — Telephone Encounter (Signed)
Prescribed 05/01/2023 #100 no refills, please advise.

## 2023-06-22 ENCOUNTER — Other Ambulatory Visit (HOSPITAL_BASED_OUTPATIENT_CLINIC_OR_DEPARTMENT_OTHER): Payer: Self-pay

## 2023-06-22 MED ORDER — SILDENAFIL CITRATE 20 MG PO TABS
60.0000 mg | ORAL_TABLET | Freq: Every evening | ORAL | 0 refills | Status: DC | PRN
  Filled 2023-06-22: qty 100, 25d supply, fill #0

## 2023-06-25 ENCOUNTER — Other Ambulatory Visit (HOSPITAL_BASED_OUTPATIENT_CLINIC_OR_DEPARTMENT_OTHER): Payer: Self-pay

## 2023-06-25 ENCOUNTER — Telehealth: Payer: Self-pay | Admitting: Pharmacy Technician

## 2023-06-25 ENCOUNTER — Other Ambulatory Visit (HOSPITAL_COMMUNITY): Payer: Self-pay

## 2023-06-25 NOTE — Telephone Encounter (Signed)
Pharmacy Patient Advocate Encounter   Received notification from CoverMyMeds that prior authorization for Sildenafil Citrate 20MG  tablets is required/requested.   Insurance verification completed.   The patient is insured through [facility] .   Per test claim: PA required; PA submitted to [facility] via CoverMyMeds Key/confirmation #/EOC [id number] Status is pending

## 2023-06-25 NOTE — Telephone Encounter (Signed)
Noted  

## 2023-06-25 NOTE — Telephone Encounter (Signed)
Pharmacy Patient Advocate Encounter  Received notification from [facility] that Prior Authorization for Sildenafil Citrate 20MG  tablets has been DENIED. Please advise how you'd like to proceed. Full denial letter will be uploaded to the media tab. See denial reason below.   PA #/Case ID/Reference #: [id number]

## 2023-06-26 ENCOUNTER — Other Ambulatory Visit (HOSPITAL_BASED_OUTPATIENT_CLINIC_OR_DEPARTMENT_OTHER): Payer: Self-pay

## 2023-06-27 ENCOUNTER — Other Ambulatory Visit (HOSPITAL_BASED_OUTPATIENT_CLINIC_OR_DEPARTMENT_OTHER): Payer: Self-pay

## 2023-06-28 ENCOUNTER — Other Ambulatory Visit (HOSPITAL_BASED_OUTPATIENT_CLINIC_OR_DEPARTMENT_OTHER): Payer: Self-pay

## 2023-06-29 ENCOUNTER — Other Ambulatory Visit (HOSPITAL_BASED_OUTPATIENT_CLINIC_OR_DEPARTMENT_OTHER): Payer: Self-pay

## 2023-07-02 ENCOUNTER — Other Ambulatory Visit (HOSPITAL_BASED_OUTPATIENT_CLINIC_OR_DEPARTMENT_OTHER): Payer: Self-pay

## 2023-07-03 ENCOUNTER — Other Ambulatory Visit (HOSPITAL_BASED_OUTPATIENT_CLINIC_OR_DEPARTMENT_OTHER): Payer: Self-pay

## 2023-07-10 ENCOUNTER — Other Ambulatory Visit (HOSPITAL_BASED_OUTPATIENT_CLINIC_OR_DEPARTMENT_OTHER): Payer: Self-pay

## 2023-07-11 ENCOUNTER — Other Ambulatory Visit (HOSPITAL_BASED_OUTPATIENT_CLINIC_OR_DEPARTMENT_OTHER): Payer: Self-pay

## 2023-07-17 ENCOUNTER — Other Ambulatory Visit (HOSPITAL_BASED_OUTPATIENT_CLINIC_OR_DEPARTMENT_OTHER): Payer: Self-pay

## 2023-07-18 ENCOUNTER — Other Ambulatory Visit (HOSPITAL_BASED_OUTPATIENT_CLINIC_OR_DEPARTMENT_OTHER): Payer: Self-pay

## 2023-07-19 ENCOUNTER — Other Ambulatory Visit (HOSPITAL_BASED_OUTPATIENT_CLINIC_OR_DEPARTMENT_OTHER): Payer: Self-pay

## 2023-07-23 ENCOUNTER — Other Ambulatory Visit (HOSPITAL_BASED_OUTPATIENT_CLINIC_OR_DEPARTMENT_OTHER): Payer: Self-pay

## 2023-08-14 ENCOUNTER — Encounter: Payer: Managed Care, Other (non HMO) | Admitting: Internal Medicine

## 2023-08-22 ENCOUNTER — Other Ambulatory Visit (HOSPITAL_BASED_OUTPATIENT_CLINIC_OR_DEPARTMENT_OTHER): Payer: Self-pay

## 2023-08-22 ENCOUNTER — Encounter (HOSPITAL_BASED_OUTPATIENT_CLINIC_OR_DEPARTMENT_OTHER): Payer: Self-pay

## 2023-08-22 ENCOUNTER — Other Ambulatory Visit: Payer: Self-pay | Admitting: Internal Medicine

## 2023-08-22 ENCOUNTER — Encounter: Payer: Self-pay | Admitting: Internal Medicine

## 2023-08-22 MED ORDER — PANTOPRAZOLE SODIUM 20 MG PO TBEC
20.0000 mg | DELAYED_RELEASE_TABLET | Freq: Every day | ORAL | 1 refills | Status: DC
Start: 2023-08-22 — End: 2024-02-19
  Filled 2023-08-22: qty 30, 30d supply, fill #0
  Filled 2023-09-17: qty 30, 30d supply, fill #1
  Filled 2023-10-26: qty 30, 30d supply, fill #2
  Filled 2023-11-21: qty 30, 30d supply, fill #3
  Filled 2023-12-25: qty 30, 30d supply, fill #4
  Filled 2024-01-24: qty 30, 30d supply, fill #5

## 2023-08-22 MED ORDER — SILDENAFIL CITRATE 20 MG PO TABS: 60.0000 mg | ORAL_TABLET | Freq: Every evening | ORAL | 0 refills | Status: DC | PRN

## 2023-08-22 MED ORDER — WEGOVY 2.4 MG/0.75ML ~~LOC~~ SOAJ
2.4000 mg | SUBCUTANEOUS | 3 refills | Status: DC
Start: 2023-08-22 — End: 2023-12-25
  Filled 2023-08-22: qty 3, 28d supply, fill #0
  Filled 2023-09-17 – 2023-09-24 (×2): qty 3, 28d supply, fill #1
  Filled 2023-10-26: qty 3, 28d supply, fill #2
  Filled 2023-11-21: qty 3, 28d supply, fill #3

## 2023-09-18 ENCOUNTER — Ambulatory Visit (INDEPENDENT_AMBULATORY_CARE_PROVIDER_SITE_OTHER): Payer: Managed Care, Other (non HMO) | Admitting: Internal Medicine

## 2023-09-18 ENCOUNTER — Telehealth: Payer: Managed Care, Other (non HMO)

## 2023-09-18 ENCOUNTER — Other Ambulatory Visit (HOSPITAL_BASED_OUTPATIENT_CLINIC_OR_DEPARTMENT_OTHER): Payer: Self-pay

## 2023-09-18 ENCOUNTER — Encounter: Payer: Self-pay | Admitting: Internal Medicine

## 2023-09-18 VITALS — BP 126/64 | HR 96 | Temp 97.8°F | Resp 16 | Ht 69.25 in | Wt 195.5 lb

## 2023-09-18 DIAGNOSIS — Z0001 Encounter for general adult medical examination with abnormal findings: Secondary | ICD-10-CM

## 2023-09-18 DIAGNOSIS — E669 Obesity, unspecified: Secondary | ICD-10-CM | POA: Diagnosis not present

## 2023-09-18 DIAGNOSIS — Z0000 Encounter for general adult medical examination without abnormal findings: Secondary | ICD-10-CM | POA: Diagnosis not present

## 2023-09-18 DIAGNOSIS — R7989 Other specified abnormal findings of blood chemistry: Secondary | ICD-10-CM | POA: Diagnosis not present

## 2023-09-18 NOTE — Telephone Encounter (Signed)
PA initiated via Covermymeds; KEY: [id number]. Awaiting determination.

## 2023-09-18 NOTE — Patient Instructions (Addendum)
   Scheduled to be blood work early in the morning in the next few days. Next visit with me 6 months. Please schedule it at the front desk

## 2023-09-18 NOTE — Progress Notes (Unsigned)
Subjective:    Patient ID: Carl Beltran, male    DOB: Jan 18, 1976, 47 y.o.   MRN: [id number]  DOS:  09/18/2023 Type of visit - description: cpx Here for CPX. Has no major concerns.   Wt Readings from Last 3 Encounters:  09/18/23 195 lb 8 oz (88.7 kg)  03/05/23 225 lb 8 oz (102.3 kg)  08/04/22 239 lb 4 oz (108.5 kg)      Review of Systems See above   Past Medical History:  Diagnosis Date   Adult ADHD 2017   [facility], predominantly inattentive presentation   Allergies    cats, hay   Attention deficit hyperactivity disorder (ADHD) 04/24/2016   Depressive personality disorder 2017   [facility]   Erectile dysfunction    History of colon polyps    distal sigmoid and rectal hyperplastic polyps   Sleep apnea    NO CPAP    Past Surgical History:  Procedure Laterality Date   COLONOSCOPY  11/29/2006   Dr.Gessner   KNEE CARTILAGE SURGERY Bilateral 09/20/2018   SHOULDER ARTHROSCOPY WITH BICEPS TENDON REPAIR Left 09/2021   UMBILICAL HERNIA REPAIR     umbilical, at age 69 y/o    Current Outpatient Medications  Medication Instructions   benzonatate (TESSALON) 200 mg, Oral, 3 times daily PRN   pantoprazole (PROTONIX) 20 mg, Oral, Daily   sildenafil (REVATIO) 60-80 mg, Oral, At bedtime PRN   Wegovy 2.4 mg, Subcutaneous, Weekly       Objective:   Physical Exam BP 126/64   Pulse 96   Temp 97.8 F (36.6 C) (Oral)   Resp 16   Ht 5' 9.25" (1.759 m)   Wt 195 lb 8 oz (88.7 kg)   SpO2 96%   BMI 28.66 kg/m  General: Well developed, NAD, BMI noted Neck: No  thyromegaly  HEENT:  Normocephalic . Face symmetric, atraumatic Lungs:  CTA B Normal respiratory effort, no intercostal retractions, no accessory muscle use. Heart: RRR,  no murmur.  Abdomen:  Not distended, soft, non-tender. No rebound or rigidity.   Lower extremities: no pretibial edema bilaterally  Skin: Exposed areas without rash. Not pale. Not jaundice Neurologic:  alert &  oriented X3.  Speech normal, gait appropriate for age and unassisted Strength symmetric and appropriate for age.  Psych: Cognition and judgment appear intact.  Cooperative with normal attention span and concentration.  Behavior appropriate. No anxious or depressed appearing.     Assessment     ASSESSMENT ADHD (confirmed by psychology 03-2016) Depression (dx by psych 03-2016) ED Tachycardia >> saw cards, Myoview 02-2021, echo 03/2021 negative OSA , severe dx  08-2021, intolerant to CPAP as of 07-2022   PLAN: Here for CPX. - Td 2015 - Had a flu shot - Rec covid vax  pros>cons  -CCS: Had a Colonoscopy in 2008 for rectal bleeding, Cscope 12-2021: 10 years per GI letter - Prostate cancer screening: Would like to start screening, no symptoms, check PSA.=, -Diet and exercise: See comments under obesity  -LABS: CMP FLP CBC PSA testosterone TSH.  Obesity: Doing great on Wegovy, starting weight 241 pounds on 05/2022, current weight 195 pounds.  Remains active, exercise at least 3 times a week, plays golf.  Trying to eat healthy. OSA: CPAP intolerant, energy much improved. 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  Referral to Endo failed.  Is essentially asx,   agreed to recheck a testosterone level. RTC labs early in the morning  at his convenience RTC 6 months   Morbid obesity: Started [place] several months ago, at home scales has lost about 30 pounds from his peak weight.  At this office the wt loss is ~ 15 pounds.  He is taking Wegovy 1.7  mg weekly, currently with no side effects.  Has changed his portion sizes and is trying to eat more vegetables.  Encouraged to develop new habits and increase his physical activity OSA: CPAP intolerance, since he has lost weight wife told him that his snoring much less and his energy level is better. Low testosterone:  levels were low twice with normal LH, FSH, to see Endo next month. Ear infection?  Exam  negative. GERD: Well-controlled, suspect he will continue to do well even with a lower dose, change pantoprazole to 20 mg. RTC 5 months CPX

## 2023-09-19 ENCOUNTER — Encounter: Payer: Self-pay | Admitting: Internal Medicine

## 2023-09-19 ENCOUNTER — Other Ambulatory Visit: Payer: Managed Care, Other (non HMO)

## 2023-09-19 NOTE — Assessment & Plan Note (Signed)
Here for CPX. Obesity: Doing great on Wegovy, starting weight 241 pounds on 05/2022, current weight 195 pounds.  Remains active, exercise at least 3 times a week, plays golf.  Trying to eat healthy. OSA: CPAP intolerant, energy much improved. 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   Referral to Endo failed.  Remains asx but we agreed to recheck a T level. RTC labs early in the morning at his convenience RTC 6 months

## 2023-09-19 NOTE — Assessment & Plan Note (Signed)
Here for CPX. - Td 2015 - Had a flu shot - Rec covid vax  pros>cons  -CCS: Had a Colonoscopy in 2008 for rectal bleeding, Cscope 12-2021: 10 years per GI letter - Prostate cancer screening: Would like to start screening, no symptoms, check PSA  -Diet and exercise: See comments under obesity  -LABS: CMP FLP CBC PSA testosterone TSH.

## 2023-09-20 NOTE — Telephone Encounter (Signed)
Spoke w/ Sam at [place] [place] [phone]- they received PA but was blank d/t Covermymeds issues since Monday, she will have the blank form faxed to [place].

## 2023-09-20 NOTE — Telephone Encounter (Addendum)
PA form received, completed and faxed back to [facility] at [phone]. Awaiting determination.

## 2023-09-21 ENCOUNTER — Other Ambulatory Visit (HOSPITAL_BASED_OUTPATIENT_CLINIC_OR_DEPARTMENT_OTHER): Payer: Self-pay

## 2023-09-21 ENCOUNTER — Other Ambulatory Visit (INDEPENDENT_AMBULATORY_CARE_PROVIDER_SITE_OTHER): Payer: Managed Care, Other (non HMO)

## 2023-09-21 ENCOUNTER — Other Ambulatory Visit: Payer: Self-pay

## 2023-09-21 DIAGNOSIS — E669 Obesity, unspecified: Secondary | ICD-10-CM

## 2023-09-21 DIAGNOSIS — R7989 Other specified abnormal findings of blood chemistry: Secondary | ICD-10-CM | POA: Diagnosis not present

## 2023-09-21 DIAGNOSIS — Z125 Encounter for screening for malignant neoplasm of prostate: Secondary | ICD-10-CM

## 2023-09-21 DIAGNOSIS — Z0000 Encounter for general adult medical examination without abnormal findings: Secondary | ICD-10-CM

## 2023-09-21 LAB — CBC WITH DIFFERENTIAL/PLATELET
Basophils Absolute: 0.1 10*3/uL (ref 0.0–0.1)
Basophils Relative: 0.8 % (ref 0.0–3.0)
Eosinophils Absolute: 0.1 10*3/uL (ref 0.0–0.7)
Eosinophils Relative: 1.3 % (ref 0.0–5.0)
HCT: 48.2 % (ref 39.0–52.0)
Hemoglobin: 16.2 g/dL (ref 13.0–17.0)
Lymphocytes Relative: 26.5 % (ref 12.0–46.0)
Lymphs Abs: 1.7 10*3/uL (ref 0.7–4.0)
MCHC: 33.6 g/dL (ref 30.0–36.0)
MCV: 88.8 fL (ref 78.0–100.0)
Monocytes Absolute: 0.6 10*3/uL (ref 0.1–1.0)
Monocytes Relative: 9.8 % (ref 3.0–12.0)
Neutro Abs: 4.1 10*3/uL (ref 1.4–7.7)
Neutrophils Relative %: 61.6 % (ref 43.0–77.0)
Platelets: 371 10*3/uL (ref 150.0–400.0)
RBC: 5.43 Mil/uL (ref 4.22–5.81)
RDW: 13.9 % (ref 11.5–15.5)
WBC: 6.6 10*3/uL (ref 4.0–10.5)

## 2023-09-21 LAB — COMPREHENSIVE METABOLIC PANEL
ALT: 17 U/L (ref 0–53)
AST: 14 U/L (ref 0–37)
Albumin: 4.5 g/dL (ref 3.5–5.2)
Alkaline Phosphatase: 69 U/L (ref 39–117)
BUN: 12 mg/dL (ref 6–23)
CO2: 29 meq/L (ref 19–32)
Calcium: 9.4 mg/dL (ref 8.4–10.5)
Chloride: 104 meq/L (ref 96–112)
Creatinine, Ser: 1.21 mg/dL (ref 0.40–1.50)
GFR: 71.39 mL/min (ref >60.00)
Glucose, Bld: 86 mg/dL (ref 70–99)
Potassium: 4.4 meq/L (ref 3.5–5.1)
Sodium: 141 meq/L (ref 135–145)
Total Bilirubin: 0.9 mg/dL (ref 0.2–1.2)
Total Protein: 6.9 g/dL (ref 6.0–8.3)

## 2023-09-21 LAB — PSA: PSA: 1.25 ng/mL (ref 0.10–4.00)

## 2023-09-21 LAB — LIPID PANEL
Cholesterol: 96 mg/dL (ref 0–200)
HDL: 27.5 mg/dL — ABNORMAL LOW (ref >39.00)
LDL Cholesterol: 44 mg/dL (ref 0–99)
NonHDL: 68.11
Total CHOL/HDL Ratio: 3
Triglycerides: 123 mg/dL (ref 0.0–149.0)
VLDL: 24.6 mg/dL (ref 0.0–40.0)

## 2023-09-21 LAB — TESTOSTERONE: Testosterone: >1600 ng/dL — ABNORMAL HIGH (ref 300.00–890.00)

## 2023-09-21 LAB — TSH: TSH: 2.19 u[IU]/mL (ref 0.35–5.50)

## 2023-09-24 ENCOUNTER — Other Ambulatory Visit (HOSPITAL_BASED_OUTPATIENT_CLINIC_OR_DEPARTMENT_OTHER): Payer: Self-pay

## 2023-09-24 NOTE — Telephone Encounter (Signed)
PA approved. Effective 09/21/23 to 09/20/24.

## 2023-09-25 ENCOUNTER — Encounter: Payer: Self-pay | Admitting: Internal Medicine

## 2023-11-10 ENCOUNTER — Encounter: Payer: Self-pay | Admitting: Internal Medicine

## 2023-11-10 ENCOUNTER — Other Ambulatory Visit: Payer: Self-pay | Admitting: Internal Medicine

## 2023-11-12 MED ORDER — SILDENAFIL CITRATE 20 MG PO TABS: 60.0000 mg | ORAL_TABLET | Freq: Every evening | ORAL | 0 refills | Status: DC | PRN

## 2023-12-25 ENCOUNTER — Other Ambulatory Visit: Payer: Self-pay | Admitting: Internal Medicine

## 2023-12-26 ENCOUNTER — Other Ambulatory Visit (HOSPITAL_BASED_OUTPATIENT_CLINIC_OR_DEPARTMENT_OTHER): Payer: Self-pay

## 2023-12-26 ENCOUNTER — Other Ambulatory Visit: Payer: Self-pay

## 2023-12-26 ENCOUNTER — Encounter (HOSPITAL_BASED_OUTPATIENT_CLINIC_OR_DEPARTMENT_OTHER): Payer: Self-pay

## 2023-12-26 MED ORDER — WEGOVY 2.4 MG/0.75ML ~~LOC~~ SOAJ
2.4000 mg | SUBCUTANEOUS | 3 refills | Status: DC
  Filled 2023-12-26: qty 3, 28d supply, fill #0
  Filled 2024-01-24: qty 3, 28d supply, fill #1
  Filled 2024-02-19: qty 3, 28d supply, fill #2
  Filled 2024-03-16: qty 3, 28d supply, fill #3

## 2024-02-12 ENCOUNTER — Other Ambulatory Visit: Payer: Self-pay | Admitting: Internal Medicine

## 2024-02-14 ENCOUNTER — Encounter: Payer: Self-pay | Admitting: Internal Medicine

## 2024-02-18 ENCOUNTER — Other Ambulatory Visit: Payer: Self-pay

## 2024-02-19 ENCOUNTER — Other Ambulatory Visit (HOSPITAL_BASED_OUTPATIENT_CLINIC_OR_DEPARTMENT_OTHER): Payer: Self-pay

## 2024-02-19 ENCOUNTER — Ambulatory Visit: Admitting: Internal Medicine

## 2024-02-19 ENCOUNTER — Other Ambulatory Visit: Payer: Self-pay | Admitting: Internal Medicine

## 2024-02-19 VITALS — BP 136/82 | HR 98 | Temp 98.4°F | Resp 16 | Ht 69.25 in | Wt 196.4 lb

## 2024-02-19 DIAGNOSIS — E669 Obesity, unspecified: Secondary | ICD-10-CM

## 2024-02-19 DIAGNOSIS — H9201 Otalgia, right ear: Secondary | ICD-10-CM | POA: Diagnosis not present

## 2024-02-19 DIAGNOSIS — R42 Dizziness and giddiness: Secondary | ICD-10-CM

## 2024-02-19 DIAGNOSIS — H9191 Unspecified hearing loss, right ear: Secondary | ICD-10-CM | POA: Diagnosis not present

## 2024-02-19 MED ORDER — PANTOPRAZOLE SODIUM 20 MG PO TBEC
20.0000 mg | DELAYED_RELEASE_TABLET | Freq: Every day | ORAL | 1 refills | Status: DC
  Filled 2024-02-19: qty 30, 30d supply, fill #0
  Filled 2024-03-16: qty 30, 30d supply, fill #1
  Filled 2024-04-16: qty 30, 30d supply, fill #2
  Filled 2024-05-11: qty 30, 30d supply, fill #3
  Filled 2024-06-10: qty 30, 30d supply, fill #4
  Filled 2024-07-10: qty 30, 30d supply, fill #5

## 2024-02-19 NOTE — Patient Instructions (Addendum)
 We are referring you to Dr. Darlin Ehrlich regards dizziness  See you in May for your physical

## 2024-02-19 NOTE — Progress Notes (Unsigned)
 Subjective:    Patient ID: Carl Beltran, male    DOB: 01/06/1976, 48 y.o.   MRN: [id number]  DOS:  02/19/2024 Type of visit - description: Acute  About 1 or 2 years history of dizziness when he stands up from chair or from bed. Used to be mild and he was able to walk and do his normal activity. Now, the dizziness is at least moderate, he has to stand up, wait few seconds and then he is able to walk. Symptoms are not described as dizziness, "drunk feeling", spinning.  In addition, he has some ringing at the right ear along with slightly decreased hearing on that side.  Denies chest pain or difficulty breathing. No palpitations. No headaches or diplopia.   Wt Readings from Last 3 Encounters:  02/19/24 196 lb 6 oz (89.1 kg)  09/18/23 195 lb 8 oz (88.7 kg)  03/05/23 225 lb 8 oz (102.3 kg)     Review of Systems See above   Past Medical History:  Diagnosis Date   Adult ADHD 2017   [facility], predominantly inattentive presentation   Allergies    cats, hay   Attention deficit hyperactivity disorder (ADHD) 04/24/2016   Depressive personality disorder 2017   [facility]   Erectile dysfunction    History of colon polyps    distal sigmoid and rectal hyperplastic polyps   Sleep apnea    NO CPAP    Past Surgical History:  Procedure Laterality Date   COLONOSCOPY  11/29/2006   Dr.Gessner   KNEE CARTILAGE SURGERY Bilateral 09/20/2018   SHOULDER ARTHROSCOPY WITH BICEPS TENDON REPAIR Left 09/2021   UMBILICAL HERNIA REPAIR     umbilical, at age 51 y/o    Current Outpatient Medications  Medication Instructions   pantoprazole (PROTONIX) 20 mg, Oral, Daily   phentermine (ADIPEX-P) 37.5 mg, Every morning   sildenafil (REVATIO) 20 MG tablet TAKE 3-4 TABLETS BY MOUTH EVERY DAY AT BEDTIME AS NEEDED   Wegovy 2.4 mg, Subcutaneous, Weekly       Objective:   Physical Exam BP 136/82   Pulse 98   Temp 98.4 F (36.9 C) (Oral)   Resp 16   Ht 5' 9.25"  (1.759 m)   Wt 196 lb 6 oz (89.1 kg)   SpO2 97%   BMI 28.79 kg/m  General:   Well developed, NAD, BMI noted. HEENT:  Normocephalic . Face symmetric, atraumatic.  Not pale or jaundiced. TMs normal. Hearing grossly normal. Lungs:  CTA B Normal respiratory effort, no intercostal retractions, no accessory muscle use. Heart: RRR,  no murmur.  Lower extremities: no pretibial edema bilaterally  Skin: Not pale. Not jaundice Neurologic:  alert & oriented X3.  Speech normal, gait appropriate for age and unassisted Psych--  Cognition and judgment appear intact.  Cooperative with normal attention span and concentration.  Behavior appropriate. No anxious or depressed appearing.      Assessment     ASSESSMENT ADHD (confirmed by psychology 03-2016) Depression (dx by psych 03-2016) ED Tachycardia >> saw cards, Myoview 02-2021, echo 03/2021 negative OSA , severe dx  08-2021, intolerant to CPAP as of 07-2022  PLAN: Dizziness: As described above, positional, getting worse.  Also having some tinnitus and hearing loss on the right side. No cardiopulmonary symptoms. Last CBC normal. Orthostatic signs: Negative Request a ENT referral which is appropriate, will arrange. Obesity, weight loss management: On Wegovy, today he reports has been taking Adipex for at least a year, on and off, apparently  the dizziness does not change whether or not he is taking Adipex. Advised patient that in my opinion he needs to stop Adipex and continue Wegovy due to potential adverse effects from Adipex including tachycardia and other cardiac issues.  RTC scheduled for May

## 2024-02-20 NOTE — Assessment & Plan Note (Signed)
 Dizziness: As described above, positional, getting worse.  Also having some tinnitus and hearing loss on the right side. No cardiopulmonary symptoms. Last CBC normal. Orthostatic signs: Negative Request a ENT referral which is appropriate, will arrange. Obesity, weight loss management: On Wegovy, today he reports has been taking Adipex for at least a year, on and off, apparently the dizziness does not change whether or not he is taking Adipex. Advised patient that in my opinion he needs to stop Adipex and continue Wegovy due to potential adverse effects from Adipex including tachycardia and other cardiac issues.  RTC scheduled for May

## 2024-03-17 ENCOUNTER — Ambulatory Visit: Payer: Managed Care, Other (non HMO) | Admitting: Internal Medicine

## 2024-04-10 IMAGING — DX DG ABDOMEN 2V
3 series · 3 of 3 positions shown · non-contrast
Comparison: None Available.

CLINICAL DATA: Abdominal pain and distention.

EXAM:
ABDOMEN - 2 VIEW

[abdomen erect]
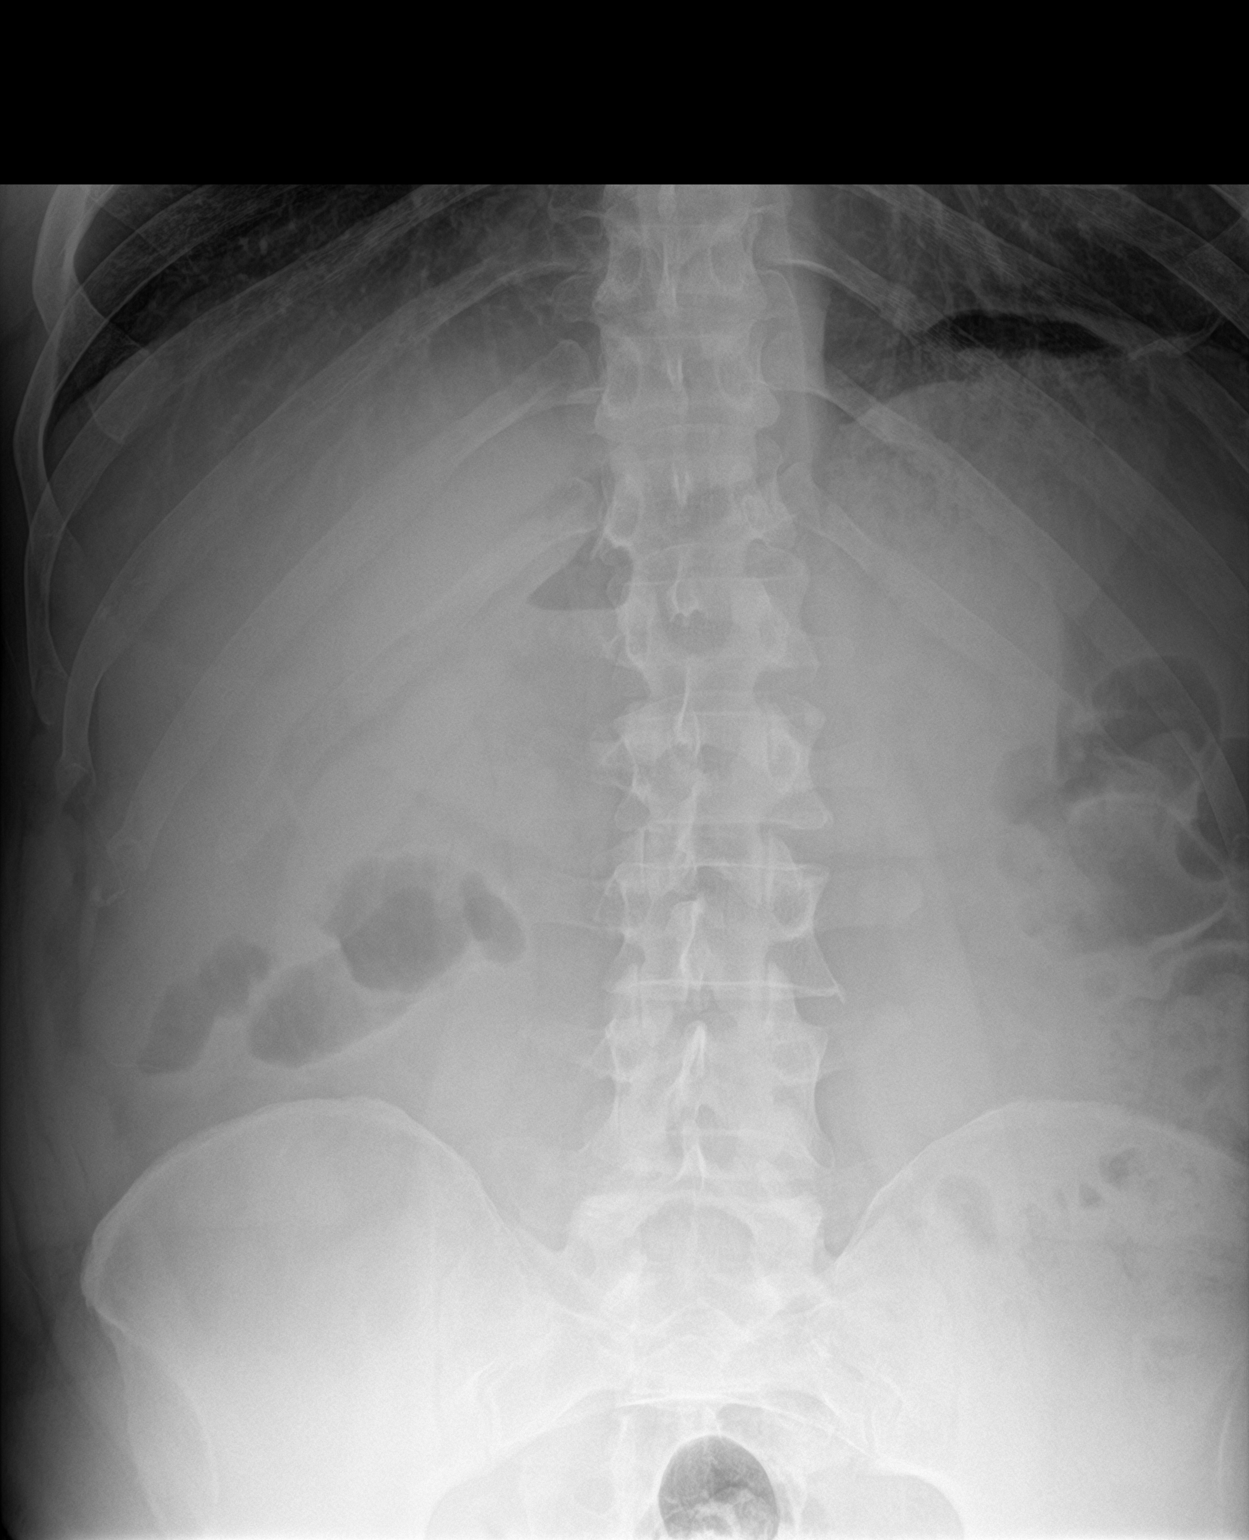

[abdomen supine (1 of 2)]
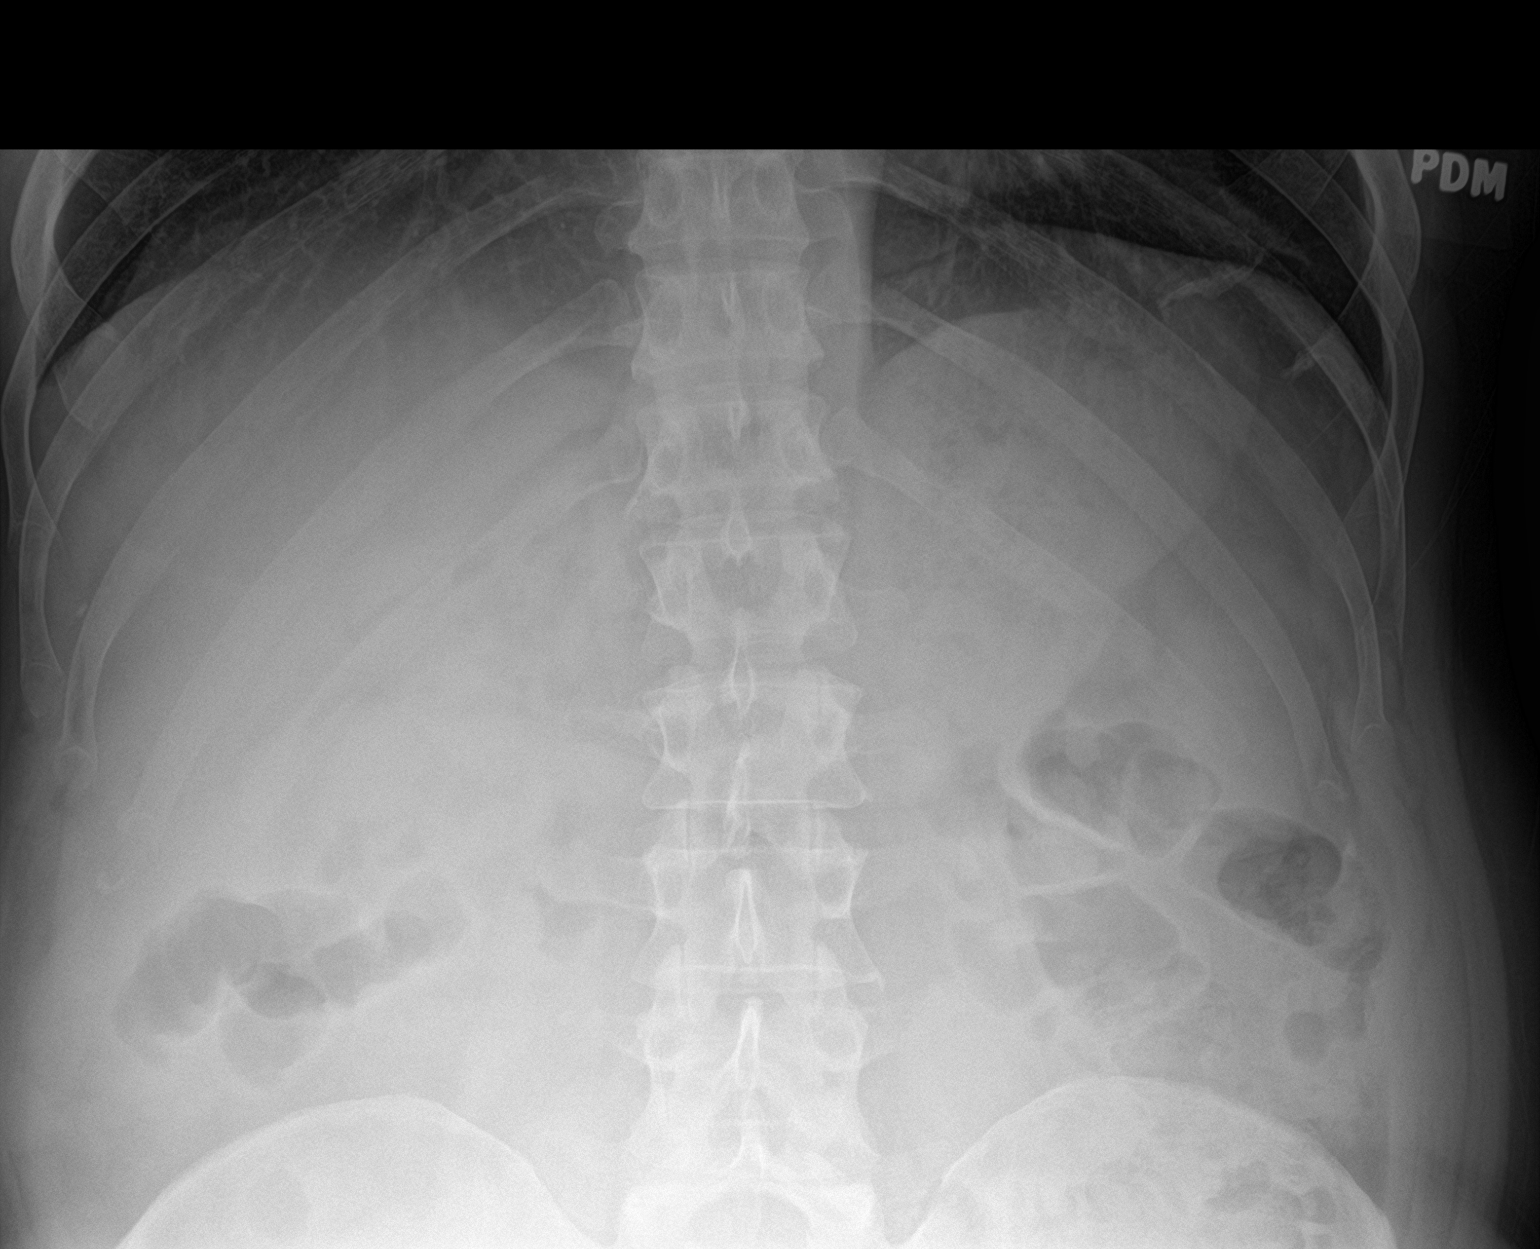

[abdomen supine (2 of 2)]
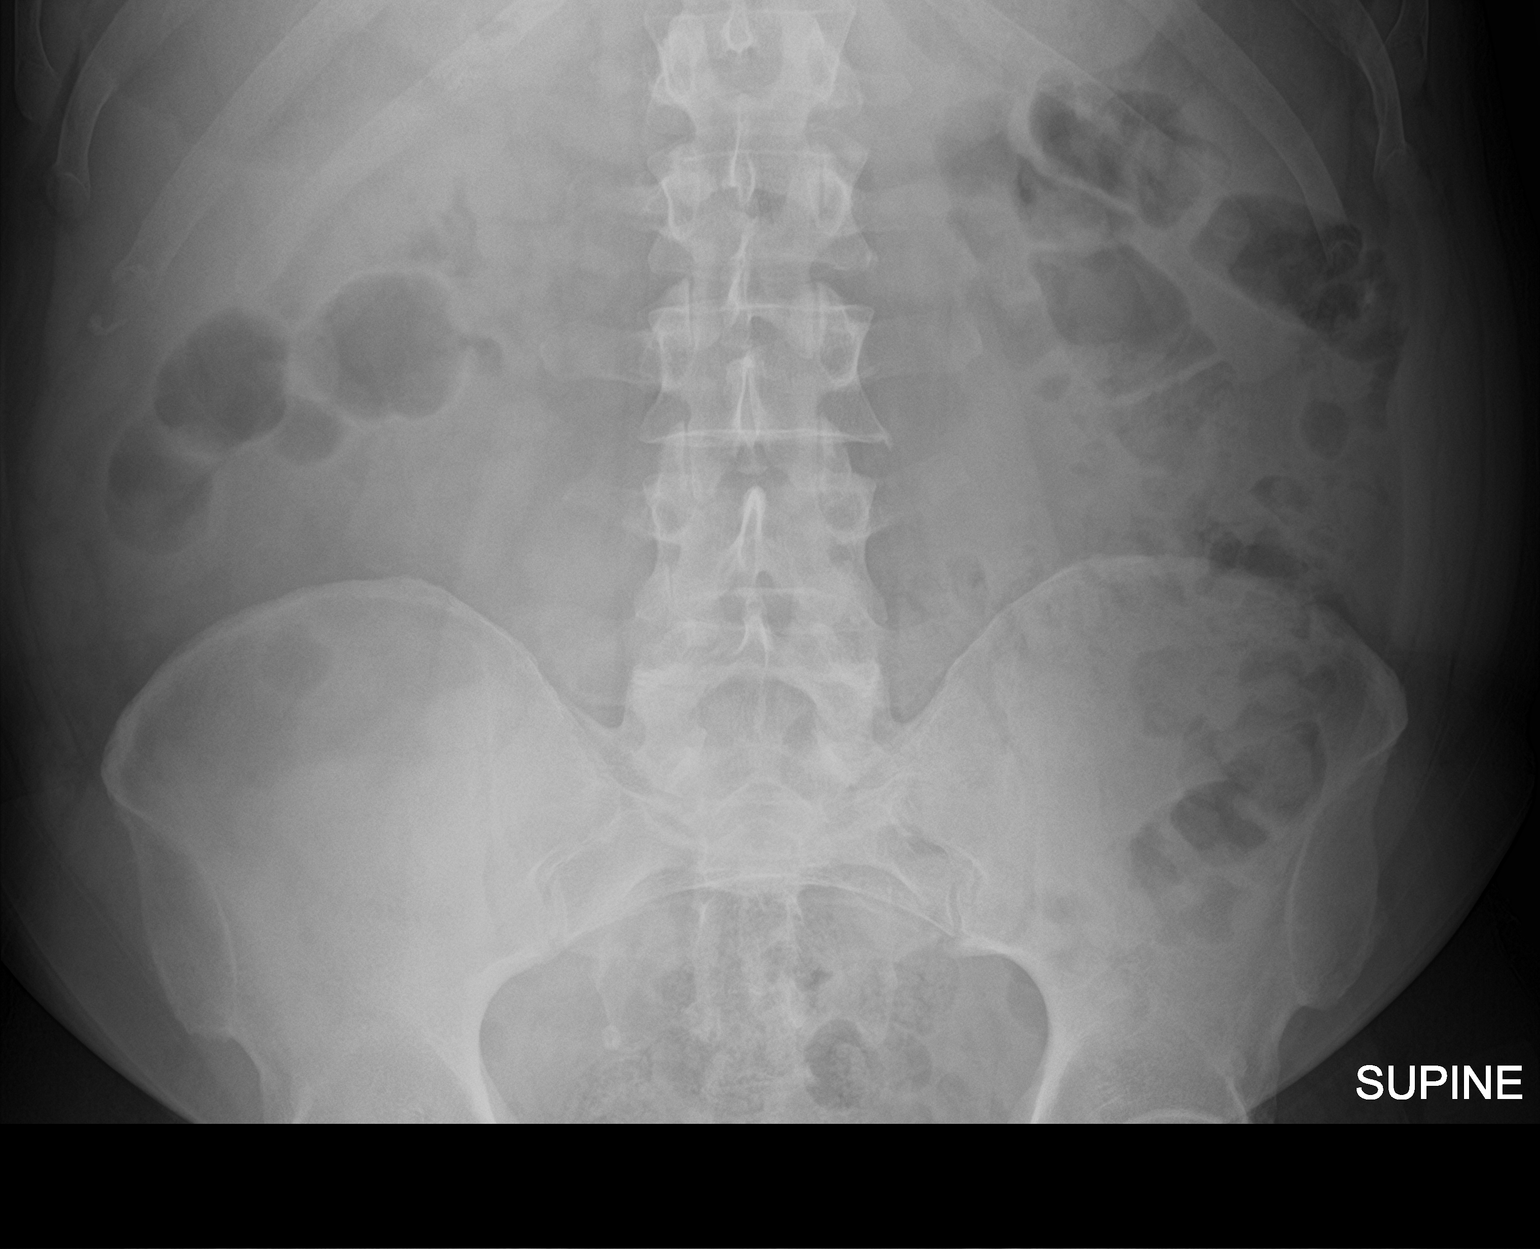

[3 of 3 positions shown; findings below may reference images not displayed]

FINDINGS: The bowel gas pattern is normal. There is no evidence of free air.
No radio-opaque calculi or other significant radiographic
abnormality is seen.
IMPRESSION: Negative.

## 2024-04-16 ENCOUNTER — Other Ambulatory Visit: Payer: Self-pay | Admitting: Internal Medicine

## 2024-04-17 ENCOUNTER — Other Ambulatory Visit (HOSPITAL_BASED_OUTPATIENT_CLINIC_OR_DEPARTMENT_OTHER): Payer: Self-pay

## 2024-04-17 ENCOUNTER — Other Ambulatory Visit: Payer: Self-pay

## 2024-04-17 MED ORDER — WEGOVY 2.4 MG/0.75ML ~~LOC~~ SOAJ
2.4000 mg | SUBCUTANEOUS | 3 refills | Status: DC
  Filled 2024-04-17: qty 3, 28d supply, fill #0
  Filled 2024-05-11: qty 3, 28d supply, fill #1
  Filled 2024-06-10: qty 3, 28d supply, fill #2
  Filled 2024-07-10: qty 3, 28d supply, fill #3

## 2024-05-21 ENCOUNTER — Other Ambulatory Visit (HOSPITAL_BASED_OUTPATIENT_CLINIC_OR_DEPARTMENT_OTHER): Payer: Self-pay

## 2024-05-21 MED ORDER — PHENTERMINE HCL 37.5 MG PO TABS
37.5000 mg | ORAL_TABLET | Freq: Every morning | ORAL | 0 refills | Status: DC
  Filled 2024-05-21: qty 30, 30d supply, fill #0

## 2024-05-26 ENCOUNTER — Ambulatory Visit (INDEPENDENT_AMBULATORY_CARE_PROVIDER_SITE_OTHER): Admitting: Audiology

## 2024-05-26 ENCOUNTER — Encounter (INDEPENDENT_AMBULATORY_CARE_PROVIDER_SITE_OTHER): Payer: Self-pay | Admitting: Otolaryngology

## 2024-05-26 ENCOUNTER — Ambulatory Visit (INDEPENDENT_AMBULATORY_CARE_PROVIDER_SITE_OTHER): Admitting: Otolaryngology

## 2024-05-26 VITALS — BP 127/76 | HR 99 | Ht 70.5 in | Wt 185.0 lb

## 2024-05-26 DIAGNOSIS — Z0110 Encounter for examination of ears and hearing without abnormal findings: Secondary | ICD-10-CM

## 2024-05-26 DIAGNOSIS — H9201 Otalgia, right ear: Secondary | ICD-10-CM

## 2024-05-26 DIAGNOSIS — R42 Dizziness and giddiness: Secondary | ICD-10-CM

## 2024-05-26 DIAGNOSIS — H93293 Other abnormal auditory perceptions, bilateral: Secondary | ICD-10-CM

## 2024-05-26 NOTE — Progress Notes (Signed)
  [address], [address] [place], [place] [address] [phone]  Audiological Evaluation    Name: Carl Beltran     DOB:   1975-12-02      MRN:   [id number]                                                                                     Service Date: 05/26/2024     Accompanied by: wife   Patient comes today after Dr. Karis, ENT sent a referral for a hearing evaluation due to concerns with ear pain.   Symptoms Yes Details  Hearing loss  []    Tinnitus  []    Ear pain/ infections/pressure  [x]  Reports several ear infections, ear pain both ears  Balance problems  [x]  Comes and goes' drunk like' sensation with onset 6-9 months ago  Noise exposure history  []    Previous ear surgeries  []    Family history of hearing loss  []    Amplification  []    Other  []      Otoscopy: Right ear: Clear external ear canal and notable landmarks visualized on the tympanic membrane. Left ear:  Clear external ear canal and notable landmarks visualized on the tympanic membrane.  Tympanometry: Right ear: Type A- Normal external ear canal volume with normal middle ear pressure and tympanic membrane compliance. Left ear: Type A- Normal external ear canal volume with normal middle ear pressure and tympanic membrane compliance.    Pure tone Audiometry: Both ears- Normal hearing from [phone].   Speech Audiometry: Right ear- Speech Reception Threshold (SRT) was obtained at 5 dBHL. Left ear-Speech Reception Threshold (SRT) was obtained at 5 dBHL.   Word Recognition Score Tested using NU-6 (recorded) Right ear: 100% was obtained at a presentation level of 50 dBHL with contralateral masking which is deemed as  excellent. Left ear: 100% was obtained at a presentation level of 50 dBHL with contralateral masking which is deemed as  excellent.   The hearing test results were completed under headphones and results are deemed to be of good reliability. Test technique:  conventional       Recommendations: Follow up with ENT as scheduled for today. Return for a hearing evaluation if concerns with hearing changes arise or per MD recommendation.   Faron Tudisco MARIE LEROUX-MARTINEZ, AUD

## 2024-05-27 DIAGNOSIS — R42 Dizziness and giddiness: Secondary | ICD-10-CM | POA: Insufficient documentation

## 2024-05-27 DIAGNOSIS — H9201 Otalgia, right ear: Secondary | ICD-10-CM | POA: Insufficient documentation

## 2024-05-27 NOTE — Progress Notes (Signed)
 CC: Recurrent dizziness, right ear pain  HPI:  Carl Beltran is a 48 y.o. male who presents today for evaluation of his recurrent dizziness.  He has been symptomatic for more than 1 year.  He describes his dizziness as an intermittent lightheaded and off-balance sensation.  Each episode typically last for several seconds.  It is often triggered when he gets up from a supine or sitting position.  He denies any significant spinning vertigo.  He is usually symptomatic 4 to 5 days a week. Due to the brevity of the dizzy spells, it is not interfering with his daily activity.  In addition, he also complains of intermittent right ear pain.  He denies any otorrhea.  He has a history of recurrent ear infections.  However, he has no previous otologic surgery.  It should be noted that the patient has a history of tachycardia of unknown etiology ever since his COVID infection 3 years ago.  He was evaluated by a cardiologist.  No significant abnormality was noted.  Past Medical History:  Diagnosis Date   Adult ADHD 2017   [facility], predominantly inattentive presentation   Allergies    cats, hay   Attention deficit hyperactivity disorder (ADHD) 04/24/2016   Depressive personality disorder 2017   [facility]   Erectile dysfunction    History of colon polyps    distal sigmoid and rectal hyperplastic polyps   Sleep apnea    NO CPAP    Past Surgical History:  Procedure Laterality Date   COLONOSCOPY  11/29/2006   Dr.Gessner   KNEE CARTILAGE SURGERY Bilateral 09/20/2018   SHOULDER ARTHROSCOPY WITH BICEPS TENDON REPAIR Left 09/2021   UMBILICAL HERNIA REPAIR     umbilical, at age 33 y/o    Family History  Problem Relation Age of Onset   Tongue cancer Father    Headache Brother    CAD Other        GF   Colon cancer Neg Hx    Prostate cancer Neg Hx    Diabetes Neg Hx    Sleep apnea Neg Hx    Colon polyps Neg Hx    Stomach cancer Neg Hx    Rectal cancer Neg Hx      Social History:  reports that he has quit smoking. His smoking use included cigarettes and cigars. He has never used smokeless tobacco. He reports that he does not currently use alcohol. He reports that he does not use drugs.  Allergies: No Known Allergies  Prior to Admission medications   Medication Sig Start Date End Date Taking? Authorizing Provider  pantoprazole  (PROTONIX ) 20 MG tablet Take 1 tablet (20 mg total) by mouth daily. 02/19/24  Yes Paz, Aloysius BRAVO, MD  phentermine  (ADIPEX-P ) 37.5 MG tablet Take 37.5 mg by mouth every morning. 02/07/24  Yes [provider]  phentermine  (ADIPEX-P ) 37.5 MG tablet Take 1 tablet (37.5 mg) by mouth in the morning. 05/21/24  Yes   Semaglutide -Weight Management (WEGOVY ) 2.4 MG/0.75ML SOAJ Inject 2.4 mg into the skin once a week. 04/17/24  Yes Paz, Aloysius BRAVO, MD  sildenafil  (REVATIO ) 20 MG tablet TAKE 3-4 TABLETS BY MOUTH EVERY DAY AT BEDTIME AS NEEDED 02/12/24  Yes Amon Aloysius BRAVO, MD    Blood pressure 127/76, pulse 99, height 5' 10.5 (1.791 m), weight 185 lb (83.9 kg), SpO2 96%. Exam: General: Communicates without difficulty, well nourished, no acute distress. Head: Normocephalic, no evidence injury, no tenderness, facial buttresses intact without stepoff. Face/sinus: No tenderness to  palpation and percussion. Facial movement is normal and symmetric. Eyes: PERRL, EOMI. No scleral icterus, conjunctivae clear. Neuro: CN II exam reveals vision grossly intact.  No nystagmus at any point of gaze. Ears: Auricles well formed without lesions.  Ear canals are intact without mass or lesion.  No erythema or edema is appreciated.  The TMs are intact without fluid. Nose: External evaluation reveals normal support and skin without lesions.  Dorsum is intact.  Anterior rhinoscopy reveals normal mucosa over anterior aspect of inferior turbinates and intact septum.  No purulence noted. Oral:  Oral cavity and oropharynx are intact, symmetric, without erythema or edema.  Mucosa is  moist without lesions. Neck: Full range of motion without pain.  There is no significant lymphadenopathy.  No masses palpable.  Thyroid  bed within normal limits to palpation.  Parotid glands and submandibular glands equal bilaterally without mass.  Trachea is midline. Neuro:  CN 2-12 grossly intact. Vestibular: No nystagmus at any point of gaze. Dix Hallpike negative. Vestibular: There is no nystagmus with pneumatic pressure on either tympanic membrane or Valsalva. The cerebellar examination is unremarkable.   His hearing test shows normal hearing bilaterally across all frequencies.  Assessment: 1.  Referred right otalgia.  His ear canals, tympanic membranes, and middle ear spaces are normal bilaterally. 2.  Recurrent dizziness of unknown etiology.  His history is suggestive of POTS.  Other possible differential diagnoses include transient BPPV, vestibular migraine, Meniere's disease, peripheral vestibular dysfunction, or other central/systemic causes.  3.  Normal hearing bilaterally across all frequencies.  Plan: 1.  The physical exam findings are reviewed with the patient and his wife. 2.  The patient is reassured that no acute infection is noted today. 3.  The pathophysiology of vestibular dysfunction and dizziness are discussed extensively with the patient. The possible differential diagnoses are reviewed. Questions are invited and answered.  4.  If his symptoms worsen, he may benefit from undergoing physical therapy/vestibular rehabilitation to improve the balancing function.  5.  He may also benefit from vestibular neurodiagnostic testing at a tertiary care center to evaluate for possible vestibular dysfunction.   Yasseen Salls W Cythnia Osmun 05/27/2024, 9:19 AM

## 2024-06-02 ENCOUNTER — Encounter: Payer: Self-pay | Admitting: Audiology

## 2024-07-01 ENCOUNTER — Encounter: Payer: Self-pay | Admitting: Internal Medicine

## 2024-07-01 MED ORDER — SILDENAFIL CITRATE 20 MG PO TABS: ORAL_TABLET | ORAL | 0 refills | Status: DC

## 2024-07-01 NOTE — Telephone Encounter (Signed)
 Rx sent.

## 2024-07-03 ENCOUNTER — Other Ambulatory Visit (HOSPITAL_BASED_OUTPATIENT_CLINIC_OR_DEPARTMENT_OTHER): Payer: Self-pay

## 2024-07-03 MED ORDER — PHENTERMINE HCL 37.5 MG PO TABS
37.5000 mg | ORAL_TABLET | Freq: Every morning | ORAL | 0 refills | Status: DC
  Filled 2024-07-03: qty 30, 30d supply, fill #0

## 2024-08-07 ENCOUNTER — Other Ambulatory Visit: Payer: Self-pay | Admitting: Internal Medicine

## 2024-08-07 ENCOUNTER — Other Ambulatory Visit: Payer: Self-pay

## 2024-08-07 ENCOUNTER — Other Ambulatory Visit (HOSPITAL_BASED_OUTPATIENT_CLINIC_OR_DEPARTMENT_OTHER): Payer: Self-pay

## 2024-08-07 MED ORDER — PHENTERMINE HCL 37.5 MG PO TABS
37.5000 mg | ORAL_TABLET | Freq: Every morning | ORAL | 0 refills | Status: DC
  Filled 2024-08-07: qty 30, 30d supply, fill #0

## 2024-08-07 MED ORDER — WEGOVY 2.4 MG/0.75ML ~~LOC~~ SOAJ
2.4000 mg | SUBCUTANEOUS | 3 refills | Status: DC
  Filled 2024-08-07: qty 3, 28d supply, fill #0
  Filled 2024-09-03: qty 3, 28d supply, fill #1
  Filled 2024-09-29: qty 3, 28d supply, fill #2

## 2024-08-07 MED ORDER — PANTOPRAZOLE SODIUM 20 MG PO TBEC
20.0000 mg | DELAYED_RELEASE_TABLET | Freq: Every day | ORAL | 1 refills | Status: AC
  Filled 2024-08-07: qty 30, 30d supply, fill #0
  Filled 2024-09-03: qty 30, 30d supply, fill #1
  Filled 2024-09-29: qty 30, 30d supply, fill #2
  Filled 2024-10-27: qty 30, 30d supply, fill #3
  Filled 2024-11-25: qty 30, 30d supply, fill #4

## 2024-08-22 ENCOUNTER — Other Ambulatory Visit (HOSPITAL_COMMUNITY): Payer: Self-pay

## 2024-09-03 ENCOUNTER — Other Ambulatory Visit (HOSPITAL_BASED_OUTPATIENT_CLINIC_OR_DEPARTMENT_OTHER): Payer: Self-pay

## 2024-09-03 MED ORDER — PHENTERMINE HCL 37.5 MG PO TABS
37.5000 mg | ORAL_TABLET | Freq: Every morning | ORAL | 0 refills | Status: DC
  Filled 2024-09-05: qty 30, 30d supply, fill #0

## 2024-09-05 ENCOUNTER — Other Ambulatory Visit (HOSPITAL_BASED_OUTPATIENT_CLINIC_OR_DEPARTMENT_OTHER): Payer: Self-pay

## 2024-09-19 ENCOUNTER — Encounter: Admitting: Internal Medicine

## 2024-09-29 ENCOUNTER — Other Ambulatory Visit (HOSPITAL_BASED_OUTPATIENT_CLINIC_OR_DEPARTMENT_OTHER): Payer: Self-pay

## 2024-09-29 ENCOUNTER — Encounter: Payer: Self-pay | Admitting: Internal Medicine

## 2024-09-29 MED ORDER — PHENTERMINE HCL 37.5 MG PO TABS
37.5000 mg | ORAL_TABLET | Freq: Every morning | ORAL | 0 refills | Status: DC
  Filled 2024-09-29 – 2024-10-01 (×2): qty 30, 30d supply, fill #0

## 2024-09-30 ENCOUNTER — Other Ambulatory Visit: Payer: Self-pay

## 2024-09-30 ENCOUNTER — Other Ambulatory Visit (HOSPITAL_BASED_OUTPATIENT_CLINIC_OR_DEPARTMENT_OTHER): Payer: Self-pay

## 2024-09-30 ENCOUNTER — Other Ambulatory Visit: Payer: Self-pay | Admitting: Family

## 2024-09-30 MED ORDER — TADALAFIL 5 MG PO TABS: 5.0000 mg | ORAL_TABLET | Freq: Every day | ORAL | 11 refills | Status: DC

## 2024-09-30 MED ORDER — TADALAFIL 5 MG PO TABS
5.0000 mg | ORAL_TABLET | Freq: Every day | ORAL | 11 refills | Status: AC
  Filled 2024-09-30: qty 30, 30d supply, fill #0
  Filled 2024-10-27: qty 30, 30d supply, fill #1
  Filled 2024-11-25: qty 30, 30d supply, fill #2

## 2024-10-01 ENCOUNTER — Other Ambulatory Visit (HOSPITAL_BASED_OUTPATIENT_CLINIC_OR_DEPARTMENT_OTHER): Payer: Self-pay

## 2024-10-01 ENCOUNTER — Encounter: Payer: Self-pay | Admitting: Internal Medicine

## 2024-10-01 ENCOUNTER — Telehealth: Payer: Self-pay | Admitting: Internal Medicine

## 2024-10-01 NOTE — Telephone Encounter (Signed)
 Pt is sched for an ov with Padonda Webb on 10/13/24 for his Wegovy  PA. He wants to know if there is any way for him to get his medication until this appointment. Please advise

## 2024-10-01 NOTE — Telephone Encounter (Signed)
 Duplicate message

## 2024-10-01 NOTE — Telephone Encounter (Signed)
 See mychart message, as explained to Pt- unable to do PA for med until we have an OV w/ his weight to send to his insurance.

## 2024-10-03 ENCOUNTER — Other Ambulatory Visit (HOSPITAL_BASED_OUTPATIENT_CLINIC_OR_DEPARTMENT_OTHER): Payer: Self-pay

## 2024-10-04 ENCOUNTER — Other Ambulatory Visit (HOSPITAL_BASED_OUTPATIENT_CLINIC_OR_DEPARTMENT_OTHER): Payer: Self-pay

## 2024-10-06 ENCOUNTER — Other Ambulatory Visit (HOSPITAL_BASED_OUTPATIENT_CLINIC_OR_DEPARTMENT_OTHER): Payer: Self-pay

## 2024-10-07 ENCOUNTER — Other Ambulatory Visit (HOSPITAL_BASED_OUTPATIENT_CLINIC_OR_DEPARTMENT_OTHER): Payer: Self-pay

## 2024-10-09 ENCOUNTER — Other Ambulatory Visit (HOSPITAL_BASED_OUTPATIENT_CLINIC_OR_DEPARTMENT_OTHER): Payer: Self-pay

## 2024-10-10 ENCOUNTER — Other Ambulatory Visit (HOSPITAL_BASED_OUTPATIENT_CLINIC_OR_DEPARTMENT_OTHER): Payer: Self-pay

## 2024-10-13 ENCOUNTER — Other Ambulatory Visit (HOSPITAL_COMMUNITY): Payer: Self-pay

## 2024-10-13 ENCOUNTER — Ambulatory Visit: Admitting: Family

## 2024-10-13 ENCOUNTER — Telehealth: Payer: Self-pay

## 2024-10-13 ENCOUNTER — Other Ambulatory Visit (HOSPITAL_BASED_OUTPATIENT_CLINIC_OR_DEPARTMENT_OTHER): Payer: Self-pay

## 2024-10-13 ENCOUNTER — Encounter: Payer: Self-pay | Admitting: Family

## 2024-10-13 VITALS — BP 116/78 | HR 84 | Temp 96.4°F | Resp 16 | Ht 71.0 in | Wt 189.2 lb

## 2024-10-13 DIAGNOSIS — G4733 Obstructive sleep apnea (adult) (pediatric): Secondary | ICD-10-CM

## 2024-10-13 DIAGNOSIS — E669 Obesity, unspecified: Secondary | ICD-10-CM

## 2024-10-13 DIAGNOSIS — R7989 Other specified abnormal findings of blood chemistry: Secondary | ICD-10-CM

## 2024-10-13 DIAGNOSIS — Z79899 Other long term (current) drug therapy: Secondary | ICD-10-CM

## 2024-10-13 LAB — CBC WITH DIFFERENTIAL/PLATELET
Basophils Absolute: 0.1 K/uL (ref 0.0–0.1)
Basophils Relative: 1 % (ref 0.0–3.0)
Eosinophils Absolute: 0.1 K/uL (ref 0.0–0.7)
Eosinophils Relative: 1.4 % (ref 0.0–5.0)
HCT: 46.6 % (ref 39.0–52.0)
Hemoglobin: 15.6 g/dL (ref 13.0–17.0)
Lymphocytes Relative: 28.2 % (ref 12.0–46.0)
Lymphs Abs: 1.6 K/uL (ref 0.7–4.0)
MCHC: 33.5 g/dL (ref 30.0–36.0)
MCV: 88.7 fl (ref 78.0–100.0)
Monocytes Absolute: 0.4 K/uL (ref 0.1–1.0)
Monocytes Relative: 7.7 % (ref 3.0–12.0)
Neutro Abs: 3.5 K/uL (ref 1.4–7.7)
Neutrophils Relative %: 61.7 % (ref 43.0–77.0)
Platelets: 286 K/uL (ref 150.0–400.0)
RBC: 5.25 Mil/uL (ref 4.22–5.81)
RDW: 13.2 % (ref 11.5–15.5)
WBC: 5.7 K/uL (ref 4.0–10.5)

## 2024-10-13 LAB — COMPREHENSIVE METABOLIC PANEL WITH GFR
ALT: 22 U/L (ref 0–53)
AST: 20 U/L (ref 0–37)
Albumin: 4.5 g/dL (ref 3.5–5.2)
Alkaline Phosphatase: 49 U/L (ref 39–117)
BUN: 12 mg/dL (ref 6–23)
CO2: 31 meq/L (ref 19–32)
Calcium: 9.5 mg/dL (ref 8.4–10.5)
Chloride: 103 meq/L (ref 96–112)
Creatinine, Ser: 1.09 mg/dL (ref 0.40–1.50)
GFR: 80.32 mL/min (ref >60.00)
Glucose, Bld: 92 mg/dL (ref 70–99)
Potassium: 4.9 meq/L (ref 3.5–5.1)
Sodium: 142 meq/L (ref 135–145)
Total Bilirubin: 1 mg/dL (ref 0.2–1.2)
Total Protein: 6.7 g/dL (ref 6.0–8.3)

## 2024-10-13 LAB — PSA: PSA: 1.12 ng/mL (ref 0.10–4.00)

## 2024-10-13 LAB — LIPID PANEL
Cholesterol: 125 mg/dL (ref 0–200)
HDL: 38.1 mg/dL — ABNORMAL LOW (ref >39.00)
LDL Cholesterol: 71 mg/dL (ref 0–99)
NonHDL: 87.37
Total CHOL/HDL Ratio: 3
Triglycerides: 84 mg/dL (ref 0.0–149.0)
VLDL: 16.8 mg/dL (ref 0.0–40.0)

## 2024-10-13 LAB — TSH: TSH: 1.65 u[IU]/mL (ref 0.35–5.50)

## 2024-10-13 MED ORDER — WEGOVY 2.4 MG/0.75ML ~~LOC~~ SOAJ
2.4000 mg | SUBCUTANEOUS | 3 refills | Status: AC
  Filled 2024-10-13: qty 3, 28d supply, fill #0
  Filled 2024-10-27 – 2024-11-03 (×2): qty 3, 28d supply, fill #1
  Filled 2024-11-25: qty 3, 28d supply, fill #2

## 2024-10-13 NOTE — Telephone Encounter (Signed)
 Needs PA for Wegovy  please.

## 2024-10-13 NOTE — Telephone Encounter (Signed)
 Pharmacy Patient Advocate Encounter   Received notification from Pt Calls Messages that prior authorization for Wegovy  2.4mg /0.10ml  is required/requested.   Insurance verification completed.   The patient is insured through [facility].   Per test claim: PA required; PA submitted to above mentioned insurance via Latent Key/confirmation #/EOC [id number] Status is pending

## 2024-10-13 NOTE — Telephone Encounter (Signed)
 Pharmacy Patient Advocate Encounter  Received notification from [facility] that Prior Authorization for Wegovy  2.4mg /0.58ml has been APPROVED from 10/13/24 to 10/13/25   PA #/Case ID/Reference #: [id number]

## 2024-10-13 NOTE — Progress Notes (Signed)
 Established Patient Office Visit  Subjective   Patient ID: Carl Beltran, male    DOB: 1976/07/16  Age: 48 y.o. MRN: [id number]  Chief Complaint  Patient presents with  . Weight Check    Patient is here to discuss his weight journey with the provider ; Currently taking Wegovy     HPI  48 year old male presents today for recheck of his chronic conditions.  Has a history of obesity, obstructive sleep apnea, hypogonadism.  He is doing well.  Over the past year he has lost about 65 pounds he is on Wegovy  2.4 mg weekly and tolerates it well.  He needs a new prior authorization today.  Tries to follow a healthy diet. Review of Systems  Constitutional: Negative.   HENT: Negative.    Respiratory: Negative.    Cardiovascular: Negative.   Gastrointestinal: Negative.   Musculoskeletal: Negative.   Skin: Negative.   Neurological: Negative.   Endo/Heme/Allergies: Negative.   Psychiatric/Behavioral: Negative.    All other systems reviewed and are negative.  Past Medical History:  Diagnosis Date  . Adult ADHD 2017   [facility], predominantly inattentive presentation  . Allergies    cats, hay  . Attention deficit hyperactivity disorder (ADHD) 04/24/2016  . Depressive personality disorder 2017   [facility]  . Erectile dysfunction   . History of colon polyps    distal sigmoid and rectal hyperplastic polyps  . Sleep apnea    NO CPAP    Social History   Socioeconomic History  . Marital status: Married    Spouse name: Not on file  . Number of children: 2  . Years of education: Not on file  . Highest education level: Some college, no degree  Occupational History  . Occupation: IT    Employer: [employer]  Tobacco Use  . Smoking status: Former    Types: Cigarettes, Cigars  . Smokeless tobacco: Never  . Tobacco comments:    smoked while in college     Cigar rarely  Vaping Use  . Vaping status: Never Used  Substance and Sexual Activity  . Alcohol  use: Not Currently    Comment: one everyother week  . Drug use: No  . Sexual activity: Not on file  Other Topics Concern  . Not on file  Social History Narrative   Household-- pt , wife ,2009, 2006      Lives at home with wife & children   Right handed   Caffeine: 1 cup/day   Social Drivers of Corporate Investment Banker Strain: Low Risk  (10/12/2024)   Overall Financial Resource Strain (CARDIA)   . Difficulty of Paying Living Expenses: Not hard at all  Food Insecurity: No Food Insecurity (10/12/2024)   Hunger Vital Sign   . Worried About Programme Researcher, Broadcasting/film/video in the Last Year: Never true   . Ran Out of Food in the Last Year: Never true  Transportation Needs: No Transportation Needs (10/12/2024)   PRAPARE - Transportation   . Lack of Transportation (Medical): No   . Lack of Transportation (Non-Medical): No  Physical Activity: Insufficiently Active (10/12/2024)   Exercise Vital Sign   . Days of Exercise per Week: 3 days   . Minutes of Exercise per Session: 40 min  Stress: No Stress Concern Present (10/12/2024)   [employer] of Occupational Health - Occupational Stress Questionnaire   . Feeling of Stress: Only a little  Social Connections: Socially Integrated (10/12/2024)   Social Connection and Isolation  Panel   . Frequency of Communication with Friends and Family: Twice a week   . Frequency of Social Gatherings with Friends and Family: Once a week   . Attends Religious Services: More than 4 times per year   . Active Member of Clubs or Organizations: Yes   . Attends Banker Meetings: More than 4 times per year   . Marital Status: Married  Catering Manager Violence: Not on file    Past Surgical History:  Procedure Laterality Date  . COLONOSCOPY  11/29/2006   Dr.Gessner  . KNEE CARTILAGE SURGERY Bilateral 09/20/2018  . SHOULDER ARTHROSCOPY WITH BICEPS TENDON REPAIR Left 09/2021  . UMBILICAL HERNIA REPAIR     umbilical, at age 48 y/o    Family History   Problem Relation Age of Onset  . Tongue cancer Father   . Headache Brother   . CAD Other        GF  . Colon cancer Neg Hx   . Prostate cancer Neg Hx   . Diabetes Neg Hx   . Sleep apnea Neg Hx   . Colon polyps Neg Hx   . Stomach cancer Neg Hx   . Rectal cancer Neg Hx     No Known Allergies  Current Outpatient Medications on File Prior to Visit  Medication Sig Dispense Refill  . pantoprazole  (PROTONIX ) 20 MG tablet Take 1 tablet (20 mg total) by mouth daily. 90 tablet 1  . phentermine  (ADIPEX-P ) 37.5 MG tablet Take 37.5 mg by mouth every morning.    . tadalafil  (CIALIS ) 5 MG tablet Take 1 tablet (5 mg total) by mouth daily. 30 tablet 11   No current facility-administered medications on file prior to visit.    BP 116/78 (BP Location: Left Arm, Patient Position: Sitting, Cuff Size: Normal)   Pulse 84   Temp (!) 96.4 F (35.8 C) (Oral)   Resp 16   Ht 5' 11 (1.803 m)   Wt 189 lb 3.2 oz (85.8 kg)   SpO2 99%   BMI 26.39 kg/m chart    Objective:     BP 116/78 (BP Location: Left Arm, Patient Position: Sitting, Cuff Size: Normal)   Pulse 84   Temp (!) 96.4 F (35.8 C) (Oral)   Resp 16   Ht 5' 11 (1.803 m)   Wt 189 lb 3.2 oz (85.8 kg)   SpO2 99%   BMI 26.39 kg/m    Physical Exam Vitals and nursing note reviewed.  Constitutional:      Appearance: Normal appearance. He is normal weight.  HENT:     Right Ear: Tympanic membrane, ear canal and external ear normal.     Left Ear: Tympanic membrane, ear canal and external ear normal.     Mouth/Throat:     Mouth: Mucous membranes are moist.  Eyes:     Pupils: Pupils are equal, round, and reactive to light.  Cardiovascular:     Rate and Rhythm: Normal rate.  Pulmonary:     Effort: Pulmonary effort is normal.     Breath sounds: Normal breath sounds.  Abdominal:     General: Abdomen is flat. Bowel sounds are normal.     Palpations: Abdomen is soft.  Musculoskeletal:        General: Normal range of motion.      Cervical back: Normal range of motion and neck supple.  Skin:    General: Skin is warm and dry.  Neurological:     General: No focal deficit present.  Mental Status: He is alert and oriented to person, place, and time. Mental status is at baseline.  Psychiatric:        Mood and Affect: Mood normal.        Behavior: Behavior normal.        Thought Content: Thought content normal.     No results found for any visits on 10/13/24.    The ASCVD Risk score (Arnett DK, et al., 2019) failed to calculate for the following reasons:   The valid total cholesterol range is 130 to 320 mg/dL    Assessment & Plan:   Problem List Items Addressed This Visit     OSA (obstructive sleep apnea)   Relevant Orders   Testosterone ,Free and Total   TSH   Lipid panel   CBC w/Diff   COMPLETE METABOLIC PANEL WITH eGFR   Other Visit Diagnoses       Low testosterone  in male    -  Primary   Relevant Orders   Testosterone ,Free and Total   PSA   TSH   Lipid panel   CBC w/Diff   COMPLETE METABOLIC PANEL WITH eGFR     Medication management       Relevant Orders   Testosterone ,Free and Total   PSA   TSH   Lipid panel   CBC w/Diff   COMPLETE METABOLIC PANEL WITH eGFR     Obesity, unspecified class, unspecified obesity type, unspecified whether serious comorbidity present       Relevant Medications   semaglutide -weight management (WEGOVY ) 2.4 MG/0.75ML SOAJ SQ injection   Other Relevant Orders   Testosterone ,Free and Total   TSH   Lipid panel   CBC w/Diff   COMPLETE METABOLIC PANEL WITH eGFR      Call the office with any questions or concerns.  Will Taine labs today.  Recheck in 6 months and sooner as needed. No follow-ups on file.    Deshauna Cayson B Atlas Kuc, FNP

## 2024-10-14 ENCOUNTER — Ambulatory Visit: Payer: Self-pay | Admitting: Family

## 2024-10-15 LAB — TESTOSTERONE,FREE AND TOTAL
Testosterone, Free: 34.2 pg/mL — AB (ref 6.8–21.5)
Testosterone: 1354 ng/dL — ABNORMAL HIGH (ref 264–916)

## 2024-10-16 ENCOUNTER — Other Ambulatory Visit (HOSPITAL_COMMUNITY): Payer: Self-pay

## 2024-10-27 ENCOUNTER — Other Ambulatory Visit (HOSPITAL_BASED_OUTPATIENT_CLINIC_OR_DEPARTMENT_OTHER): Payer: Self-pay

## 2024-10-27 ENCOUNTER — Other Ambulatory Visit: Payer: Self-pay

## 2024-10-27 MED ORDER — PHENTERMINE HCL 37.5 MG PO TABS
37.5000 mg | ORAL_TABLET | Freq: Every morning | ORAL | 0 refills | Status: DC
  Filled 2024-10-27 – 2024-10-29 (×2): qty 30, 30d supply, fill #0

## 2024-10-29 ENCOUNTER — Other Ambulatory Visit (HOSPITAL_BASED_OUTPATIENT_CLINIC_OR_DEPARTMENT_OTHER): Payer: Self-pay

## 2024-10-29 ENCOUNTER — Other Ambulatory Visit: Payer: Self-pay

## 2024-11-08 ENCOUNTER — Other Ambulatory Visit: Payer: Self-pay | Admitting: Internal Medicine

## 2024-11-25 ENCOUNTER — Encounter: Payer: Self-pay | Admitting: Internal Medicine

## 2024-11-26 ENCOUNTER — Other Ambulatory Visit (HOSPITAL_BASED_OUTPATIENT_CLINIC_OR_DEPARTMENT_OTHER): Payer: Self-pay

## 2024-11-26 MED ORDER — PHENTERMINE HCL 37.5 MG PO TABS
37.5000 mg | ORAL_TABLET | Freq: Every morning | ORAL | 0 refills | Status: AC
  Filled 2024-11-26 – 2024-11-28 (×3): qty 30, 30d supply, fill #0

## 2024-11-28 ENCOUNTER — Other Ambulatory Visit (HOSPITAL_BASED_OUTPATIENT_CLINIC_OR_DEPARTMENT_OTHER): Payer: Self-pay
